# Patient Record
Sex: Male | Born: 1945
Health system: Southern US, Community
[De-identification: ages and names within clinical notes are randomized; demographics above are authoritative.]

## PROBLEM LIST (undated history)

## (undated) DIAGNOSIS — I10 Essential (primary) hypertension: Secondary | ICD-10-CM

## (undated) DIAGNOSIS — I252 Old myocardial infarction: Secondary | ICD-10-CM

## (undated) DIAGNOSIS — Z860100 Personal history of colon polyps, unspecified: Secondary | ICD-10-CM

## (undated) DIAGNOSIS — Z85828 Personal history of other malignant neoplasm of skin: Secondary | ICD-10-CM

## (undated) DIAGNOSIS — N400 Enlarged prostate without lower urinary tract symptoms: Secondary | ICD-10-CM

## (undated) DIAGNOSIS — Z8679 Personal history of other diseases of the circulatory system: Secondary | ICD-10-CM

## (undated) DIAGNOSIS — R319 Hematuria, unspecified: Secondary | ICD-10-CM

## (undated) DIAGNOSIS — E119 Type 2 diabetes mellitus without complications: Secondary | ICD-10-CM

## (undated) DIAGNOSIS — Z951 Presence of aortocoronary bypass graft: Secondary | ICD-10-CM

## (undated) DIAGNOSIS — R011 Cardiac murmur, unspecified: Secondary | ICD-10-CM

## (undated) DIAGNOSIS — N1330 Unspecified hydronephrosis: Secondary | ICD-10-CM

## (undated) DIAGNOSIS — B019 Varicella without complication: Secondary | ICD-10-CM

## (undated) DIAGNOSIS — N201 Calculus of ureter: Secondary | ICD-10-CM

## (undated) DIAGNOSIS — D4112 Neoplasm of uncertain behavior of left renal pelvis: Secondary | ICD-10-CM

## (undated) DIAGNOSIS — I2510 Atherosclerotic heart disease of native coronary artery without angina pectoris: Secondary | ICD-10-CM

## (undated) DIAGNOSIS — J302 Other seasonal allergic rhinitis: Secondary | ICD-10-CM

## (undated) DIAGNOSIS — I219 Acute myocardial infarction, unspecified: Secondary | ICD-10-CM

## (undated) DIAGNOSIS — J309 Allergic rhinitis, unspecified: Secondary | ICD-10-CM

## (undated) DIAGNOSIS — R001 Bradycardia, unspecified: Secondary | ICD-10-CM

## (undated) DIAGNOSIS — Z973 Presence of spectacles and contact lenses: Secondary | ICD-10-CM

## (undated) DIAGNOSIS — I4510 Unspecified right bundle-branch block: Secondary | ICD-10-CM

## (undated) DIAGNOSIS — E785 Hyperlipidemia, unspecified: Secondary | ICD-10-CM

## (undated) DIAGNOSIS — I509 Heart failure, unspecified: Secondary | ICD-10-CM

## (undated) DIAGNOSIS — Z9889 Other specified postprocedural states: Secondary | ICD-10-CM

## (undated) DIAGNOSIS — Z87442 Personal history of urinary calculi: Secondary | ICD-10-CM

## (undated) HISTORY — PX: WISDOM TOOTH EXTRACTION: SHX21

## (undated) HISTORY — DX: Atherosclerotic heart disease of native coronary artery without angina pectoris: I25.10

## (undated) HISTORY — PX: INGUINAL HERNIA REPAIR: SUR1180

## (undated) HISTORY — DX: Benign prostatic hyperplasia without lower urinary tract symptoms: N40.0

## (undated) HISTORY — PX: ROTATOR CUFF REPAIR: SHX139

## (undated) HISTORY — DX: Allergic rhinitis, unspecified: J30.9

## (undated) HISTORY — DX: Bradycardia, unspecified: R00.1

## (undated) HISTORY — PX: CARDIAC CATHETERIZATION: SHX172

## (undated) HISTORY — PX: CARDIAC ELECTROPHYSIOLOGY MAPPING AND ABLATION: SHX1292

## (undated) HISTORY — DX: Old myocardial infarction: I25.2

## (undated) HISTORY — PX: CORONARY ARTERY BYPASS GRAFT: SHX141

## (undated) HISTORY — DX: Essential (primary) hypertension: I10

## (undated) HISTORY — DX: Other seasonal allergic rhinitis: J30.2

## (undated) HISTORY — DX: Varicella without complication: B01.9

---

## 2001-08-04 ENCOUNTER — Encounter: Payer: Self-pay | Admitting: Internal Medicine

## 2001-08-04 DIAGNOSIS — I252 Old myocardial infarction: Secondary | ICD-10-CM | POA: Insufficient documentation

## 2001-08-04 HISTORY — DX: Old myocardial infarction: I25.2

## 2001-08-05 ENCOUNTER — Encounter: Payer: Self-pay | Admitting: Internal Medicine

## 2002-03-02 ENCOUNTER — Encounter: Payer: Self-pay | Admitting: Internal Medicine

## 2003-01-15 ENCOUNTER — Encounter: Payer: Self-pay | Admitting: Internal Medicine

## 2003-02-24 ENCOUNTER — Encounter: Payer: Self-pay | Admitting: Internal Medicine

## 2004-01-13 ENCOUNTER — Encounter: Payer: Self-pay | Admitting: Internal Medicine

## 2004-01-19 ENCOUNTER — Encounter: Payer: Self-pay | Admitting: Internal Medicine

## 2004-02-17 ENCOUNTER — Encounter: Payer: Self-pay | Admitting: Internal Medicine

## 2004-04-12 ENCOUNTER — Encounter: Payer: Self-pay | Admitting: Internal Medicine

## 2004-04-17 ENCOUNTER — Encounter: Payer: Self-pay | Admitting: Internal Medicine

## 2007-09-23 ENCOUNTER — Emergency Department (HOSPITAL_COMMUNITY): Admission: EM | Admit: 2007-09-23 | Discharge: 2007-09-23 | Payer: Self-pay | Admitting: Emergency Medicine

## 2009-02-02 ENCOUNTER — Encounter: Payer: Self-pay | Admitting: Internal Medicine

## 2009-03-05 HISTORY — PX: OTHER SURGICAL HISTORY: SHX169

## 2009-03-11 ENCOUNTER — Ambulatory Visit: Payer: Self-pay | Admitting: Internal Medicine

## 2009-03-11 DIAGNOSIS — I4892 Unspecified atrial flutter: Secondary | ICD-10-CM | POA: Insufficient documentation

## 2009-03-11 DIAGNOSIS — E785 Hyperlipidemia, unspecified: Secondary | ICD-10-CM | POA: Insufficient documentation

## 2009-03-11 DIAGNOSIS — I2510 Atherosclerotic heart disease of native coronary artery without angina pectoris: Secondary | ICD-10-CM | POA: Insufficient documentation

## 2009-03-17 ENCOUNTER — Encounter: Payer: Self-pay | Admitting: Internal Medicine

## 2009-03-23 ENCOUNTER — Encounter: Payer: Self-pay | Admitting: Internal Medicine

## 2009-03-23 ENCOUNTER — Telehealth (INDEPENDENT_AMBULATORY_CARE_PROVIDER_SITE_OTHER): Payer: Self-pay | Admitting: *Deleted

## 2009-04-08 ENCOUNTER — Encounter: Payer: Self-pay | Admitting: Internal Medicine

## 2009-04-08 ENCOUNTER — Encounter (INDEPENDENT_AMBULATORY_CARE_PROVIDER_SITE_OTHER): Payer: Self-pay | Admitting: Cardiology

## 2009-04-14 ENCOUNTER — Ambulatory Visit (HOSPITAL_COMMUNITY): Admission: RE | Admit: 2009-04-14 | Discharge: 2009-04-15 | Payer: Self-pay | Admitting: Internal Medicine

## 2009-04-14 ENCOUNTER — Ambulatory Visit: Payer: Self-pay | Admitting: Internal Medicine

## 2009-04-18 ENCOUNTER — Telehealth: Payer: Self-pay | Admitting: Internal Medicine

## 2009-05-18 ENCOUNTER — Ambulatory Visit: Payer: Self-pay | Admitting: Internal Medicine

## 2009-05-18 DIAGNOSIS — I10 Essential (primary) hypertension: Secondary | ICD-10-CM | POA: Insufficient documentation

## 2009-05-18 HISTORY — DX: Essential (primary) hypertension: I10

## 2010-04-04 NOTE — Letter (Signed)
Summary: ELectrophysiology/Ablation Procedure Instructions  [employer], Main Office  [address]   [place], [place] [address]   Phone: [phone]  Fax: [phone]     Electrophysiology/Ablation Procedure Instructions    You are scheduled for a(n) atrial flutter ablation on 04/14/09 at 7:30am with Dr. Johney Frame  1.  Please come to the Short Stay Center at [facility] at 6:00am on the day of your procedure.  2.  Come prepared to stay overnight.   Please bring your insurance cards and a list of your medications.  3.  Come to the [place] office on 04/07/09 for lab work.  The lab at [facility] is open from 8:30 AM to 1:30 PM and 2:30 PM to 5:00 PM.    You do not have to be fasting.  4.  Do not have anything to eat or drink after midnight the night before your procedure.  5.    All of your remaining medications may be taken with a small amount of water.  6.  Educational material received:  _____ EP   _____ Ablation   * Occasionally, EP studies and ablations can become lengthy.  Please make your family aware of this before your procedure starts.  Average time ranges from 2-8 hours for EP studies/ablations.  Your physician will locate your family after the procedure with the results.  * If you have any questions after you get home, please call the office at [phone]. Anselm Pancoast

## 2010-04-04 NOTE — Progress Notes (Signed)
Summary: Questions about going to the gym.  Phone Note Call from Patient Call back at Home Phone [phone]   Caller: Patient Summary of Call: Pt have questions about  going to the gym to excrise Initial call taken by: Judie Grieve,  April 18, 2009 11:23 AM  Follow-up for Phone Call        spoke with Dr Johney Frame  He wants him to wait one more week. Dennis Bast, RN, BSN  April 19, 2009 4:00 PM spoke with pt he is aware  Dennis Bast, RN, BSN  April 19, 2009 4:10 PM

## 2010-04-04 NOTE — Assessment & Plan Note (Signed)
Summary: nep/atrial flutter/afib/recurrant/ablation in 2003   Visit Type:  Initial Consult Referring Provider:  Everette Rank Primary Provider:  Carlyon Shadow, MD  CC:  nep/recurrent atrial flutter/ablation 2003.  Had symptoms a month or so ago.  Has symptoms with exercise.  History of Present Illness: Donald [name] is a pleasant 65 yo WM with CAD s/p CABG who presents today for EP consultation regarding atrial flutter.  He reports initially being diagnosed with atrial flutter in November.  He reports at that time, finding that his heart rate was "too high".  He reports symptomatic "heart racing" and fatigue.  He presenting to Dr Hoyle Barr office and was found to have atrial flutter.  He was placed on metoprolol and coumadin and is now referred for consideration of ablation.  He reports that despite medical therapy, he continues to have fatigue and palpitations.  He has found decreased exercise tolerance.  He denies CP, SOB, dizziness, presyncope, or syncope.  He is otherwise without complaint today.  Current Medications (verified): 1)  Vitamin B-12 250 Mcg Tabs (Cyanocobalamin) .... Take One Tablet Once Daily 2)  Vitamin C 500 Mg Tabs (Ascorbic Acid) .... Take One Tablet Once Daily 3)  Aspirin 81 Mg Tabs (Aspirin) .... Once Daily 4)  Multivitamins  Tabs (Multiple Vitamin) .... Once Daily 5)  Ra Fish Oil 1000 Mg Caps (Omega-3 Fatty Acids) .Marland Kitchen.. 1 Capsule Once Daily 6)  Metoprolol Tartrate 50 Mg Tabs (Metoprolol Tartrate) .... Take One Tablet Two Times A Day 7)  Simvastatin 20 Mg Tabs (Simvastatin) .... Take One Tablet Every Once A Day  Allergies (verified): No Known Drug Allergies  Past History:  Social History: Last updated: 03/11/2009 Pt lives in [place].  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  Moved to [place] to be near children/ grandchildren. Alcohol Use - no Regular Exercise - yes Married- 2 children Tobacco Use - Yes.  Smokeless Tobacco  Past Medical  History: Coronary artery disease s/p CABG following MI 07/2001 (in [place]) Per report, atrial flutter ablation 2005 HL DJD  Past Surgical History: CABG 2003 Ablation 2005 R hand ORIF 2009 for arthritis shoulder surgery 04, 07  Family History: CAD  Social History: Pt lives in [place].  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  Moved to [place] to be near children/ grandchildren. Alcohol Use - no Regular Exercise - yes Married- 2 children Tobacco Use - Yes.  Smokeless Tobacco  Review of Systems       All systems are reviewed and negative except as listed in the HPI.   Vital Signs:  Patient profile:   65 year old male Height:      64 inches Weight:      180 pounds BMI:     31.01 Pulse rate:   103 / minute BP sitting:   126 / 74  (left arm) Cuff size:   regular  Vitals Entered By: Donald [name] CMA (March 11, 2009 8:52 AM)  Physical Exam  General:  Well developed, well nourished, in no acute distress. Head:  normocephalic and atraumatic Eyes:  PERRLA/EOM intact; conjunctiva and lids normal. Nose:  no deformity, discharge, inflammation, or lesions Mouth:  Teeth, gums and palate normal. Oral mucosa normal. Neck:  Neck supple, no JVD. No masses, thyromegaly or abnormal cervical nodes. Lungs:  Clear bilaterally to auscultation and percussion. Heart:  Non-displaced PMI, chest non-tender; regular rate and rhythm, S1, S2 without murmurs, rubs or gallops. Carotid upstroke normal, no bruit. Normal abdominal aortic size, no bruits.  Femorals normal pulses, no bruits. Pedals normal pulses. No edema, no varicosities. Abdomen:  Bowel sounds positive; abdomen soft and non-tender without masses, organomegaly, or hernias noted. No hepatosplenomegaly. Msk:  Back normal, normal gait. Muscle strength and tone normal. Pulses:  pulses normal in all 4 extremities Extremities:  No clubbing or cyanosis. Neurologic:  Alert and oriented x 3. Skin:  Intact without lesions or  rashes. Cervical Nodes:  no significant adenopathy Psych:  Normal affect.   EKG  Procedure date:  03/11/2009  Findings:      atrial flutter (typical appearing), RBBB, nonspecific ST/T changes V rate 103  Impression & Recommendations:  Problem # 1:  ATRIAL FLUTTER (ICD-427.32) The patient presents today for EP consultation regarding typical appearing, symptomatic atrial flutter.  Therapeutic strategies for atrial flutter including medicine and ablation were discussed in detail with the patient today. Risk, benefits, and alternatives to EP study and radiofrequency ablation were also discussed in detail today. These risks include but are not limited to stroke, bleeding, vascular damage, tamponade, perforation, damage to normal conduction requiring a pacemaker, and death. The patient understands these risk and wishes to proceed.   The patient (per report) has had prior ablation.  We will attempt to obtain records from [facility].  I will also plan to perform 3 D mapping with carto to evaluate his atrial flutter.  We will proceed once INRs have been therapeutic for 4 wks.  Problem # 2:  CAD (ICD-414.00)  No sypmtoms of ischemia No changes today His updated medication list for this problem includes:    Aspirin 81 Mg Tabs (Aspirin) ..... Once daily    Metoprolol Tartrate 50 Mg Tabs (Metoprolol tartrate) .Marland Kitchen... Take one tablet two times a day  His updated medication list for this problem includes:    Aspirin 81 Mg Tabs (Aspirin) ..... Once daily    Metoprolol Tartrate 50 Mg Tabs (Metoprolol tartrate) .Marland Kitchen... Take one tablet two times a day  Problem # 3:  HYPERLIPIDEMIA (ICD-272.4)  stable, no changes  His updated medication list for this problem includes:    Simvastatin 20 Mg Tabs (Simvastatin) .Marland Kitchen... Take one tablet every once a day  His updated medication list for this problem includes:    Simvastatin 20 Mg Tabs (Simvastatin) .Marland Kitchen... Take one tablet every once a day  Patient  Instructions: 1)  Your physician has recommended that you have an ablation.  Catheter ablation is a medical procedure used to treat some cardiac arrhythmias (irregular heartbeats). During catheter ablation, a long, thin, flexible tube is put into a blood vessel in your groin (upper thigh), or neck. This tube is called an ablation catheter. It is then guided to your heart through the blood vessel. Radiofrequency waves destroy small areas of heart tissue where abnormal heartbeats may cause an arrhythmia to start.  Please see the instruction sheet given to you today. 2)  Your physician recommends that you CONTINUE TO HAVE INR CHECKS WEEKLY AND YOUR INR NEEDS TO REMAIN BETWEEN 2 AND 3 DURING THIS TIME. 3)  CONTACT KELLY, RN TO SCHEDULE YOUR ABLATION ONCE YOUR HAVE HAD 3 CONSECUTIVE INR'S IN THE SPECIFIED RANGE.

## 2010-04-04 NOTE — Letter (Signed)
Summary: [facility]  [facility]   Imported By: Kassie Mends 04/27/2009 07:56:52  _____________________________________________________________________  External Attachment:    Type:   Image     Comment:   External Document

## 2010-04-04 NOTE — Cardiovascular Report (Signed)
Summary: [facility]   Imported By: Marylou Mccoy 04/13/2009 14:10:43  _____________________________________________________________________  External Attachment:    Type:   Image     Comment:   External Document

## 2010-04-04 NOTE — Progress Notes (Signed)
  FAxed ROI over 1/7, also called and left Message for [facility] @ front Desk asking that Records befaxed over or Mailed out. Cala Bradford Mesiemore  March 23, 2009 2:01 PM    Appended Document:  Recieved Records today, Forwarded to [place] for [place].  Appended Document:  Recieved REcords from [facility] in [place]

## 2010-04-04 NOTE — Letter (Signed)
Summary: [facility]   Imported By: Marylou Mccoy 04/13/2009 15:19:24  _____________________________________________________________________  External Attachment:    Type:   Image     Comment:   External Document

## 2010-04-04 NOTE — Assessment & Plan Note (Signed)
Summary: Donald [name]   Visit Type:  Follow-up Referring Provider:  Everette Rank Primary Provider:  Carlyon Shadow, MD   History of Present Illness: The patient presents today for routine electrophysiology followup. He reports doing very well since his atrial flutter ablation. The patient denies symptoms of palpitations, chest pain, shortness of breath, orthopnea, PND, lower extremity edema, dizziness, presyncope, syncope, or neurologic sequela.  He has had no further symptoms of atrial flutter.  He has resumed his previous activities without difficulty. The patient is tolerating medications without difficulties and is otherwise without complaint today.   Current Medications (verified): 1)  Vitamin B-12 250 Mcg Tabs (Cyanocobalamin) .... Take One Tablet Once Daily 2)  Vitamin C 500 Mg Tabs (Ascorbic Acid) .... Take One Tablet Once Daily 3)  Aspirin 81 Mg Tabs (Aspirin) .... Once Daily 4)  Multivitamins  Tabs (Multiple Vitamin) .... Once Daily 5)  Ra Fish Oil 1000 Mg Caps (Omega-3 Fatty Acids) .Marland Kitchen.. 1 Capsule Once Daily 6)  Metoprolol Tartrate 25 Mg Tabs (Metoprolol Tartrate) .... Take One Tablet By Mouth Twice A Day 7)  Simvastatin 20 Mg Tabs (Simvastatin) .... Take One Tablet Once A Day  Allergies (verified): No Known Drug Allergies  Past History:  Past Medical History: Coronary artery disease s/p CABG following MI 07/2001 (in [place]) Isthmus dependant RA flutter s/p CTI ablation 2005, 04/14/09 HTN HL DJD  Past Surgical History: CABG 2003 Ablation 2005, 2010 R hand ORIF 2009 for arthritis shoulder surgery 04, 07  Social History: Reviewed history from 03/11/2009 and no changes required. Pt lives in [place].  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  Moved to [place] to be near children/ grandchildren. Alcohol Use - no Regular Exercise - yes Married- 2 children Tobacco Use - Yes.  Smokeless Tobacco  Review of Systems       All systems are reviewed and negative  except as listed in the HPI.   Vital Signs:  Patient profile:   65 year old male Height:      64 inches Weight:      180 pounds BMI:     31.01 Pulse rate:   48 / minute BP sitting:   140 / 66  (left arm)  Vitals Entered By: Laurance Flatten CMA (May 18, 2009 8:48 AM)  Physical Exam  General:  Well developed, well nourished, in no acute distress. Head:  normocephalic and atraumatic Eyes:  PERRLA/EOM intact; conjunctiva and lids normal. Mouth:  Teeth, gums and palate normal. Oral mucosa normal. Neck:  Neck supple, no JVD. No masses, thyromegaly or abnormal cervical nodes. Lungs:  Clear bilaterally to auscultation and percussion. Heart:  Non-displaced PMI, chest non-tender; regular rate and rhythm, S1, S2 without murmurs, rubs or gallops. Carotid upstroke normal, no bruit. Normal abdominal aortic size, no bruits. Femorals normal pulses, no bruits. Pedals normal pulses. No edema, no varicosities. Abdomen:  Bowel sounds positive; abdomen soft and non-tender without masses, organomegaly, or hernias noted. No hepatosplenomegaly. Msk:  Back normal, normal gait. Muscle strength and tone normal. Pulses:  pulses normal in all 4 extremities Extremities:  No clubbing or cyanosis. Neurologic:  Alert and oriented x 3. Skin:  Intact without lesions or rashes. Psych:  Normal affect.   EKG  Procedure date:  05/18/2009  Findings:      sinus bradycardia 48 bpm, incomplete RBBB  Impression & Recommendations:  Problem # 1:  ATRIAL FLUTTER (ICD-427.32) Doing well s/p ablation without futher atrial flutter We will stop coumadin today If he develops atrial  fibrillation or further atrial flutters, then coumadin should be restarted at that time.  His updated medication list for this problem includes:    Aspirin 81 Mg Tabs (Aspirin) ..... Once daily    Metoprolol Tartrate 25 Mg Tabs (Metoprolol tartrate) .Marland Kitchen... Take one tablet by mouth twice a day  Problem # 2:  CAD (ICD-414.00)  Doing well  without symptoms of ischemia  His updated medication list for this problem includes:    Aspirin 81 Mg Tabs (Aspirin) ..... Once daily    Metoprolol Tartrate 25 Mg Tabs (Metoprolol tartrate) .Marland Kitchen... Take one-half tablet by mouth twice a day  His updated medication list for this problem includes:    Aspirin 81 Mg Tabs (Aspirin) ..... Once daily    Metoprolol Tartrate 25 Mg Tabs (Metoprolol tartrate) .Marland Kitchen... Take one-half tablet by mouth twice a day  Problem # 3:  HYPERTENSION, MILD (ICD-401.1)  repeat today 130/78 we will decrease metoprolol to 12.5mg  two times a day today due to bradycardia Pt will follow-up with Dr Eldridge Dace  His updated medication list for this problem includes:    Aspirin 81 Mg Tabs (Aspirin) ..... Once daily    Metoprolol Tartrate 25 Mg Tabs (Metoprolol tartrate) .Marland Kitchen... Take one-half tablet by mouth twice a day  His updated medication list for this problem includes:    Aspirin 81 Mg Tabs (Aspirin) ..... Once daily    Metoprolol Tartrate 25 Mg Tabs (Metoprolol tartrate) .Marland Kitchen... Take one-half tablet by mouth twice a day  Other Orders: EKG w/ Interpretation (93000)  Patient Instructions: 1)  Your physician recommends that you schedule a follow-up appointment with Dr Eldridge Dace 2)  Your physiician has recommended you make the following change in your medication: stop Coumadin decrease Metoprolol to 12.5mg  two times a day

## 2010-04-04 NOTE — Op Note (Signed)
Summary: [facility]   Imported By: Marylou Mccoy 04/13/2009 12:22:48  _____________________________________________________________________  External Attachment:    Type:   Image     Comment:   External Document

## 2010-05-24 LAB — PROTIME-INR
INR: 2.79 — ABNORMAL HIGH (ref 0.00–1.49)
INR: 2.89 — ABNORMAL HIGH (ref 0.00–1.49)
Prothrombin Time: 29.2 seconds — ABNORMAL HIGH (ref 11.6–15.2)
Prothrombin Time: 30 seconds — ABNORMAL HIGH (ref 11.6–15.2)

## 2010-12-01 LAB — URINALYSIS, ROUTINE W REFLEX MICROSCOPIC
Bilirubin Urine: NEGATIVE
Glucose, UA: NEGATIVE
Hgb urine dipstick: NEGATIVE
Ketones, ur: NEGATIVE
Nitrite: NEGATIVE
Protein, ur: NEGATIVE
Specific Gravity, Urine: 1.016
Urobilinogen, UA: 0.2
pH: 5.5

## 2010-12-01 LAB — BASIC METABOLIC PANEL
BUN: 13
CO2: 27
Calcium: 9.3
Chloride: 105
Creatinine, Ser: 0.91
GFR calc Af Amer: >60
GFR calc non Af Amer: >60
Glucose, Bld: 107 — ABNORMAL HIGH
Potassium: 4.6
Sodium: 141

## 2010-12-01 LAB — CBC
HCT: 40.9
Hemoglobin: 13.9
MCHC: 34
MCV: 90.4
Platelets: 160
RBC: 4.53
RDW: 13.2
WBC: 5.3

## 2010-12-01 LAB — CK: Total CK: 161

## 2010-12-01 LAB — DIFFERENTIAL
Basophils Absolute: 0.1
Basophils Relative: 2 — ABNORMAL HIGH
Eosinophils Absolute: 0.2
Eosinophils Relative: 4
Lymphocytes Relative: 35
Lymphs Abs: 1.9
Monocytes Absolute: 0.5
Monocytes Relative: 10
Neutro Abs: 2.6
Neutrophils Relative %: 49

## 2010-12-01 LAB — TROPONIN I: Troponin I: <0.01

## 2013-03-26 ENCOUNTER — Encounter: Payer: Self-pay | Admitting: Family Medicine

## 2013-03-26 ENCOUNTER — Ambulatory Visit (INDEPENDENT_AMBULATORY_CARE_PROVIDER_SITE_OTHER): Payer: Medicare HMO | Admitting: Family Medicine

## 2013-03-26 VITALS — BP 120/70 | Temp 98.1°F | Ht 64.0 in | Wt 184.0 lb

## 2013-03-26 DIAGNOSIS — I10 Essential (primary) hypertension: Secondary | ICD-10-CM

## 2013-03-26 DIAGNOSIS — N400 Enlarged prostate without lower urinary tract symptoms: Secondary | ICD-10-CM

## 2013-03-26 DIAGNOSIS — M722 Plantar fascial fibromatosis: Secondary | ICD-10-CM

## 2013-03-26 DIAGNOSIS — E785 Hyperlipidemia, unspecified: Secondary | ICD-10-CM

## 2013-03-26 DIAGNOSIS — Z7189 Other specified counseling: Secondary | ICD-10-CM

## 2013-03-26 DIAGNOSIS — Z7689 Persons encountering health services in other specified circumstances: Secondary | ICD-10-CM

## 2013-03-26 DIAGNOSIS — I2510 Atherosclerotic heart disease of native coronary artery without angina pectoris: Secondary | ICD-10-CM

## 2013-03-26 NOTE — Patient Instructions (Signed)
-  PLEASE SIGN UP FOR MYCHART TODAY   We recommend the following healthy lifestyle measures: - eat a healthy diet consisting of lots of vegetables, fruits, beans, nuts, seeds, healthy meats such as white chicken and fish and whole grains.  - avoid fried foods, fast food, processed foods, sodas, red meet and other fattening foods.  - get a least 150 minutes of aerobic exercise per week.   Rickey Barbara for your plantar fasciitis  Follow up in: 6 months and as needed

## 2013-03-26 NOTE — Progress Notes (Signed)
Chief Complaint  Patient presents with  . Establish Care    HPI:  Griffen Frayne is here to establish care. Old practice quit taking his insurance. Last PCP and physical:  Has the following chronic problems and concerns today:  URI: -started 1 week ago -symptoms: drainage, cough, sore throat, nasal congestion, chills, fatigue - improving but still on 100% -denies: ear pain, sinus pain, SOB, NVD, fever -denies: flu exposure -salt water gargles, OTC cold medication  Patient Active Problem List   Diagnosis Date Noted  . BPH (benign prostatic hyperplasia) - followed by urologist, Dr. Venia Minks in [place] 03/26/2013  . HYPERTENSION, MILD 05/18/2009  . HYPERLIPIDEMIA 03/11/2009  . CAD, followed by cardiologist 03/11/2009  . ATRIAL FLUTTER 03/11/2009   BPH: -sees urologist, Dr. Venia Minks in [place] -has elevated PSA in the past, just had this done with urologist  CAD, A. Flutter, HLD, HTN: -s/p multiple bypass surgeries in the past -sees cardiologist for this - [facility] per [facility] for physical and some of his medications.  Health Maintenance: -reports had tdap 7 years ago  ROS: See pertinent positives and negatives per HPI.  Past Medical History  Diagnosis Date  . Chicken pox   . Seasonal allergies   . Heart disease   . High cholesterol   . Colon polyp   . Hypertension   . BPH (benign prostatic hyperplasia)     Family History  Problem Relation Age of Onset  . Cancer Father     throat cancer, chewing tobacco    History   Social History  . Marital Status: Married    Spouse Name: N/A    Number of Children: N/A  . Years of Education: N/A   Social History Main Topics  . Smoking status: Never Smoker   . Smokeless tobacco: None  . Alcohol Use: Yes     Comment: occ.   . Drug Use: None  . Sexual Activity: None   Other Topics Concern  . 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; Insurance account manager Situation: lives with wife      Spiritual Beliefs: Christian      Lifestyle: tries to make it to the gym 5 days per week; healthy diet             Current outpatient prescriptions:aspirin 81 MG tablet, Take 81 mg by mouth daily., Disp: , Rfl: ;  atorvastatin (LIPITOR) 80 MG tablet, Take 80 mg by mouth daily., Disp: , Rfl: ;  finasteride (PROSCAR) 5 MG tablet, Take 5 mg by mouth daily., Disp: , Rfl: ;  ibuprofen (ADVIL,MOTRIN) 600 MG tablet, Take 600 mg by mouth every 6 (six) hours as needed., Disp: , Rfl:  metoprolol tartrate (LOPRESSOR) 25 MG tablet, Take 25 mg by mouth 2 (two) times daily., Disp: , Rfl: ;  Multiple Vitamin (MULTIVITAMIN) tablet, Take 1 tablet by mouth daily., Disp: , Rfl: ;  Omega-3 Fatty Acids (FISH OIL) 1000 MG CAPS, Take by mouth., Disp: , Rfl: ;  Red Yeast Rice Extract (RED YEAST RICE PO), Take by mouth., Disp: , Rfl: ;  vitamin C (ASCORBIC ACID) 500 MG tablet, Take 500 mg by mouth daily., Disp: , Rfl:   EXAM:  Filed Vitals:   03/26/13 1116  BP: 120/70  Temp: 98.1 F (36.7 C)    Body mass index is 31.57 kg/(m^2).  GENERAL: vitals reviewed and listed above, alert,  oriented, appears well hydrated and in no acute distress  HEENT: atraumatic, conjunttiva clear, no obvious abnormalities on inspection of external nose and ears, normal appearance of ear canals and TMs, clear nasal congestion, mild post oropharyngeal erythema with PND, no tonsillar edema or exudate, no sinus TTP  NECK: no obvious masses on inspection  LUNGS: clear to auscultation bilaterally, no wheezes, rales or rhonchi, good air movement  CV: HRRR, no peripheral edema  MS: moves all extremities without noticeable abnormality  PSYCH: pleasant and cooperative, no obvious depression or anxiety  ASSESSMENT AND PLAN:  Discussed the following assessment and plan:  Encounter to establish care  BPH (benign prostatic hyperplasia) - followed by urologist, Dr. Venia Minks in  [place], MILD  CAD, followed by cardiologist  Plantar fasciitis  -We reviewed the PMH, PSH, FH, SH, Meds and Allergies. -We provided refills for any medications we will prescribe as needed. -We addressed current concerns per orders and patient instructions. -We have asked for records for pertinent exams, studies, vaccines and notes from previous providers. -We have advised patient to follow up per instructions below. -gets his labs done with the [facility] and he will forward these to me when he gets these -[place] sock for plantar fasciitis -has VURI - supportive care advised for this -follow up in 6 months and as needed   -Patient advised to return or notify a doctor immediately if symptoms worsen or persist or new concerns arise.  Patient Instructions  -PLEASE SIGN UP FOR MYCHART TODAY   We recommend the following healthy lifestyle measures: - eat a healthy diet consisting of lots of vegetables, fruits, beans, nuts, seeds, healthy meats such as white chicken and fish and whole grains.  - avoid fried foods, fast food, processed foods, sodas, red meet and other fattening foods.  - get a least 150 minutes of aerobic exercise per week.   Rickey Barbara for your plantar fasciitis  Follow up in: 6 months and as needed      Laketa Sandoz R.

## 2013-04-08 ENCOUNTER — Telehealth: Payer: Self-pay | Admitting: Family Medicine

## 2013-04-08 NOTE — Telephone Encounter (Signed)
Relevant patient education mailed to patient.  

## 2013-05-11 ENCOUNTER — Encounter: Payer: Self-pay | Admitting: Cardiology

## 2013-05-11 ENCOUNTER — Encounter: Payer: Self-pay | Admitting: Interventional Cardiology

## 2013-05-11 ENCOUNTER — Ambulatory Visit (INDEPENDENT_AMBULATORY_CARE_PROVIDER_SITE_OTHER): Payer: Medicare HMO | Admitting: Interventional Cardiology

## 2013-05-11 VITALS — BP 124/68 | HR 48 | Ht 65.0 in | Wt 182.0 lb

## 2013-05-11 DIAGNOSIS — I2510 Atherosclerotic heart disease of native coronary artery without angina pectoris: Secondary | ICD-10-CM

## 2013-05-11 DIAGNOSIS — Z79899 Other long term (current) drug therapy: Secondary | ICD-10-CM

## 2013-05-11 DIAGNOSIS — I4892 Unspecified atrial flutter: Secondary | ICD-10-CM

## 2013-05-11 DIAGNOSIS — E785 Hyperlipidemia, unspecified: Secondary | ICD-10-CM

## 2013-05-11 NOTE — Patient Instructions (Signed)
Your physician recommends that you continue on your current medications as directed. Please refer to the Current Medication list given to you today.  Your physician recommends that you return for lab work October 12, 2013.  Your physician wants you to follow-up in: 1 year with Dr. Irish Lack. You will receive a reminder letter in the mail two months in advance. If you don't receive a letter, please call our office to schedule the follow-up appointment.

## 2013-05-11 NOTE — Progress Notes (Signed)
Patient ID: Donald [name], male   DOB: 07-03-1945, 68 y.o.   MRN: [id number]    Donald [name], Donald [name], [place]  [address] Phone: [phone] Fax:  [phone]  Date:  [name]   ID:  Donald [name], DOB 04-14-1945, MRN [id number]  PCP:  Lucretia Kern., DO      History of Present Illness: Donald [name] is a 68 y.o. male who has CAD and a h/o AFlutter. He had an ablation for atrial flutter in 2011, and several years before that in [place]. He had high heart rates with minimal exercise but this has resolved. He is off of coumadin. He exercises 5-6x/week, mostly weights. He wants to increase cardio exercises. Cardio limited by right knee pain. CAD/ASCVD:  Denies : Chest pain.  Diaphoresis.  Dizziness.  Dyspnea on exertion.  Fatigue.  Leg edema.  Nitroglycerin.  Orthopnea.  Palpitations.  Syncope.     Wt Readings from Last 3 Encounters:  05/11/13 182 lb (82.555 kg)  03/26/13 184 lb (83.462 kg)  05/18/09 180 lb (81.647 kg)     Past Medical History  Diagnosis Date  . Chicken pox   . Seasonal allergies   . Heart disease   . Colon polyp   . Hypertension   . BPH (benign prostatic hyperplasia)   . Coronary artery disease   . Allergic rhinitis   . High cholesterol     goal LDL less than 70  . Obesity   . Chronic wrist pain   . Arrhythmia     hx of arrythmia resovled after ablation  . History of basal cell carcinoma     left upper back, right chest   . History of MI (myocardial infarction)     2004    Current Outpatient Prescriptions  Medication Sig Dispense Refill  . aspirin 81 MG tablet Take 81 mg by mouth daily.      Marland Kitchen atorvastatin (LIPITOR) 80 MG tablet Take 40 mg by mouth daily.       . finasteride (PROSCAR) 5 MG tablet Take 5 mg by mouth daily.      Marland Kitchen ibuprofen (ADVIL,MOTRIN) 600 MG tablet Take 600 mg by mouth every 6 (six) hours as needed.      . metoprolol tartrate (LOPRESSOR) 25 MG tablet Take 25 mg by mouth daily.       . Multiple Vitamin (MULTIVITAMIN)  tablet Take 1 tablet by mouth daily.      . Omega-3 Fatty Acids (FISH OIL) 1000 MG CAPS Take by mouth.      . Red Yeast Rice Extract (RED YEAST RICE PO) Take by mouth.      . vitamin C (ASCORBIC ACID) 500 MG tablet Take 500 mg by mouth daily.       No current facility-administered medications for this visit.    Allergies:   No Known Allergies  Social History:  The patient  reports that he has never smoked. He does not have any smokeless tobacco history on file. He reports that he drinks alcohol. He reports that he does not use illicit drugs.   Family History:  The patient's family history includes Cancer in his father.   ROS:  Please see the history of present illness.  No nausea, vomiting.  No fevers, chills.  No focal weakness.  No dysuria. Knee pain.   All other systems reviewed and negative.   PHYSICAL EXAM: VS:  BP 124/68  Pulse 48  Ht 5\' 5"  (1.651 m)  Wt 182  lb (82.555 kg)  BMI 30.29 kg/m2 Well nourished, well developed, in no acute distress HEENT: normal Neck: no JVD, no carotid bruits Cardiac:  normal S1, S2; bradycardic Lungs:  clear to auscultation bilaterally, no wheezing, rhonchi or rales Abd: soft, nontender, no hepatomegaly Ext: no edema Skin: warm and dry Neuro:   no focal abnormalities noted  EKG:  Sinus bradyardic, RBBB, unchanged     ASSESSMENT AND PLAN:  Coronary atherosclerosis of native coronary artery  Continue Aspirin Tablet Chewable, 81 MG, 1, Orally, qd      Notes: okay to use ibuprofen on a prn basis, up to 3 x /month. He does not use it everyday. Increase cardio exercise. No angina.  2. Atrial flutter  Notes: Maintaining NSR. Off of coumadin post ablation x 2, last in 2011 with Dr. Rayann Heman.  He was symptomatic at that time with tachycardia.  3. Hyperlipidemia  Notes[facility] 40 mg [facility]. Will check statin panel 2 months after the switch. TC 126, TG 51, HDL 47, LDL 69 on 04/11/12.  Labs in 2/15 reviewed showed well controled  lipids, liver, kidneys, thyroid, vit D and B12, and Hbg.  LDL 61.  Normal LFTs.  WIll check LFTs in Aug 2015 due to statin + red yeast rice. 4. Hematuria, unspecified  Notes: 11 RBC on u/a micro at [place].  repeat U/A with PMD in 2015 shows this has resolved.  5. HTN: BP controlled.  Signed, Mina Marble, MD, [facility] [name] 8:54 AM

## 2013-07-09 ENCOUNTER — Ambulatory Visit: Payer: Self-pay | Admitting: Interventional Cardiology

## 2013-09-24 ENCOUNTER — Encounter: Payer: Self-pay | Admitting: Family Medicine

## 2013-09-24 ENCOUNTER — Ambulatory Visit (INDEPENDENT_AMBULATORY_CARE_PROVIDER_SITE_OTHER): Payer: Medicare HMO | Admitting: Family Medicine

## 2013-09-24 VITALS — BP 128/80 | HR 52 | Temp 97.5°F | Ht 65.0 in | Wt 181.5 lb

## 2013-09-24 DIAGNOSIS — R7309 Other abnormal glucose: Secondary | ICD-10-CM

## 2013-09-24 DIAGNOSIS — E785 Hyperlipidemia, unspecified: Secondary | ICD-10-CM

## 2013-09-24 DIAGNOSIS — N400 Enlarged prostate without lower urinary tract symptoms: Secondary | ICD-10-CM

## 2013-09-24 DIAGNOSIS — I10 Essential (primary) hypertension: Secondary | ICD-10-CM

## 2013-09-24 DIAGNOSIS — Z23 Encounter for immunization: Secondary | ICD-10-CM

## 2013-09-24 DIAGNOSIS — R7303 Prediabetes: Secondary | ICD-10-CM

## 2013-09-24 NOTE — Addendum Note (Signed)
Addended by: Agnes Lawrence on: 09/24/2013 10:53 AM   Modules accepted: Orders

## 2013-09-24 NOTE — Patient Instructions (Signed)
We recommend the following healthy lifestyle measures: - eat a healthy diet consisting of lots of vegetables, fruits, beans, nuts, seeds, healthy meats such as white chicken and fish and whole grains.  - avoid fried foods, fast food, processed foods, sodas, red meet and other fattening foods.  - get a least 150 minutes of aerobic exercise per week.   Particularly watch the carbohydrates and sugar in your diet.  Please get your flu shot in October  Let us know if you decide you want to check your liver test  Follow up in 4- 6 months for your [place]

## 2013-09-24 NOTE — Progress Notes (Signed)
Pre visit review using our clinic review tool, if applicable. No additional management support is needed unless otherwise documented below in the visit note. 

## 2013-09-24 NOTE — Progress Notes (Signed)
No chief complaint on file.   HPI:  Sees primary care at [facility] for yearly physical and labs.  BPH:  -sees urologist, Dr. Venia Minks in [place]  -has elevated PSA in the past - sees urologist yearly  CAD, A. Flutter, HLD, HTN:  -s/p multiple bypass surgeries in the past  -s/p ablation in 2011 -sees cardiologist for this - [facility] for physical and some of his medications -denies: CP, DOE, SOB, swelling - had labs at [facility] and all were good 04/2013 - normal cholesterol, PSA, CMP, vit Db, CBC. HgbA1c 6.1 -getting regular exercise and eating healthy  Plantar fasciitis: -advised [place] sock -reports: doing much better  ROS: See pertinent positives and negatives per HPI.  Past Medical History  Diagnosis Date  . Chicken pox   . Seasonal allergies   . Heart disease   . Colon polyp   . Hypertension   . BPH (benign prostatic hyperplasia)   . Coronary artery disease   . Allergic rhinitis   . High cholesterol     goal LDL less than 70  . Obesity   . Chronic wrist pain   . Arrhythmia     hx of arrythmia resovled after ablation  . History of basal cell carcinoma     left upper back, right chest   . History of MI (myocardial infarction)     2004    Past Surgical History  Procedure Laterality Date  . Coronary artery bypass graft    . Rotator cuff repair    . Wrist surgery      Family History  Problem Relation Age of Onset  . Cancer Father     throat cancer, chewing tobacco    History   Social History  . Marital Status: Married    Spouse Name: N/A    Number of Children: N/A  . Years of Education: N/A   Social History Main Topics  . Smoking status: Never Smoker   . Smokeless tobacco: None  . Alcohol Use: Yes     Comment: occ.   . Drug Use: No  . Sexual Activity: None   Other Topics Concern  . 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 , Disp: , Rfl: ;  finasteride (PROSCAR) 5 MG tablet, Take 5 mg by mouth daily., Disp: , Rfl: ;  metoprolol tartrate (LOPRESSOR) 25 MG tablet, Take 25 mg by mouth daily. , Disp: , Rfl: ;  Multiple Vitamin (MULTIVITAMIN) tablet, Take 1 tablet by mouth daily., Disp: , Rfl:  Omega-3 Fatty Acids (FISH OIL) 1000 MG CAPS, Take by mouth., Disp: , Rfl: ;  Red Yeast Rice Extract (RED YEAST RICE PO), Take by mouth., Disp: , Rfl: ;  vitamin C (ASCORBIC ACID) 500 MG tablet, Take 500 mg by mouth daily., Disp: , Rfl:   EXAM:  Filed Vitals:   09/24/13 1022  BP: 128/80  Pulse: 52  Temp: 97.5 F (36.4 C)  Body mass index is 30.2 kg/(m^2).  GENERAL: vitals reviewed and listed above, alert, oriented, appears well hydrated and in no acute distress  HEENT: atraumatic, conjunttiva clear, no obvious abnormalities on inspection of external nose and ears  NECK: no obvious masses on inspection  LUNGS: clear to auscultation bilaterally, no wheezes, rales or rhonchi, good air movement  CV: HRRR, no peripheral edema  MS: moves all extremities without noticeable abnormality  PSYCH: pleasant and cooperative, no obvious depression or anxiety  ASSESSMENT AND PLAN:  Discussed the following assessment and plan:  HYPERLIPIDEMIA  HYPERTENSION, MILD  BPH (benign prostatic hyperplasia) - followed by urologist, Dr. Venia Minks in [place]  Prediabetes  -stable -doing well -discuss rechecking LFTs per note from cardiology - but her reports has been on same cholesterol medications for a while and for 6 months prior to LFTs in feb which were normal and he opted not to do this now -follow up 6 months -prevnar 13 today -Patient  advised to return or notify a doctor immediately if symptoms worsen or persist or new concerns arise.  Patient Instructions  We recommend the following healthy lifestyle measures: - eat a healthy diet consisting of lots of vegetables, fruits, beans, nuts, seeds, healthy meats such as white chicken and fish and whole grains.  - avoid fried foods, fast food, processed foods, sodas, red meet and other fattening foods.  - get a least 150 minutes of aerobic exercise per week.   Particularly watch the carbohydrates and sugar in your diet.  Please get your flu shot in October  Let us know if you decide you want to check your liver test  Follow up in 4- 6 months for your [place], [place]

## 2013-10-12 ENCOUNTER — Other Ambulatory Visit: Payer: Medicare HMO

## 2014-01-08 ENCOUNTER — Ambulatory Visit: Payer: Medicare HMO | Admitting: *Deleted

## 2014-01-12 ENCOUNTER — Ambulatory Visit (INDEPENDENT_AMBULATORY_CARE_PROVIDER_SITE_OTHER): Payer: Medicare HMO

## 2014-01-12 DIAGNOSIS — Z23 Encounter for immunization: Secondary | ICD-10-CM

## 2014-02-24 ENCOUNTER — Encounter: Payer: Medicare HMO | Admitting: Family Medicine

## 2014-03-02 ENCOUNTER — Encounter: Payer: Medicare HMO | Admitting: Family Medicine

## 2014-05-17 ENCOUNTER — Ambulatory Visit: Payer: Medicare HMO | Admitting: Interventional Cardiology

## 2014-05-17 NOTE — Progress Notes (Signed)
Has he had his usual labs as previously ordered?

## 2014-05-20 LAB — LIPID PANEL
HDL: 49 mg/dL (ref 35–70)
LDL Cholesterol: 59 mg/dL

## 2014-08-23 ENCOUNTER — Encounter: Payer: Self-pay | Admitting: Interventional Cardiology

## 2014-08-23 ENCOUNTER — Ambulatory Visit (INDEPENDENT_AMBULATORY_CARE_PROVIDER_SITE_OTHER): Payer: PPO | Admitting: Interventional Cardiology

## 2014-08-23 VITALS — BP 128/80 | HR 47 | Ht 65.0 in | Wt 180.0 lb

## 2014-08-23 DIAGNOSIS — E785 Hyperlipidemia, unspecified: Secondary | ICD-10-CM | POA: Diagnosis not present

## 2014-08-23 DIAGNOSIS — I4892 Unspecified atrial flutter: Secondary | ICD-10-CM

## 2014-08-23 DIAGNOSIS — I2510 Atherosclerotic heart disease of native coronary artery without angina pectoris: Secondary | ICD-10-CM | POA: Diagnosis not present

## 2014-08-23 DIAGNOSIS — I10 Essential (primary) hypertension: Secondary | ICD-10-CM

## 2014-08-23 DIAGNOSIS — R001 Bradycardia, unspecified: Secondary | ICD-10-CM

## 2014-08-23 MED ORDER — METOPROLOL TARTRATE 25 MG PO TABS: 12.5000 mg | ORAL_TABLET | Freq: Every day | ORAL | Status: DC

## 2014-08-23 NOTE — Patient Instructions (Signed)
Medication Instructions:  Decrease Metoprolol to 12.5 mg daily-all other remain the same  Labwork: None  Testing/Procedures: None  Follow-Up: Your physician wants you to follow-up in: 1 year. You will receive a reminder letter in the mail two months in advance. If you don't receive a letter, please call our office to schedule the follow-up appointment.

## 2014-08-23 NOTE — Progress Notes (Signed)
Patient ID: [name], male   DOB: [birth year], 69 y.o.   MRN: [id number]     Cardiology Office Note   Date:  08/25/2014   ID:  Donald [name], DOB Aug 04, 1945, MRN [id number]  PCP:  Lucretia Kern., DO    No chief complaint on file. f/u CAD   Wt Readings from Last 3 Encounters:  08/23/14 180 lb (81.647 kg)  09/24/13 181 lb 8 oz (82.328 kg)  05/11/13 182 lb (82.555 kg)       History of Present Illness: Donald [name] is a 69 y.o. male  who has CAD and a h/o AFlutter. He had an ablation for atrial flutter in 2011, and several years before that in [place]. He had high heart rates with minimal exercise but this has resolved. He is off of coumadin. He exercises 5-6x/week, mostly weights. He wants to increase cardio exercises. Cardio limited by right knee pain. CAD/ASCVD:  Denies : Chest pain.  Diaphoresis.  Dizziness.  Dyspnea on exertion.  Fatigue.  Leg edema.  Nitroglycerin.  Orthopnea.  Palpitations.  Syncope.   Overall, he feels well and remains active.     Past Medical History  Diagnosis Date  . Chicken pox   . Seasonal allergies   . Heart disease   . Colon polyp   . Hypertension   . BPH (benign prostatic hyperplasia)   . Coronary artery disease   . Allergic rhinitis   . High cholesterol     goal LDL less than 70  . Obesity   . Chronic wrist pain   . Arrhythmia     hx of arrythmia resovled after ablation  . History of basal cell carcinoma     left upper back, right chest   . History of MI (myocardial infarction)     2004    Past Surgical History  Procedure Laterality Date  . Coronary artery bypass graft    . Rotator cuff repair    . Wrist surgery       Current Outpatient Prescriptions  Medication Sig Dispense Refill  . aspirin 81 MG tablet Take 81 mg by mouth daily.    Marland Kitchen atorvastatin (LIPITOR) 80 MG tablet Take 40 mg by mouth daily.     . finasteride (PROSCAR) 5 MG tablet Take 5 mg by mouth daily.    . Multiple Vitamin (MULTIVITAMIN) tablet Take 1  tablet by mouth daily.    . Omega-3 Fatty Acids (FISH OIL) 1000 MG CAPS Take by mouth.    . Red Yeast Rice Extract (RED YEAST RICE PO) Take by mouth.    . metoprolol tartrate (LOPRESSOR) 25 MG tablet Take 0.5 tablets (12.5 mg total) by mouth daily. 30 tablet 11   No current facility-administered medications for this visit.    Allergies:   Review of patient's allergies indicates no known allergies.    Social History:  The patient  reports that he has never smoked. He does not have any smokeless tobacco history on file. He reports that he drinks alcohol. He reports that he does not use illicit drugs.   Family History:  The patient's family history includes Cancer in his father.    ROS:  Please see the history of present illness.   Otherwise, review of systems are positive for knee pain.   All other systems are reviewed and negative.    PHYSICAL EXAM: VS:  BP 128/80 mmHg  Pulse 47  Ht 5\' 5"  (1.651 m)  Wt 180 lb (81.647 kg)  BMI 29.95 kg/m2 , BMI Body mass index is 29.95 kg/(m^2). GEN: Well nourished, well developed, in no acute distress HEENT: normal Neck: no JVD, carotid bruits, or masses Cardiac: RRR; no murmurs, rubs, or gallops,no edema  Respiratory:  clear to auscultation bilaterally, normal work of breathing GI: soft, nontender, nondistended, + BS MS: no deformity or atrophy Skin: warm and dry, no rash Neuro:  Strength and sensation are intact Psych: euthymic mood, full affect   EKG:   The ekg ordered today demonstrates Sinus bradycardia, RBBB   Recent Labs: No results found for requested labs within last 365 days.   Lipid Panel No results found for: CHOL, TRIG, HDL, CHOLHDL, VLDL, LDLCALC, LDLDIRECT   Other studies Reviewed: Additional studies/ records that were reviewed today with results demonstrating: Cr 0.9 in 3/16   ASSESSMENT AND PLAN:  1. Bradycardia: Decrease metoprolol.  Lipids and liver tests from 3/16 reviewed and well controlled.  DOne at the [place].   LDL 59.  Continue atorva and red yeast rice combo. 2. Hyperlipidemia: As noted above.  On atorvastatin from the [place]. TOlerating this well.  3. CAD: No angina.  4. Hematuria: Intermittent.  Resolved at this time.  Negative prior w/u. 5. HTN: BP controlled.  6. Atral flutter: No sx of recurrence. Off of coumadin post ablation x 2, last in 2011 with Dr. Rayann Heman. He was symptomatic at that time with tachycardia.    Current medicines are reviewed at length with the patient today.  The patient concerns regarding his medicines were addressed.  The following changes have been made:  As above  Labs/ tests ordered today include:   Orders Placed This Encounter  Procedures  . EKG 12-Lead    Recommend 150 minutes/week of aerobic exercise Low fat, low carb, high fiber diet recommended  Disposition:   FU in 1 year   Teresita Madura., MD  08/25/2014 2:05 [facility] [place], [place], [place]  [address] Phone: [phone]; Fax: [phone]

## 2014-09-09 ENCOUNTER — Encounter: Payer: Self-pay | Admitting: Family Medicine

## 2014-09-09 ENCOUNTER — Ambulatory Visit (INDEPENDENT_AMBULATORY_CARE_PROVIDER_SITE_OTHER): Payer: PPO | Admitting: Family Medicine

## 2014-09-09 VITALS — BP 100/68 | HR 57 | Temp 97.7°F | Ht 65.0 in | Wt 180.7 lb

## 2014-09-09 DIAGNOSIS — Z0000 Encounter for general adult medical examination without abnormal findings: Secondary | ICD-10-CM

## 2014-09-09 DIAGNOSIS — I10 Essential (primary) hypertension: Secondary | ICD-10-CM

## 2014-09-09 DIAGNOSIS — E785 Hyperlipidemia, unspecified: Secondary | ICD-10-CM | POA: Diagnosis not present

## 2014-09-09 DIAGNOSIS — I4892 Unspecified atrial flutter: Secondary | ICD-10-CM

## 2014-09-09 DIAGNOSIS — N400 Enlarged prostate without lower urinary tract symptoms: Secondary | ICD-10-CM | POA: Diagnosis not present

## 2014-09-09 DIAGNOSIS — I2510 Atherosclerotic heart disease of native coronary artery without angina pectoris: Secondary | ICD-10-CM

## 2014-09-09 NOTE — Progress Notes (Signed)
Medicare Annual Preventive Care Visit  (initial annual wellness or annual wellness exam)  Concerns and/or follow up today:  Sees primary care at [facility] for yearly physical and labs.  BPH:  -sees urologist, Dr. Venia Minks in [place]  -has elevated PSA in the past - sees urologist yearly  CAD, A. Flutter, HLD, HTN:  -s/p multiple bypass surgeries in the past  -s/p ablation in 2011 -sees cardiologist for this - Dr. Irish Lack; most recent notes reviewed, labs done 05/2014 [facility] doctor once per year for physical, labs and some of his medications -denies: CP, DOE, SOB, swelling - had labs at [facility] 3/16 -getting regular exercise and eating healthy without symptoms  Plantar fasciitis: -advised [place] sock -reports: doing much better  See care teams for other providers - updated with patient  ROS: negative for report of fevers, unintentional weight loss, vision changes, vision loss, hearing loss or change, chest pain, sob, hemoptysis, melena, hematochezia, hematuria, genital discharge or lesions, falls, bleeding or bruising, loc, thoughts of suicide or self harm, memory loss  1.) Patient-completed health risk assessment  - completed and reviewed, see scanned documentation  2.) Review of Medical History: -PMH, PSH, Family History and current specialty and care providers reviewed and updated and listed below  - see scanned in document in chart and below  Past Medical History  Diagnosis Date  . Chicken pox   . Seasonal allergies   . Heart disease   . Colon polyp   . Hypertension   . BPH (benign prostatic hyperplasia)   . Coronary artery disease   . Allergic rhinitis   . High cholesterol     goal LDL less than 70  . Obesity   . Chronic wrist pain   . Arrhythmia     hx of arrythmia resovled after ablation  . History of basal cell carcinoma     left upper back, right chest   . History of MI (myocardial infarction)     2004    Past Surgical History  Procedure Laterality Date   . Coronary artery bypass graft    . Rotator cuff repair    . Wrist surgery      History   Social History  . Marital Status: Married    Spouse Name: N/A  . Number of Children: N/A  . Years of Education: N/A   Occupational History  . Not on file.   Social History Main Topics  . Smoking status: Never Smoker   . Smokeless tobacco: Not on file  . Alcohol Use: Yes     Comment: occ.   . Drug Use: No  . Sexual Activity: Not on file   Other Topics Concern  . 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  NO  History of falling?  NO  Any trouble with IADLs - using a phone, using transportation, grocery shopping, preparing meals, doing housework, doing laundry, taking medications and managing money?  NO  Advance Directives? YES  See summary of recommendations in Patient Instructions below.  4.) Physical Exam Filed Vitals:   09/09/14 0819  BP: 100/68  Pulse: 57  Temp: 97.7 F (36.5 C)   Estimated body mass index is 30.07 kg/(m^2) as calculated from the following:   Height as of this encounter: 5\' 5"  (1.651 m).   Weight as of this encounter: 180 lb 11.2 oz (81.965 kg).  EKG (optional): deferred  General: alert, appear well hydrated and in no acute distress  HEENT: visual acuity grossly intact  CV: HRRR  Lungs: CTA bilaterally  Psych: pleasant and cooperative, no obvious depression or anxiety  Cognitive function grossly intact  See patient instructions for recommendations.  Education and counseling regarding the above review of health provided with a plan for the following: -see scanned patient completed form for  further details -fall prevention strategies discussed  -healthy lifestyle discussed -importance and resources for completing advanced directives discussed -see patient instructions below for any other recommendations provided  4)The following written screening schedule of preventive measures were reviewed with assessment and plan made per below, orders and patient instructions:      AAA screening: N/A     Alcohol screening: N/a     Obesity Screening and counseling: done     STI screening (Hep C if born 1947-65): declined     Tobacco Screening: done, no smoking       Pneumococcal (PPSV23 -one dose after 64, one before if risk factors), influenza yearly and hepatitis B vaccines (if high risk - end stage renal disease, IV drugs, homosexual men, live in home for mentally retarded, hemophilia receiving factors) ASSESSMENT/PLAN: done      Prostate cancer screening ASSESSMENT/PLAN: does this with his urologist      Colorectal cancer screening (FOBT yearly or flex sig q4y or colonoscopy q10y or barium enema q4y) ASSESSMENT/PLAN: per pt done in 2012 andthinks UTD; advised my assistant to contact [facility] to see when you are due for your next colonosocpy      Diabetes outpatient self-management training services ASSESSMENT/PLAN:done 05/2014      Bone mass measurements(covered q2y if indicated - estrogen def, osteoporosis, hyperparathyroid, vertebral abnormalities, osteoporosis or steroids) ASSESSMENT/PLAN: n/a      Screening for glaucoma(q1y if high risk - diabetes, FH, AA and > 50 or hispanic and > 65) ASSESSMENT/PLAN: sees optho      Medical nutritional therapy for individuals with diabetes or renal disease ASSESSMENT/PLAN: n/a      Cardiovascular screening blood tests (lipids q5y) ASSESSMENT/PLAN: done, see scanned labs       Diabetes screening tests ASSESSMENT/PLAN: done, see scanned labs   7.) Summary: -risk factors and conditions per above assessment were discussed and  treatment, recommendations and referrals were offered per documentation above and orders and patient instructions.  Medicare annual wellness visit, subsequent  HYPERTENSION, MILD  Hyperlipidemia  BPH (benign prostatic hyperplasia) - followed by urologist, Dr. Venia Minks in [place]  Atherosclerosis of native coronary artery of native heart without angina pectoris  Atrial flutter, unspecified  Patient Instructions  BEFORE YOU LEAVE: -Wendie Simmer, please obtain last colonoscopy report and recommendations from [facility]  We recommend the following healthy lifestyle measures: - eat a healthy diet consisting of lots of vegetables, fruits, beans, nuts, seeds, healthy meats such as white chicken and fish and whole grains.  - avoid fried foods, fast food, processed foods, sodas, red meet and other fattening foods.  - get a least 150 minutes of aerobic exercise per week.   The following written screening schedule of preventive measures were  reviewed with assessment and plan made per below, orders and patient instructions:      AAA screening: N/A     Alcohol screening: N/a     Obesity Screening and counseling: done     STI screening (Hep C if born [birth year]-65): declined     Tobacco Screening: done, no smoking       Pneumococcal (PPSV23 -one dose after 64, one before if risk factors), influenza yearly and hepatitis B vaccines (if high risk - end stage renal disease, IV drugs, homosexual men, live in home for mentally retarded, hemophilia receiving factors) ASSESSMENT/PLAN: done      Prostate cancer screening ASSESSMENT/PLAN: does this with his urologist      Colorectal cancer screening (FOBT yearly or flex sig q4y or colonoscopy q10y or barium enema q4y) ASSESSMENT/PLAN: per pt done in 2012 andthinks UTD; advised my assistant to contact [facility] to see when you are due for your next colonosocpy      Diabetes outpatient self-management training services ASSESSMENT/PLAN:done 05/2014      Bone mass  measurements(covered q2y if indicated - estrogen def, osteoporosis, hyperparathyroid, vertebral abnormalities, osteoporosis or steroids) ASSESSMENT/PLAN: n/a      Screening for glaucoma(q1y if high risk - diabetes, FH, AA and > 50 or hispanic and > 65) ASSESSMENT/PLAN: sees optho      Medical nutritional therapy for individuals with diabetes or renal disease ASSESSMENT/PLAN: n/a      Cardiovascular screening blood tests (lipids q5y) ASSESSMENT/PLAN: done, see scanned labs       Diabetes screening tests ASSESSMENT/PLAN: done, see scanned labs

## 2014-09-09 NOTE — Patient Instructions (Signed)
BEFORE YOU LEAVE: -Wendie Simmer, please obtain last colonoscopy report and recommendations from [facility]  We recommend the following healthy lifestyle measures: - eat a healthy diet consisting of lots of vegetables, fruits, beans, nuts, seeds, healthy meats such as white chicken and fish and whole grains.  - avoid fried foods, fast food, processed foods, sodas, red meet and other fattening foods.  - get a least 150 minutes of aerobic exercise per week.   The following written screening schedule of preventive measures were reviewed with assessment and plan made per below, orders and patient instructions:      AAA screening: N/A     Alcohol screening: N/a     Obesity Screening and counseling: done     STI screening (Hep C if born [birth year]-65): declined     Tobacco Screening: done, no smoking       Pneumococcal (PPSV23 -one dose after 64, one before if risk factors), influenza yearly and hepatitis B vaccines (if high risk - end stage renal disease, IV drugs, homosexual men, live in home for mentally retarded, hemophilia receiving factors) ASSESSMENT/PLAN: done      Prostate cancer screening ASSESSMENT/PLAN: does this with his urologist      Colorectal cancer screening (FOBT yearly or flex sig q4y or colonoscopy q10y or barium enema q4y) ASSESSMENT/PLAN: per pt done in 2012 andthinks UTD; advised my assistant to contact [facility] to see when you are due for your next colonosocpy      Diabetes outpatient self-management training services ASSESSMENT/PLAN:done 05/2014      Bone mass measurements(covered q2y if indicated - estrogen def, osteoporosis, hyperparathyroid, vertebral abnormalities, osteoporosis or steroids) ASSESSMENT/PLAN: n/a      Screening for glaucoma(q1y if high risk - diabetes, FH, AA and > 50 or hispanic and > 65) ASSESSMENT/PLAN: sees optho      Medical nutritional therapy for individuals with diabetes or renal disease ASSESSMENT/PLAN: n/a      Cardiovascular screening blood  tests (lipids q5y) ASSESSMENT/PLAN: done, see scanned labs       Diabetes screening tests ASSESSMENT/PLAN: done, see scanned labs

## 2014-09-09 NOTE — Progress Notes (Signed)
Pt in process of setting this up now

## 2014-09-09 NOTE — Progress Notes (Signed)
Pre visit review using our clinic review tool, if applicable. No additional management support is needed unless otherwise documented below in the visit note. 

## 2015-07-07 DIAGNOSIS — N400 Enlarged prostate without lower urinary tract symptoms: Secondary | ICD-10-CM | POA: Diagnosis not present

## 2015-07-14 DIAGNOSIS — N138 Other obstructive and reflux uropathy: Secondary | ICD-10-CM | POA: Diagnosis not present

## 2015-07-14 DIAGNOSIS — K4090 Unilateral inguinal hernia, without obstruction or gangrene, not specified as recurrent: Secondary | ICD-10-CM | POA: Diagnosis not present

## 2015-07-14 DIAGNOSIS — N401 Enlarged prostate with lower urinary tract symptoms: Secondary | ICD-10-CM | POA: Diagnosis not present

## 2015-08-08 DIAGNOSIS — K4090 Unilateral inguinal hernia, without obstruction or gangrene, not specified as recurrent: Secondary | ICD-10-CM | POA: Diagnosis not present

## 2015-08-11 ENCOUNTER — Encounter (HOSPITAL_COMMUNITY): Payer: Self-pay

## 2015-08-11 ENCOUNTER — Emergency Department (HOSPITAL_COMMUNITY)
Admission: EM | Admit: 2015-08-11 | Discharge: 2015-08-12 | Disposition: A | Discharge status: Home / Self Care | Payer: PPO | Attending: Emergency Medicine | Admitting: Emergency Medicine

## 2015-08-11 DIAGNOSIS — Z79899 Other long term (current) drug therapy: Secondary | ICD-10-CM | POA: Diagnosis not present

## 2015-08-11 DIAGNOSIS — G8918 Other acute postprocedural pain: Secondary | ICD-10-CM

## 2015-08-11 DIAGNOSIS — E7800 Pure hypercholesterolemia, unspecified: Secondary | ICD-10-CM | POA: Insufficient documentation

## 2015-08-11 DIAGNOSIS — Z85828 Personal history of other malignant neoplasm of skin: Secondary | ICD-10-CM | POA: Diagnosis not present

## 2015-08-11 DIAGNOSIS — E669 Obesity, unspecified: Secondary | ICD-10-CM | POA: Insufficient documentation

## 2015-08-11 DIAGNOSIS — Z86010 Personal history of colonic polyps: Secondary | ICD-10-CM | POA: Diagnosis not present

## 2015-08-11 DIAGNOSIS — Z87438 Personal history of other diseases of male genital organs: Secondary | ICD-10-CM | POA: Insufficient documentation

## 2015-08-11 DIAGNOSIS — I252 Old myocardial infarction: Secondary | ICD-10-CM | POA: Diagnosis not present

## 2015-08-11 DIAGNOSIS — M79651 Pain in right thigh: Secondary | ICD-10-CM

## 2015-08-11 DIAGNOSIS — Z8619 Personal history of other infectious and parasitic diseases: Secondary | ICD-10-CM | POA: Diagnosis not present

## 2015-08-11 DIAGNOSIS — Z951 Presence of aortocoronary bypass graft: Secondary | ICD-10-CM | POA: Insufficient documentation

## 2015-08-11 DIAGNOSIS — I2510 Atherosclerotic heart disease of native coronary artery without angina pectoris: Secondary | ICD-10-CM | POA: Diagnosis not present

## 2015-08-11 DIAGNOSIS — Z7982 Long term (current) use of aspirin: Secondary | ICD-10-CM | POA: Insufficient documentation

## 2015-08-11 DIAGNOSIS — I10 Essential (primary) hypertension: Secondary | ICD-10-CM | POA: Diagnosis not present

## 2015-08-11 DIAGNOSIS — K4090 Unilateral inguinal hernia, without obstruction or gangrene, not specified as recurrent: Secondary | ICD-10-CM | POA: Diagnosis not present

## 2015-08-11 LAB — COMPREHENSIVE METABOLIC PANEL
ALT: 15 U/L — ABNORMAL LOW (ref 17–63)
AST: 21 U/L (ref 15–41)
Albumin: 3.5 g/dL (ref 3.5–5.0)
Alkaline Phosphatase: 71 U/L (ref 38–126)
Anion gap: 7 (ref 5–15)
BUN: 13 mg/dL (ref 6–20)
CO2: 28 mmol/L (ref 22–32)
Calcium: 9 mg/dL (ref 8.9–10.3)
Chloride: 103 mmol/L (ref 101–111)
Creatinine, Ser: 0.98 mg/dL (ref 0.61–1.24)
GFR calc Af Amer: >60 mL/min (ref >60)
GFR calc non Af Amer: >60 mL/min (ref >60)
Glucose, Bld: 148 mg/dL — ABNORMAL HIGH (ref 65–99)
Potassium: 3.8 mmol/L (ref 3.5–5.1)
Sodium: 138 mmol/L (ref 135–145)
Total Bilirubin: 0.7 mg/dL (ref 0.3–1.2)
Total Protein: 6.8 g/dL (ref 6.5–8.1)

## 2015-08-11 LAB — CBC WITH DIFFERENTIAL/PLATELET
Basophils Absolute: 0 10*3/uL (ref 0.0–0.1)
Basophils Relative: 0 %
Eosinophils Absolute: 0.1 10*3/uL (ref 0.0–0.7)
Eosinophils Relative: 2 %
HCT: 37.7 % — ABNORMAL LOW (ref 39.0–52.0)
Hemoglobin: 12.4 g/dL — ABNORMAL LOW (ref 13.0–17.0)
Lymphocytes Relative: 17 %
Lymphs Abs: 1.3 10*3/uL (ref 0.7–4.0)
MCH: 28.9 pg (ref 26.0–34.0)
MCHC: 32.9 g/dL (ref 30.0–36.0)
MCV: 87.9 fL (ref 78.0–100.0)
Monocytes Absolute: 0.7 10*3/uL (ref 0.1–1.0)
Monocytes Relative: 9 %
Neutro Abs: 5.5 10*3/uL (ref 1.7–7.7)
Neutrophils Relative %: 72 %
Platelets: 197 10*3/uL (ref 150–400)
RBC: 4.29 MIL/uL (ref 4.22–5.81)
RDW: 13.4 % (ref 11.5–15.5)
WBC: 7.6 10*3/uL (ref 4.0–10.5)

## 2015-08-11 MED ORDER — MORPHINE SULFATE (PF) 4 MG/ML IV SOLN
4.0000 mg | Freq: Once | INTRAVENOUS | Status: AC
  Administered 2015-08-12: 4 mg via INTRAVENOUS
  Filled 2015-08-11: qty 1

## 2015-08-11 NOTE — ED Provider Notes (Signed)
Patient is status post elective right-sided inguinal herniorrhaphy 3 days ago. Complains of pain at right proximal thigh and right groin. Pain radiates to right buttock since yesterday pain worse with movement. Denies fever. Pain is worse with walking or moving his right thigh. On exam patient is in no distress abdomen normal active bowel sounds nontender. Right inguinal area with stapled surgical wound which appears clean with corresponding tenderness and ecchymosis. Femoral pulses 2+. Proximal anterior thigh minimally tender. Not red warm or swollen. EP and femoral pulses 2+ laterally  Orlie Dakin, MD 08/11/15 2352

## 2015-08-11 NOTE — ED Provider Notes (Signed)
CSN: [id number]     Arrival date & time 08/11/15  1711 History   First MD Initiated Contact with Patient 08/11/15 2212     Chief Complaint  Patient presents with  . Post-op Problem     (Consider location/radiation/quality/duration/timing/severity/associated sxs/prior Treatment) HPI Donald [name] is a 70 y.o. male here for evaluation of a postop problem. Patient reports he had right inguinal hernia surgery on Monday completed at [facility]. He reports his wound has been healing well. He reports going to the mailbox today and experiencing worsening right thigh and buttock pain. Denies any redness or swelling, cough, chest pain or shortness of breath. No cool extremities. He does take a 81 mg aspirin daily.  Past Medical History  Diagnosis Date  . Chicken pox   . Seasonal allergies   . Heart disease   . Colon polyp   . Hypertension   . BPH (benign prostatic hyperplasia)   . Coronary artery disease   . Allergic rhinitis   . High cholesterol     goal LDL less than 70  . Obesity   . Chronic wrist pain   . Arrhythmia     hx of arrythmia resovled after ablation  . History of basal cell carcinoma     left upper back, right chest   . History of MI (myocardial infarction)     2004   Past Surgical History  Procedure Laterality Date  . Coronary artery bypass graft    . Rotator cuff repair    . Wrist surgery     Family History  Problem Relation Age of Onset  . Cancer Father     throat cancer, chewing tobacco   Social History  Substance Use Topics  . Smoking status: Never Smoker   . Smokeless tobacco: None  . Alcohol Use: Yes     Comment: occ.     Review of Systems A 10 point review of systems was completed and was negative except for pertinent positives and negatives as mentioned in the history of present illness     Allergies  Review of patient's allergies indicates no known allergies.  Home Medications   Prior to Admission medications   Medication Sig Start Date  End Date Taking? Authorizing Provider  aspirin 81 MG tablet Take 81 mg by mouth daily.    Historical Provider, MD  atorvastatin (LIPITOR) 80 MG tablet Take 40 mg by mouth daily.     Historical Provider, MD  finasteride (PROSCAR) 5 MG tablet Take 5 mg by mouth daily.    Historical Provider, MD  metoprolol tartrate (LOPRESSOR) 25 MG tablet Take 0.5 tablets (12.5 mg total) by mouth daily. 08/23/14   Jettie Booze, MD  Multiple Vitamin (MULTIVITAMIN) tablet Take 1 tablet by mouth daily.    Historical Provider, MD  Omega-3 Fatty Acids (FISH OIL) 1000 MG CAPS Take by mouth.    Historical Provider, MD  Red Yeast Rice Extract (RED YEAST RICE PO) Take by mouth.    Historical Provider, MD   BP 137/65 mmHg  Pulse 65  Temp(Src) 99.3 F (37.4 C) (Oral)  Resp 16  SpO2 98% Physical Exam  Constitutional: He is oriented to person, place, and time. He appears well-developed and well-nourished.  HENT:  Head: Normocephalic and atraumatic.  Mouth/Throat: Oropharynx is clear and moist.  Eyes: Conjunctivae are normal. Pupils are equal, round, and reactive to light. Right eye exhibits no discharge. Left eye exhibits no discharge. No scleral icterus.  Neck: Neck supple.  Cardiovascular: Normal rate, regular rhythm and normal heart sounds.   Pulmonary/Chest: Effort normal and breath sounds normal. No respiratory distress. He has no wheezes. He has no rales.  Abdominal: Soft. He exhibits no distension and no mass. There is no tenderness. There is no rebound and no guarding.  Musculoskeletal: He exhibits no tenderness.  Surgical site of right inguinal area appears to be healing well, no surrounding cellulitis, drainage or other abnormal findings. Appropriate amount of postsurgical tenderness. No focal tenderness on the proximal right thigh or buttock. No unilateral leg swelling appreciated. No erythema or edema. Distal pulses intact with brisk cap Refill. Maintains baseline range of motion in right lower  extremity  Neurological: He is alert and oriented to person, place, and time.  Cranial Nerves II-XII grossly intact  Skin: Skin is warm and dry. No rash noted.  Psychiatric: He has a normal mood and affect.  Nursing note and vitals reviewed.   ED Course  Procedures (including critical care time) Labs Review Labs Reviewed  CBC WITH DIFFERENTIAL/PLATELET - Abnormal; Notable for the following:    Hemoglobin 12.4 (*)    HCT 37.7 (*)    All other components within normal limits  COMPREHENSIVE METABOLIC PANEL - Abnormal; Notable for the following:    Glucose, Bld 148 (*)    ALT 15 (*)    All other components within normal limits    Imaging Review No results found. I have personally reviewed and evaluated these images and lab results as part of my medical decision-making.   EKG Interpretation None      MDM  Patient presents to ED for right thigh and buttock pain after having an elective right inguinal hernia repair completed on Monday at [facility]. He is having increased pain with ambulation. On arrival, he is hemodynamically stable, afebrile. Denies any cardiopulmonary complaints, fevers or chills. Physical exam shows no evidence of infection. Low suspicion for DVT, septic joint. We'll obtain CT pelvis to rule out postsurgical abscess. Pain medicine given in emergency department.  Patient is able to ambulate in emergency department. Patient care signed out to oncoming provider, Dr. Maryan Rued, for follow-up on CT scan.  If there are no new objective findings, patient may be discharged home to follow-up with his surgeon for further evaluation and management of symptoms. I discussed and reviewed this case with my attending, Dr. Winfred Leeds. Final diagnoses:  Acute thigh pain, right        Comer Locket, PA-C 08/12/15 [id number]  Orlie Dakin, MD 08/12/15 [id number]

## 2015-08-11 NOTE — ED Notes (Addendum)
Pt states he had an inguinal hernia repair at [facility] on Monday and since then he has had gradual pain to right groin that radiates to his right hip and down his right leg. He states he cant walk due to the pain.

## 2015-08-12 ENCOUNTER — Emergency Department (HOSPITAL_COMMUNITY): Payer: PPO

## 2015-08-12 DIAGNOSIS — K4090 Unilateral inguinal hernia, without obstruction or gangrene, not specified as recurrent: Secondary | ICD-10-CM | POA: Diagnosis not present

## 2015-08-12 MED ORDER — IOPAMIDOL (ISOVUE-300) INJECTION 61%
INTRAVENOUS | Status: AC
  Administered 2015-08-12: 100 mL
  Filled 2015-08-12: qty 100

## 2015-08-12 MED ORDER — MORPHINE SULFATE (PF) 4 MG/ML IV SOLN
4.0000 mg | Freq: Once | INTRAVENOUS | Status: AC
  Administered 2015-08-12: 4 mg via INTRAVENOUS
  Filled 2015-08-12: qty 1

## 2015-08-12 NOTE — ED Provider Notes (Signed)
Final result by Rad Results In Interface (08/12/15 01:57:39)   Narrative:   CLINICAL DATA: 70 year old male status post right inguinal hernia repair 3 days ago. Patient presenting pain in the right proximal thigh and right groin radiating to the buttock.  EXAM: CT PELVIS WITH AND WITHOUT CONTRAST  TECHNIQUE: Multidetector CT imaging of the pelvis was performed following the standard protocol before and following the bolus administration of intravenous contrast.  CONTRAST: 14mL ISOVUE-300 IOPAMIDOL (ISOVUE-300) INJECTION 61%  COMPARISON: None  FINDINGS: There is postsurgical changes of right inguinal hernia repair. A small rounded density in the right anterior pelvic floor superior to the inguinal ligament likely represent a hernia repair plug. There are multiple small of air in the right inguinal region extending from the level of the inguinal ligament into the right inguinal canal likely postsurgical. Small pockets of subcutaneous air also noted in the right groin. There is stranding of the fat in the right inguinal canal which is also likely postsurgical. There is no drainable fluid collection. No hematoma. No evidence of vascular injury or pseudo aneurysm. Skin surgical staples noted in the right inguinal region.  There is a partially visualized 5 cm right renal cystic lesion. The visualized ureters and urinary bladder appear unremarkable. The prostate and seminal vesicles are grossly unremarkable.  There is sigmoid diverticulosis without active inflammatory changes. No dilated bowel loops identified. Normal appendix.  There is mild aortoiliac atherosclerotic disease. No adenopathy identified within the pelvis.  There bilateral L5 pars defects with grade 1 L5-S1 anterolisthesis. No acute fracture.  IMPRESSION: Postsurgical changes of right inguinal hernia repair with stranding and small pockets of gas, likely postoperative. No fluid collection or  hematoma.   Electronically Signed By: Anner Crete M.D.   CT is negative for acute complications from recent surgery. Patient is well-appearing and feel that it's reasonable for discharge. He has pain medication at home and will follow-up with his surgeon.  Blanchie Dessert, MD 08/12/15 [phone]

## 2015-08-12 NOTE — Discharge Instructions (Signed)
You were evaluated in the ED today for your right sided fine pain. There does not appear to be an emergent cause for your symptoms at this time. However, it is important for you to follow-up with your surgeon. Please call him tomorrow for reevaluation. Return to ED for any new or worsening symptoms.

## 2015-08-12 NOTE — ED Notes (Signed)
Patient transported to CT 

## 2015-08-16 DIAGNOSIS — K4030 Unilateral inguinal hernia, with obstruction, without gangrene, not specified as recurrent: Secondary | ICD-10-CM | POA: Diagnosis not present

## 2015-08-17 DIAGNOSIS — M5145 Schmorl's nodes, thoracolumbar region: Secondary | ICD-10-CM | POA: Diagnosis not present

## 2015-08-17 DIAGNOSIS — M5125 Other intervertebral disc displacement, thoracolumbar region: Secondary | ICD-10-CM | POA: Diagnosis not present

## 2015-08-17 DIAGNOSIS — M5136 Other intervertebral disc degeneration, lumbar region: Secondary | ICD-10-CM | POA: Diagnosis not present

## 2015-08-17 DIAGNOSIS — M47817 Spondylosis without myelopathy or radiculopathy, lumbosacral region: Secondary | ICD-10-CM | POA: Diagnosis not present

## 2015-08-17 DIAGNOSIS — N281 Cyst of kidney, acquired: Secondary | ICD-10-CM | POA: Diagnosis not present

## 2015-08-17 DIAGNOSIS — M5126 Other intervertebral disc displacement, lumbar region: Secondary | ICD-10-CM | POA: Diagnosis not present

## 2015-08-17 DIAGNOSIS — M545 Low back pain: Secondary | ICD-10-CM | POA: Diagnosis not present

## 2015-08-19 DIAGNOSIS — N138 Other obstructive and reflux uropathy: Secondary | ICD-10-CM | POA: Diagnosis not present

## 2015-08-19 DIAGNOSIS — N401 Enlarged prostate with lower urinary tract symptoms: Secondary | ICD-10-CM | POA: Diagnosis not present

## 2015-08-30 ENCOUNTER — Telehealth: Payer: Self-pay | Admitting: Interventional Cardiology

## 2015-08-30 NOTE — Telephone Encounter (Signed)
Patient had CMET and CBC a couple weeks ago. Instructed patient to come to OV 6/29 fasting in case Dr. Irish Lack would like repeat lab work (has not been drawn in over a year). Patient was grateful for call.

## 2015-08-30 NOTE — Telephone Encounter (Signed)
New Message  Pt stated he used to have blood work done !@ [facility] and bring print out to Dr Clayton Bibles- did not [facility]- pt wanted to know if he should have them done day of Appt- 6/29- no order in syst. Please call back and discuss.

## 2015-08-31 NOTE — Progress Notes (Signed)
Patient ID: NESSIAH PHA, male   DOB: July 03, 1945, 70 y.o.   MRN: [id number]     Cardiology Office Note   Date:  09/01/2015   ID:  Donald [name], DOB 02-22-1946, MRN [id number]  PCP:  Lucretia Kern., DO    No chief complaint on file. f/u CAD   Wt Readings from Last 3 Encounters:  09/01/15 174 lb (78.926 kg)  09/09/14 180 lb 11.2 oz (81.965 kg)  08/23/14 180 lb (81.647 kg)       History of Present Illness: Donald [name] is a 70 y.o. male  who has CAD, CABG in 2001and a h/o AFlutter. He had an ablation for atrial flutter in 2011, and several years before that in [place]. He had high heart rates with minimal exercise but this has resolved. He is off of coumadin. He exercises 5-6x/week, mostly weights. He wants to increase cardio exercises. Cardio limited by right knee pain- no surgery planned. CAD/ASCVD:  Denies : Chest pain.  Diaphoresis.  Dizziness.  Dyspnea on exertion.  Fatigue.  Leg edema.  Nitroglycerin.  Orthopnea.  Palpitations.  Syncope.   Overall, he feels well and remains active.   In the past month, he had a problem with a hernia and back trouble.  Hernia surgery was done at [facility].  He reports a suboptimal experience there.   THis is getting better.  Walking is his only exercise that is allowed at this time.  He is hoping to increase.    He is looking to see a urologist in [place] for elevated PSA.    Past Medical History  Diagnosis Date  . Chicken pox   . Seasonal allergies   . Heart disease   . Colon polyp   . Hypertension   . BPH (benign prostatic hyperplasia)   . Coronary artery disease   . Allergic rhinitis   . High cholesterol     goal LDL less than 70  . Obesity   . Chronic wrist pain   . Arrhythmia     hx of arrythmia resovled after ablation  . History of basal cell carcinoma     left upper back, right chest   . History of MI (myocardial infarction)     2004    Past Surgical History  Procedure Laterality Date  . Coronary artery bypass graft     . Rotator cuff repair    . Wrist surgery       Current Outpatient Prescriptions  Medication Sig Dispense Refill  . aspirin 81 MG tablet Take 81 mg by mouth daily.    Marland Kitchen atorvastatin (LIPITOR) 80 MG tablet Take 40 mg by mouth daily.     . finasteride (PROSCAR) 5 MG tablet Take 5 mg by mouth daily.    . metoprolol tartrate (LOPRESSOR) 25 MG tablet Take 0.5 tablets (12.5 mg total) by mouth daily. 30 tablet 11  . Multiple Vitamin (MULTIVITAMIN) tablet Take 1 tablet by mouth daily.    . Omega-3 Fatty Acids (FISH OIL) 1000 MG CAPS Take 1 capsule by mouth daily.     . Red Yeast Rice Extract (RED YEAST RICE PO) Take 1 capsule by mouth daily.      No current facility-administered medications for this visit.    Allergies:   Review of patient's allergies indicates no known allergies.    Social History:  The patient  reports that he has never smoked. He does not have any smokeless tobacco history on file. He reports that he  drinks alcohol. He reports that he does not use illicit drugs.   Family History:  The patient's family history includes Cancer in his father; Heart attack in his mother. There is no history of Hypertension.    ROS:  Please see the history of present illness.   Otherwise, review of systems are positive for knee pain.   All other systems are reviewed and negative.    PHYSICAL EXAM: VS:  BP 120/70 mmHg  Pulse 49  Ht 5\' 5"  (1.651 m)  Wt 174 lb (78.926 kg)  BMI 28.96 kg/m2 , BMI Body mass index is 28.96 kg/(m^2). GEN: Well nourished, well developed, in no acute distress HEENT: normal Neck: no JVD, carotid bruits, or masses Cardiac: RRR; no murmurs, rubs, or gallops,no edema  Respiratory:  clear to auscultation bilaterally, normal work of breathing GI: soft, nontender, nondistended, + BS MS: no deformity or atrophy Skin: warm and dry, no rash Neuro:  Strength and sensation are intact Psych: euthymic mood, full affect   EKG:   The ekg ordered today demonstrates  Sinus bradycardia, RBBB   Recent Labs: 08/11/2015: ALT 15*; BUN 13; Creatinine, Ser 0.98; Hemoglobin 12.4*; Platelets 197; Potassium 3.8; Sodium 138   Lipid Panel    Component Value Date/Time   HDL 49 05/20/2014   LDLCALC 59 05/20/2014     Other studies Reviewed: Additional studies/ records that were reviewed today with results demonstrating: Cr 0.9 in 3/16   ASSESSMENT AND PLAN:  1. Bradycardia: Decreased metoprolol.  COntinue current low dose.  No sx of bradycardia at this time.  He will let us know if he has lightheadedness.  If he develops symptoms, would stop metoprolol altogether. Lipids and liver tests from 3/16 reviewed and well controlled.  Done at the [place].  LDL 59.  Continue atorva and red yeast rice combo.  2. Hyperlipidemia: As noted above.  On atorvastatin from the [place]. Tolerating this well. He will have labs checked soon.  3. CAD: No angina. No NTG use. 4. Hematuria: Intermittent.  Resolved at this time.  Negative prior w/u.  Refer to urologist in [place] per his request for elevated PSA.  Was seen in [place] previously but wants to change. 5. HTN: BP controlled.  6. Atral flutter: No sx of recurrence. Off of coumadin post ablation x 2, last in 2011 with Dr. Rayann Heman. He was symptomatic at that time with tachycardia.    Current medicines are reviewed at length with the patient today.  The patient concerns regarding his medicines were addressed.  The following changes have been made:  As above  Labs/ tests ordered today include:   Orders Placed This Encounter  Procedures  . EKG 12-Lead    Recommend 150 minutes/week of aerobic exercise Low fat, low carb, high fiber diet recommended  Disposition:   FU in 1 year   Signed, Larae Grooms, MD  09/01/2015 10:02 AM    [facility] [place], [place], [place]  [address] Phone: [phone]; Fax: [phone]

## 2015-09-01 ENCOUNTER — Ambulatory Visit (INDEPENDENT_AMBULATORY_CARE_PROVIDER_SITE_OTHER): Payer: PPO | Admitting: Interventional Cardiology

## 2015-09-01 ENCOUNTER — Encounter: Payer: Self-pay | Admitting: Interventional Cardiology

## 2015-09-01 VITALS — BP 120/70 | HR 49 | Ht 65.0 in | Wt 174.0 lb

## 2015-09-01 DIAGNOSIS — I4892 Unspecified atrial flutter: Secondary | ICD-10-CM | POA: Diagnosis not present

## 2015-09-01 DIAGNOSIS — R972 Elevated prostate specific antigen [PSA]: Secondary | ICD-10-CM

## 2015-09-01 DIAGNOSIS — E785 Hyperlipidemia, unspecified: Secondary | ICD-10-CM

## 2015-09-01 DIAGNOSIS — I2510 Atherosclerotic heart disease of native coronary artery without angina pectoris: Secondary | ICD-10-CM | POA: Diagnosis not present

## 2015-09-01 DIAGNOSIS — I10 Essential (primary) hypertension: Secondary | ICD-10-CM | POA: Diagnosis not present

## 2015-09-01 MED ORDER — METOPROLOL TARTRATE 25 MG PO TABS: 12.5000 mg | ORAL_TABLET | Freq: Every day | ORAL | Status: DC

## 2015-09-01 NOTE — Patient Instructions (Signed)
Medication Instructions:  Same-no changes  Labwork: None  Testing/Procedures: None  Follow-Up: Your physician wants you to follow-up in: 1 year. You will receive a reminder letter in the mail two months in advance. If you don't receive a letter, please call our office to schedule the follow-up appointment.   Any Other Special Instructions Will Be Listed Below (If Applicable). Dr Irish Lack is referring you to [facility] for elevated PSA.     If you need a refill on your cardiac medications before your next appointment, please call your pharmacy.

## 2015-12-26 DIAGNOSIS — H2513 Age-related nuclear cataract, bilateral: Secondary | ICD-10-CM | POA: Diagnosis not present

## 2015-12-26 DIAGNOSIS — H524 Presbyopia: Secondary | ICD-10-CM | POA: Diagnosis not present

## 2015-12-26 DIAGNOSIS — H5213 Myopia, bilateral: Secondary | ICD-10-CM | POA: Diagnosis not present

## 2015-12-26 DIAGNOSIS — H52223 Regular astigmatism, bilateral: Secondary | ICD-10-CM | POA: Diagnosis not present

## 2016-02-12 DIAGNOSIS — K4090 Unilateral inguinal hernia, without obstruction or gangrene, not specified as recurrent: Secondary | ICD-10-CM | POA: Diagnosis not present

## 2016-02-12 DIAGNOSIS — R3911 Hesitancy of micturition: Secondary | ICD-10-CM | POA: Diagnosis not present

## 2016-05-22 DIAGNOSIS — M1712 Unilateral primary osteoarthritis, left knee: Secondary | ICD-10-CM | POA: Diagnosis not present

## 2016-05-22 DIAGNOSIS — M25562 Pain in left knee: Secondary | ICD-10-CM | POA: Diagnosis not present

## 2016-05-22 DIAGNOSIS — G8929 Other chronic pain: Secondary | ICD-10-CM | POA: Diagnosis not present

## 2016-06-26 ENCOUNTER — Ambulatory Visit (INDEPENDENT_AMBULATORY_CARE_PROVIDER_SITE_OTHER): Payer: PPO | Admitting: Family Medicine

## 2016-06-26 ENCOUNTER — Telehealth: Payer: Self-pay | Admitting: Family Medicine

## 2016-06-26 ENCOUNTER — Encounter: Payer: Self-pay | Admitting: Family Medicine

## 2016-06-26 VITALS — BP 122/60 | HR 56 | Temp 97.4°F | Ht 65.0 in | Wt 174.0 lb

## 2016-06-26 DIAGNOSIS — R300 Dysuria: Secondary | ICD-10-CM | POA: Diagnosis not present

## 2016-06-26 DIAGNOSIS — R109 Unspecified abdominal pain: Secondary | ICD-10-CM | POA: Diagnosis not present

## 2016-06-26 DIAGNOSIS — R319 Hematuria, unspecified: Secondary | ICD-10-CM

## 2016-06-26 DIAGNOSIS — R110 Nausea: Secondary | ICD-10-CM

## 2016-06-26 LAB — BASIC METABOLIC PANEL
BUN: 20 mg/dL (ref 6–23)
CO2: 27 mEq/L (ref 19–32)
Calcium: 9.4 mg/dL (ref 8.4–10.5)
Chloride: 104 mEq/L (ref 96–112)
Creatinine, Ser: 1.08 mg/dL (ref 0.40–1.50)
GFR: 71.24 mL/min (ref >60.00)
Glucose, Bld: 130 mg/dL — ABNORMAL HIGH (ref 70–99)
Potassium: 3.9 mEq/L (ref 3.5–5.1)
Sodium: 139 mEq/L (ref 135–145)

## 2016-06-26 LAB — POCT URINALYSIS DIPSTICK
Bilirubin, UA: NEGATIVE
Glucose, UA: NEGATIVE
Nitrite, UA: NEGATIVE
Protein, UA: 30
Spec Grav, UA: >=1.03 — AB (ref 1.010–1.025)
Urobilinogen, UA: 0.2 E.U./dL
pH, UA: 5 (ref 5.0–8.0)

## 2016-06-26 LAB — CBC WITH DIFFERENTIAL/PLATELET
Basophils Absolute: 0 10*3/uL (ref 0.0–0.1)
Basophils Relative: 0.4 % (ref 0.0–3.0)
Eosinophils Absolute: 0 10*3/uL (ref 0.0–0.7)
Eosinophils Relative: 0.1 % (ref 0.0–5.0)
HCT: 39.2 % (ref 39.0–52.0)
Hemoglobin: 12.9 g/dL — ABNORMAL LOW (ref 13.0–17.0)
Lymphocytes Relative: 6.4 % — ABNORMAL LOW (ref 12.0–46.0)
Lymphs Abs: 0.8 10*3/uL (ref 0.7–4.0)
MCHC: 32.8 g/dL (ref 30.0–36.0)
MCV: 89.3 fl (ref 78.0–100.0)
Monocytes Absolute: 0.7 10*3/uL (ref 0.1–1.0)
Monocytes Relative: 6 % (ref 3.0–12.0)
Neutro Abs: 10.7 10*3/uL — ABNORMAL HIGH (ref 1.4–7.7)
Neutrophils Relative %: 87.1 % — ABNORMAL HIGH (ref 43.0–77.0)
Platelets: 185 10*3/uL (ref 150.0–400.0)
RBC: 4.4 Mil/uL (ref 4.22–5.81)
RDW: 13.8 % (ref 11.5–15.5)
WBC: 12.2 10*3/uL — ABNORMAL HIGH (ref 4.0–10.5)

## 2016-06-26 MED ORDER — CEFTRIAXONE SODIUM 1 G IJ SOLR
1.0000 g | Freq: Once | INTRAMUSCULAR | Status: AC
  Administered 2016-06-26: 1 g via INTRAMUSCULAR

## 2016-06-26 MED ORDER — CIPROFLOXACIN HCL 500 MG PO TABS: 500.0000 mg | ORAL_TABLET | Freq: Two times a day (BID) | ORAL | 0 refills | Status: DC

## 2016-06-26 MED ORDER — CIPROFLOXACIN HCL 250 MG PO TABS: 250.0000 mg | ORAL_TABLET | Freq: Two times a day (BID) | ORAL | 0 refills | Status: DC

## 2016-06-26 NOTE — Telephone Encounter (Signed)
Could try over the counter tylenol. If severe or worsening pain should go to [facility] or ER as would need CT today.

## 2016-06-26 NOTE — Addendum Note (Signed)
Addended by: Tomi Likens on: 06/26/2016 01:44 PM   Modules accepted: Orders

## 2016-06-26 NOTE — Telephone Encounter (Signed)
Patient came in to the office and was given a Rocephin injection per Dr Maudie Mercury.

## 2016-06-26 NOTE — Telephone Encounter (Signed)
I left a detailed message with the information below at the pts home number. 

## 2016-06-26 NOTE — Addendum Note (Signed)
Addended by: Lucretia Kern on: 06/26/2016 04:22 PM   Modules accepted: Orders

## 2016-06-26 NOTE — Telephone Encounter (Signed)
Pt would like to know if there is something that he can take for the pain that he is having in his back due to what he thinks is kidney stones. (something OTC)

## 2016-06-26 NOTE — Telephone Encounter (Signed)
Donald [name] pt returned your call °

## 2016-06-26 NOTE — Progress Notes (Addendum)
HPI:  Acute visit for dysuria: -x5 days -symptoms: burning with urination, frequencyhematuria for 2 days 2 days ago, nausea today, some R flank and R low back pain intermittently, did start after yard work a few days ago -denies: hesitancy, abd pain, fevers, malaise, vomiting, diarrhea -sees urologist (Dr. Tresa Moore) for BPH, hx obstruction -denies hx kidney stones  ROS: See pertinent positives and negatives per HPI.  Past Medical History:  Diagnosis Date  . Allergic rhinitis   . Arrhythmia    hx of arrythmia resovled after ablation  . BPH (benign prostatic hyperplasia)   . Chicken pox   . Chronic wrist pain   . Colon polyp   . Coronary artery disease   . Heart disease   . High cholesterol    goal LDL less than 70  . History of basal cell carcinoma    left upper back, right chest   . History of MI (myocardial infarction)    2004  . Hypertension   . Obesity   . Seasonal allergies     Past Surgical History:  Procedure Laterality Date  . CORONARY ARTERY BYPASS GRAFT    . ROTATOR CUFF REPAIR    . WRIST SURGERY      Family History  Problem Relation Age of Onset  . Cancer Father     throat cancer, chewing tobacco  . Heart attack Mother   . Hypertension Neg Hx     Social History   Social History  . Marital status: Married    Spouse name: N/A  . Number of children: N/A  . Years of education: N/A   Social History Main Topics  . Smoking status: Never Smoker  . Smokeless tobacco: Current User  . Alcohol use Yes     Comment: occ.   . Drug use: No  . Sexual activity: Not Asked   Other Topics Concern  . 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  aspirin 81 MG tablet, Take 81 mg by mouth daily., Disp: , Rfl:   .  atorvastatin (LIPITOR) 80 MG tablet, Take 40 mg by mouth daily. , Disp: , Rfl:  .  metoprolol tartrate (LOPRESSOR) 25 MG tablet, Take 0.5 tablets (12.5 mg total) by mouth daily., Disp: 90 tablet, Rfl: 3 .  Multiple Vitamin (MULTIVITAMIN) tablet, Take 1 tablet by mouth daily., Disp: , Rfl:  .  Omega-3 Fatty Acids (FISH OIL) 1000 MG CAPS, Take 1 capsule by mouth daily. , Disp: , Rfl:  .  Red Yeast Rice Extract (RED YEAST RICE PO), Take 1 capsule by mouth daily. , Disp: , Rfl:  .  ciprofloxacin (CIPRO) 250 MG tablet, Take 1 tablet (250 mg total) by mouth 2 (two) times daily., Disp: 10 tablet, Rfl: 0  EXAM:  Vitals:   06/26/16 1106  BP: 122/60  Pulse: (!) 56  Temp: 97.4 F (36.3 C)    Body mass index is 28.96 kg/m.  GENERAL: vitals reviewed and listed above, alert, oriented, appears well hydrated and in no acute distress  HEENT: atraumatic, conjunttiva clear, no obvious abnormalities on inspection of external nose and ears  NECK:  no obvious masses on inspection  LUNGS: clear to auscultation bilaterally, no wheezes, rales or rhonchi, good air movement  CV: HRRR, no peripheral edema  ABD: BS+, soft, NTTP, no rebound or guarding  MS: moves all extremities without noticeable abnormality, mild TTP in the R mid lower back muscles  PSYCH: pleasant and cooperative, no obvious depression or anxiety  ASSESSMENT AND PLAN:  Discussed the following assessment and plan:  Dysuria - Plan: Basic metabolic panel, CBC with Differential/Platelet, Urine culture, POC Urinalysis Dipstick  Hematuria, unspecified type - Plan: CT RENAL STONE STUDY  Nausea - Plan: Basic metabolic panel, CBC with Differential/Platelet  Flank pain  -we discussed possible serious and likely etiologies, workup and treatment, treatment risks and return precautions; udip with blood and tr leuks -after this discussion, Donald [name] opted for starting abx and opted for cipro 500bid, Rocephin IM today, CT scan (ordered STAT),  water, labs and prompt follow up or is to contact his urologist if worsening or not improving, difficulty voiding or new concerns   Patient Instructions  BEFORE YOU LEAVE: Information regarding CT   Get the CT scan  Take the antibiotic as instructed  Seek care or contact your urologist immediately if worsening, new concerns, or not improving with treatment.  We have ordered labs and a CT scan at this visit. It can take up to 1-2 weeks for results and processing. IF results require follow up or explanation, we will call you with instructions. Clinically stable results will be released to your [facility]. If you have not heard from [place] or cannot find your results in [facility] in 2 weeks please contact our office at [phone].  If you are not yet signed up for [facility], please consider signing up.           Colin Benton R., DO

## 2016-06-26 NOTE — Patient Instructions (Signed)
BEFORE YOU LEAVE: Information regarding CT   Get the CT scan  Take the antibiotic as instructed  Seek care or contact your urologist immediately if worsening, new concerns, or not improving with treatment.  We have ordered labs and a CT scan at this visit. It can take up to 1-2 weeks for results and processing. IF results require follow up or explanation, we will call you with instructions. Clinically stable results will be released to your [facility]. If you have not heard from [place] or cannot find your results in [facility] in 2 weeks please contact our office at [phone].  If you are not yet signed up for [facility], please consider signing up.

## 2016-06-26 NOTE — Addendum Note (Signed)
Addended by: Agnes Lawrence on: 06/26/2016 01:34 PM   Modules accepted: Orders

## 2016-06-26 NOTE — Addendum Note (Signed)
Addended by: Agnes Lawrence on: 06/26/2016 04:50 PM   Modules accepted: Orders

## 2016-06-26 NOTE — Progress Notes (Signed)
Pre visit review using our clinic review tool, if applicable. No additional management support is needed unless otherwise documented below in the visit note. 

## 2016-06-27 ENCOUNTER — Ambulatory Visit (INDEPENDENT_AMBULATORY_CARE_PROVIDER_SITE_OTHER)
Admission: RE | Admit: 2016-06-27 | Discharge: 2016-06-27 | Disposition: A | Discharge status: Home / Self Care | Payer: PPO | Source: Ambulatory Visit | Attending: Family Medicine | Admitting: Family Medicine

## 2016-06-27 DIAGNOSIS — R319 Hematuria, unspecified: Secondary | ICD-10-CM | POA: Diagnosis not present

## 2016-06-27 DIAGNOSIS — R109 Unspecified abdominal pain: Secondary | ICD-10-CM | POA: Diagnosis not present

## 2016-06-27 LAB — URINE CULTURE: Organism ID, Bacteria: NO GROWTH

## 2016-06-28 DIAGNOSIS — R310 Gross hematuria: Secondary | ICD-10-CM | POA: Diagnosis not present

## 2016-06-28 DIAGNOSIS — D49512 Neoplasm of unspecified behavior of left kidney: Secondary | ICD-10-CM | POA: Diagnosis not present

## 2016-06-28 DIAGNOSIS — N201 Calculus of ureter: Secondary | ICD-10-CM | POA: Diagnosis not present

## 2016-06-28 DIAGNOSIS — N132 Hydronephrosis with renal and ureteral calculous obstruction: Secondary | ICD-10-CM | POA: Diagnosis not present

## 2016-07-03 ENCOUNTER — Encounter: Payer: Self-pay | Admitting: Family Medicine

## 2016-07-17 ENCOUNTER — Ambulatory Visit (INDEPENDENT_AMBULATORY_CARE_PROVIDER_SITE_OTHER): Payer: PPO | Admitting: Interventional Cardiology

## 2016-07-17 ENCOUNTER — Encounter: Payer: Self-pay | Admitting: Interventional Cardiology

## 2016-07-17 VITALS — BP 120/72 | HR 46 | Ht 65.0 in | Wt 174.2 lb

## 2016-07-17 DIAGNOSIS — E782 Mixed hyperlipidemia: Secondary | ICD-10-CM

## 2016-07-17 DIAGNOSIS — I25119 Atherosclerotic heart disease of native coronary artery with unspecified angina pectoris: Secondary | ICD-10-CM | POA: Diagnosis not present

## 2016-07-17 DIAGNOSIS — I10 Essential (primary) hypertension: Secondary | ICD-10-CM

## 2016-07-17 DIAGNOSIS — R42 Dizziness and giddiness: Secondary | ICD-10-CM | POA: Diagnosis not present

## 2016-07-17 DIAGNOSIS — I4892 Unspecified atrial flutter: Secondary | ICD-10-CM | POA: Diagnosis not present

## 2016-07-17 MED ORDER — METOPROLOL TARTRATE 25 MG PO TABS: 12.5000 mg | ORAL_TABLET | Freq: Every day | ORAL | 3 refills | Status: DC

## 2016-07-17 NOTE — Progress Notes (Signed)
Patient ID: Donald [name], male   DOB: 02-29-1946, 71 y.o.   MRN: [id number]     Cardiology Office Note   Date:  07/17/2016   ID:  JEFFRY VOGELSANG, DOB 05/08/1945, MRN [id number]  PCP:  Lucretia Kern, DO    No chief complaint on file. f/u CAD   Wt Readings from Last 3 Encounters:  07/17/16 174 lb 3.2 oz (79 kg)  06/26/16 174 lb (78.9 kg)  09/01/15 174 lb (78.9 kg)       History of Present Illness: Donald [name] is a 71 y.o. male  who has CAD, CABG in 2001and a h/o AFlutter. He had an ablation for atrial flutter in 2011, and several years before that in [place].  He is off of coumadin.   He exercises 4x/week, mostly weights. He wants to increase cardio exercises- starting the treadmill. Cardio limited by right knee pain- no surgery planned. He recently had a shot in his left knee.  He does some resistance training, increasing cardio.  Denies : Chest pain. Diaphoresis. Leg edema. Nitroglycerin. Orthopnea. Palpitations. Syncope.   He has some dizziness when going from sitting to standing.  He has not passed out.  In the past month, he had a problem with a kidney stone.  He has been more dizzy since starting Flomax a few weeks ago.      Past Medical History:  Diagnosis Date  . Allergic rhinitis   . Arrhythmia    hx of arrythmia resovled after ablation  . BPH (benign prostatic hyperplasia)   . Chicken pox   . Chronic wrist pain   . Colon polyp   . Coronary artery disease   . Heart disease   . High cholesterol    goal LDL less than 70  . History of basal cell carcinoma    left upper back, right chest   . History of MI (myocardial infarction)    2004  . Hypertension   . Obesity   . Seasonal allergies     Past Surgical History:  Procedure Laterality Date  . CORONARY ARTERY BYPASS GRAFT    . ROTATOR CUFF REPAIR    . WRIST SURGERY       Current Outpatient Prescriptions  Medication Sig Dispense Refill  . aspirin 81 MG tablet Take 81 mg by mouth daily.    Marland Kitchen atorvastatin  (LIPITOR) 80 MG tablet Take 40 mg by mouth daily.     Marland Kitchen HYDROcodone-acetaminophen (NORCO/VICODIN) 5-325 MG tablet Take 1-2 tabs orally every 6 hours as needed for pain.    . metoprolol tartrate (LOPRESSOR) 25 MG tablet Take 0.5 tablets (12.5 mg total) by mouth daily. 90 tablet 3  . Multiple Vitamin (MULTIVITAMIN) tablet Take 1 tablet by mouth daily.    . Omega-3 Fatty Acids (FISH OIL) 1000 MG CAPS Take 1 capsule by mouth daily.     . Red Yeast Rice Extract (RED YEAST RICE PO) Take 1 capsule by mouth daily.     . tamsulosin (FLOMAX) 0.4 MG CAPS capsule Take 0.4 mg by mouth daily.     No current facility-administered medications for this visit.     Allergies:   Patient has no known allergies.    Social History:  The patient  reports that he has never smoked. He uses smokeless tobacco. He reports that he drinks alcohol. He reports that he does not use drugs.   Family History:  The patient's family history includes Cancer in his father; Heart attack in his  mother.    ROS:  Please see the history of present illness.   Otherwise, review of systems are positive for knee pain.   All other systems are reviewed and negative.    PHYSICAL EXAM: VS:  BP 120/72   Pulse (!) 46   Ht 5\' 5"  (1.651 m)   Wt 174 lb 3.2 oz (79 kg)   BMI 28.99 kg/m  , BMI Body mass index is 28.99 kg/m. GEN: Well nourished, well developed, in no acute distress  HEENT: normal  Neck: no JVD, carotid bruits, or masses Cardiac: bradycardic; no murmurs, rubs, or gallops,no edema  Respiratory:  clear to auscultation bilaterally, normal work of breathing GI: soft, nontender, nondistended, + BS MS: no deformity or atrophy  Skin: warm and dry, no rash Neuro:  Strength and sensation are intact Psych: euthymic mood, full affect   EKG:   The ekg ordered today demonstrates Sinus bradycardia, RBBB- no change from prior   Recent Labs: 08/11/2015: ALT 15 06/26/2016: BUN 20; Creatinine, Ser 1.08; Hemoglobin 12.9; Platelets  185.0; Potassium 3.9; Sodium 139   Lipid Panel    Component Value Date/Time   HDL 49 05/20/2014   LDLCALC 59 05/20/2014     Other studies Reviewed: Additional studies/ records that were reviewed today with results demonstrating: Cr 1.08 in 4/18   ASSESSMENT AND PLAN:  1. Bradycardia: Decreased metoprolol in the past.  Continue current low dose.  After starting Flomax, he has lightheadedness.  If symptoms persist off Flomax which will presumably be stopped after the kidney stone passes, would stop metoprolol altogether. Lipids and liver tests well controlled.  Done at the [place] - in 4/18 , LDL 57, HDL 57, TG 59, TC 126.  Continue atorva and red yeast rice combo.  2. Hyperlipidemia: As noted above.  On atorvastatin from the [place]. Tolerating this well.  3. CAD: No angina on medical therapy. No NTG use. A1C 6.0 in 4/18. 4. Hematuria: Intermittent.  Related to kidney stone, now on FLomax to help pass the stone. 5. HTN: BP controlled.  6. Atral flutter: No sx of recurrence. He has been asymptomatic in the past with his Atrial flutter.  Off of coumadin post ablation x 2, last in 2011 with Dr. Rayann Heman. He was symptomatic at that time with tachycardia. ECG shows sinus brady   Current medicines are reviewed at length with the patient today.  The patient concerns regarding his medicines were addressed.  The following changes have been made:  As above  Labs/ tests ordered today include:   No orders of the defined types were placed in this encounter.   Recommend 150 minutes/week of aerobic exercise Low fat, low carb, high fiber diet recommended  Disposition:   FU in 6 months   Signed, Larae Grooms, MD  07/17/2016 9:40 AM    [facility] [address], [place], [place]  [address] Phone: [phone]; Fax: [phone]

## 2016-07-17 NOTE — Patient Instructions (Signed)

## 2016-07-19 DIAGNOSIS — D49512 Neoplasm of unspecified behavior of left kidney: Secondary | ICD-10-CM | POA: Diagnosis not present

## 2016-07-19 DIAGNOSIS — N281 Cyst of kidney, acquired: Secondary | ICD-10-CM | POA: Diagnosis not present

## 2016-07-19 DIAGNOSIS — N201 Calculus of ureter: Secondary | ICD-10-CM | POA: Diagnosis not present

## 2016-07-19 DIAGNOSIS — R3911 Hesitancy of micturition: Secondary | ICD-10-CM | POA: Diagnosis not present

## 2016-07-20 ENCOUNTER — Other Ambulatory Visit: Payer: Self-pay | Admitting: Urology

## 2016-07-31 ENCOUNTER — Ambulatory Visit: Payer: PPO | Admitting: Interventional Cardiology

## 2016-08-02 ENCOUNTER — Encounter (HOSPITAL_BASED_OUTPATIENT_CLINIC_OR_DEPARTMENT_OTHER): Payer: Self-pay | Admitting: *Deleted

## 2016-08-02 NOTE — Progress Notes (Signed)
NPO AFTER MN.  PT VERBALIZED UNDERSTANDING INCLUDES NO DIP TOBACCO.  ARRIVE AT 1015.  NEEDS ISTAT 8.  CURRENT EKG IN CHART AND EPIC.  WILL TAKE FLOMAX AND METOPROLOL AM DOS W/ SIPS OF WATER.

## 2016-08-10 ENCOUNTER — Ambulatory Visit (HOSPITAL_BASED_OUTPATIENT_CLINIC_OR_DEPARTMENT_OTHER): Payer: PPO | Admitting: Anesthesiology

## 2016-08-10 ENCOUNTER — Ambulatory Visit (HOSPITAL_BASED_OUTPATIENT_CLINIC_OR_DEPARTMENT_OTHER)
Admission: RE | Admit: 2016-08-10 | Discharge: 2016-08-10 | Disposition: A | Discharge status: Home / Self Care | Payer: PPO | Source: Ambulatory Visit | Attending: Urology | Admitting: Urology

## 2016-08-10 ENCOUNTER — Encounter (HOSPITAL_BASED_OUTPATIENT_CLINIC_OR_DEPARTMENT_OTHER): Payer: Self-pay | Admitting: Anesthesiology

## 2016-08-10 ENCOUNTER — Encounter (HOSPITAL_BASED_OUTPATIENT_CLINIC_OR_DEPARTMENT_OTHER)
Admission: RE | Disposition: A | Discharge status: Home / Self Care | Payer: Self-pay | Source: Ambulatory Visit | Attending: Urology

## 2016-08-10 DIAGNOSIS — I10 Essential (primary) hypertension: Secondary | ICD-10-CM | POA: Diagnosis not present

## 2016-08-10 DIAGNOSIS — Z711 Person with feared health complaint in whom no diagnosis is made: Secondary | ICD-10-CM | POA: Diagnosis not present

## 2016-08-10 DIAGNOSIS — N201 Calculus of ureter: Secondary | ICD-10-CM | POA: Diagnosis not present

## 2016-08-10 DIAGNOSIS — Z79899 Other long term (current) drug therapy: Secondary | ICD-10-CM | POA: Diagnosis not present

## 2016-08-10 DIAGNOSIS — E785 Hyperlipidemia, unspecified: Secondary | ICD-10-CM | POA: Diagnosis not present

## 2016-08-10 DIAGNOSIS — Z7982 Long term (current) use of aspirin: Secondary | ICD-10-CM | POA: Diagnosis not present

## 2016-08-10 DIAGNOSIS — N400 Enlarged prostate without lower urinary tract symptoms: Secondary | ICD-10-CM | POA: Diagnosis not present

## 2016-08-10 DIAGNOSIS — Z466 Encounter for fitting and adjustment of urinary device: Secondary | ICD-10-CM | POA: Diagnosis not present

## 2016-08-10 HISTORY — DX: Other specified postprocedural states: Z98.890

## 2016-08-10 HISTORY — DX: Personal history of other malignant neoplasm of skin: Z85.828

## 2016-08-10 HISTORY — DX: Presence of spectacles and contact lenses: Z97.3

## 2016-08-10 HISTORY — DX: Calculus of ureter: N20.1

## 2016-08-10 HISTORY — DX: Personal history of colonic polyps: Z86.010

## 2016-08-10 HISTORY — PX: CYSTOSCOPY WITH RETROGRADE PYELOGRAM, URETEROSCOPY AND STENT PLACEMENT: SHX5789

## 2016-08-10 HISTORY — DX: Hyperlipidemia, unspecified: E78.5

## 2016-08-10 HISTORY — DX: Presence of aortocoronary bypass graft: Z95.1

## 2016-08-10 HISTORY — DX: Neoplasm of uncertain behavior of left renal pelvis: D41.12

## 2016-08-10 HISTORY — DX: Personal history of colon polyps, unspecified: Z86.0100

## 2016-08-10 HISTORY — DX: Unspecified right bundle-branch block: I45.10

## 2016-08-10 HISTORY — DX: Unspecified hydronephrosis: N13.30

## 2016-08-10 HISTORY — DX: Bradycardia, unspecified: R00.1

## 2016-08-10 HISTORY — DX: Hematuria, unspecified: R31.9

## 2016-08-10 HISTORY — DX: Personal history of other diseases of the circulatory system: Z86.79

## 2016-08-10 LAB — POCT I-STAT, CHEM 8
BUN: 20 mg/dL (ref 6–20)
Calcium, Ion: 1.23 mmol/L (ref 1.15–1.40)
Chloride: 105 mmol/L (ref 101–111)
Creatinine, Ser: 0.8 mg/dL (ref 0.61–1.24)
Glucose, Bld: 104 mg/dL — ABNORMAL HIGH (ref 65–99)
HCT: 39 % (ref 39.0–52.0)
Hemoglobin: 13.3 g/dL (ref 13.0–17.0)
Potassium: 4.3 mmol/L (ref 3.5–5.1)
Sodium: 140 mmol/L (ref 135–145)
TCO2: 27 mmol/L (ref 0–100)

## 2016-08-10 SURGERY — CYSTOURETEROSCOPY, WITH RETROGRADE PYELOGRAM AND STENT INSERTION
Anesthesia: General | Site: Ureter | Laterality: Right

## 2016-08-10 MED ORDER — PROPOFOL 10 MG/ML IV BOLUS
INTRAVENOUS | Status: AC
  Filled 2016-08-10: qty 20

## 2016-08-10 MED ORDER — KETOROLAC TROMETHAMINE 10 MG PO TABS: 10.0000 mg | ORAL_TABLET | Freq: Four times a day (QID) | ORAL | 1 refills | Status: DC | PRN

## 2016-08-10 MED ORDER — FENTANYL CITRATE (PF) 100 MCG/2ML IJ SOLN
25.0000 ug | INTRAMUSCULAR | Status: DC | PRN
Start: 2016-08-10 — End: 2016-08-10
  Filled 2016-08-10: qty 1

## 2016-08-10 MED ORDER — EPHEDRINE SULFATE-NACL 50-0.9 MG/10ML-% IV SOSY
PREFILLED_SYRINGE | INTRAVENOUS | Status: DC | PRN
  Administered 2016-08-10 (×2): 10 mg via INTRAVENOUS

## 2016-08-10 MED ORDER — SENNOSIDES-DOCUSATE SODIUM 8.6-50 MG PO TABS: 1.0000 | ORAL_TABLET | Freq: Two times a day (BID) | ORAL | 0 refills | Status: DC

## 2016-08-10 MED ORDER — OXYCODONE HCL 5 MG PO TABS
5.0000 mg | ORAL_TABLET | Freq: Once | ORAL | Status: DC | PRN
  Filled 2016-08-10: qty 1

## 2016-08-10 MED ORDER — LIDOCAINE 2% (20 MG/ML) 5 ML SYRINGE
INTRAMUSCULAR | Status: AC
  Filled 2016-08-10: qty 5

## 2016-08-10 MED ORDER — SODIUM CHLORIDE 0.9 % IR SOLN
Status: DC | PRN
  Administered 2016-08-10 (×2): 3000 mL

## 2016-08-10 MED ORDER — FENTANYL CITRATE (PF) 100 MCG/2ML IJ SOLN
INTRAMUSCULAR | Status: AC
  Filled 2016-08-10: qty 2

## 2016-08-10 MED ORDER — DEXAMETHASONE SODIUM PHOSPHATE 10 MG/ML IJ SOLN
INTRAMUSCULAR | Status: AC
  Filled 2016-08-10: qty 1

## 2016-08-10 MED ORDER — GENTAMICIN IN SALINE 1.6-0.9 MG/ML-% IV SOLN
80.0000 mg | INTRAVENOUS | Status: DC
  Filled 2016-08-10: qty 50

## 2016-08-10 MED ORDER — OXYCODONE HCL 5 MG/5ML PO SOLN
5.0000 mg | Freq: Once | ORAL | Status: DC | PRN
  Filled 2016-08-10: qty 5

## 2016-08-10 MED ORDER — PROPOFOL 10 MG/ML IV BOLUS
INTRAVENOUS | Status: DC | PRN
  Administered 2016-08-10: 150 mg via INTRAVENOUS
  Administered 2016-08-10: 20 mg via INTRAVENOUS

## 2016-08-10 MED ORDER — FENTANYL CITRATE (PF) 100 MCG/2ML IJ SOLN
INTRAMUSCULAR | Status: DC | PRN
  Administered 2016-08-10: 50 ug via INTRAVENOUS

## 2016-08-10 MED ORDER — HYDROCODONE-ACETAMINOPHEN 5-325 MG PO TABS: 1.0000 | ORAL_TABLET | Freq: Four times a day (QID) | ORAL | 0 refills | Status: DC | PRN

## 2016-08-10 MED ORDER — KETOROLAC TROMETHAMINE 30 MG/ML IJ SOLN
INTRAMUSCULAR | Status: AC
  Filled 2016-08-10: qty 1

## 2016-08-10 MED ORDER — LIDOCAINE 2% (20 MG/ML) 5 ML SYRINGE
INTRAMUSCULAR | Status: DC | PRN
  Administered 2016-08-10: 60 mg via INTRAVENOUS

## 2016-08-10 MED ORDER — ONDANSETRON HCL 4 MG/2ML IJ SOLN
INTRAMUSCULAR | Status: AC
  Filled 2016-08-10: qty 2

## 2016-08-10 MED ORDER — ONDANSETRON HCL 4 MG/2ML IJ SOLN
INTRAMUSCULAR | Status: DC | PRN
  Administered 2016-08-10: 4 mg via INTRAVENOUS

## 2016-08-10 MED ORDER — LACTATED RINGERS IV SOLN
INTRAVENOUS | Status: DC
  Administered 2016-08-10: 11:00:00 via INTRAVENOUS
  Filled 2016-08-10: qty 1000

## 2016-08-10 MED ORDER — ONDANSETRON HCL 4 MG/2ML IJ SOLN
4.0000 mg | Freq: Once | INTRAMUSCULAR | Status: DC | PRN
Start: 2016-08-10 — End: 2016-08-10
  Filled 2016-08-10: qty 2

## 2016-08-10 MED ORDER — IOHEXOL 300 MG/ML  SOLN
INTRAMUSCULAR | Status: DC | PRN
  Administered 2016-08-10: 10 mL

## 2016-08-10 MED ORDER — DEXAMETHASONE SODIUM PHOSPHATE 10 MG/ML IJ SOLN
INTRAMUSCULAR | Status: DC | PRN
  Administered 2016-08-10: 10 mg via INTRAVENOUS

## 2016-08-10 MED ORDER — GENTAMICIN SULFATE 40 MG/ML IJ SOLN
5.0000 mg/kg | INTRAMUSCULAR | Status: AC
  Administered 2016-08-10: 340 mg via INTRAVENOUS
  Filled 2016-08-10: qty 8.5

## 2016-08-10 MED ORDER — CEPHALEXIN 500 MG PO CAPS
500.0000 mg | ORAL_CAPSULE | Freq: Two times a day (BID) | ORAL | 0 refills | Status: DC
Start: 2016-08-10 — End: 2017-10-24

## 2016-08-10 MED ORDER — EPHEDRINE 5 MG/ML INJ
INTRAVENOUS | Status: AC
Start: 2016-08-10 — End: 2016-08-10
  Filled 2016-08-10: qty 10

## 2016-08-10 SURGICAL SUPPLY — 26 items
BAG DRAIN URO-CYSTO SKYTR STRL (DRAIN) ×4 IMPLANT
BASKET LASER NITINOL 1.9FR (BASKET) IMPLANT
BASKET ZERO TIP NITINOL 2.4FR (BASKET) IMPLANT
CATH INTERMIT  6FR 70CM (CATHETERS) ×4 IMPLANT
CLOTH BEACON ORANGE TIMEOUT ST (SAFETY) ×4 IMPLANT
GLOVE BIO SURGEON STRL SZ7.5 (GLOVE) ×4 IMPLANT
GOWN STRL REUS W/ TWL LRG LVL3 (GOWN DISPOSABLE) ×4 IMPLANT
GOWN STRL REUS W/ TWL XL LVL3 (GOWN DISPOSABLE) ×2 IMPLANT
GOWN STRL REUS W/TWL LRG LVL3 (GOWN DISPOSABLE) ×4
GOWN STRL REUS W/TWL XL LVL3 (GOWN DISPOSABLE) ×2
GUIDEWIRE ANG ZIPWIRE 038X150 (WIRE) ×4 IMPLANT
GUIDEWIRE STR DUAL SENSOR (WIRE) ×4 IMPLANT
IV NS 1000ML (IV SOLUTION)
IV NS 1000ML BAXH (IV SOLUTION) IMPLANT
IV NS IRRIG 3000ML ARTHROMATIC (IV SOLUTION) ×8 IMPLANT
KIT RM TURNOVER CYSTO AR (KITS) ×4 IMPLANT
MANIFOLD NEPTUNE II (INSTRUMENTS) ×4 IMPLANT
NS IRRIG 500ML POUR BTL (IV SOLUTION) IMPLANT
PACK CYSTO (CUSTOM PROCEDURE TRAY) ×4 IMPLANT
SHEATH ACCESS URETERAL 38CM (SHEATH) ×4 IMPLANT
STENT POLARIS LOOP 6FR X 26 CM (STENTS) ×4 IMPLANT
SYRINGE 10CC LL (SYRINGE) ×4 IMPLANT
TUBE CONNECTING 12'X1/4 (SUCTIONS) ×1
TUBE CONNECTING 12X1/4 (SUCTIONS) ×3 IMPLANT
TUBE FEEDING 8FR 16IN STR KANG (MISCELLANEOUS) ×4 IMPLANT
WATER STERILE IRR 500ML POUR (IV SOLUTION) ×4 IMPLANT

## 2016-08-10 NOTE — Op Note (Signed)
NAME:  Donald, [name].:  MEDICAL RECORD NO.:  [id number]  LOCATION:                                 FACILITY:  PHYSICIAN:  Alexis Frock, MD          DATE OF BIRTH:  DATE OF PROCEDURE: 08/10/2016                              OPERATIVE REPORT   PREOPERATIVE DIAGNOSIS:  Right distal ureteral stone, favor medical therapy.  POSTOPERATIVE DIAGNOSIS:  Right ureteral stone, status post passage.  PROCEDURES: 1. Cystoscopy with right retrograde pyelogram interpretation. 2. Right diagnostic ureteroscopy. 3. Insertion of right ureteral stent, 6 x 26 Polaris with tether.  ESTIMATED BLOOD LOSS:  Nil.  COMPLICATION:  None.  SPECIMEN:  None.  FINDINGS: 1. Moderate trilobar prostatic hypertrophy. 2. No evidence of right intraluminal stone with inspection of the     right ureter and kidney x3. 3. Successful placement of right ureteral stent, proximal in renal     pelvis and distal in the urinary bladder.  INDICATION:  Donald [name] is a very pleasant 71 year old gentleman, recent history of right distal ureteral stone and colic that has been intermittent and he has been on medical therapy and failed to pass this stone definitively.  He has been surveilled with a KUB x-ray and has had a persistent calcification corresponding to location of his prior right distal ureteral stone.  Options were discussed for further management including continued medical therapy versus shockwave lithotripsy versus ureteroscopy and he adamantly wished to proceed with the latter. Informed consent was obtained and placed in the medical record.  PROCEDURE IN DETAIL:  The patient being, Donald [name], was verified. Procedure being, right ureteroscopic stone manipulation, was confirmed. Procedure was carried out.  Time-out was performed.  Intravenous antibiotics were administered.  General anesthesia was introduced.  The patient was placed into a low lithotomy position.  Sterile field  was created by prepping and draping the patient's penis, perineum, proximal thighs using iodine.  Next, cystourethroscopy was performed using a rigid cystoscope with offset lens.  Inspection of anterior and posterior urethra revealed moderate trilobar prostatic hypertrophy, very mild bladder trabeculation.  Ureteral orifices were singleton bilaterally. The right ureteral orifice was cannulated with a 6-French end-hole catheter and right retrograde pyelogram was obtained.  Right retrograde pyelogram demonstrated a single right ureter with single-system right kidney.  There was a questionable filling defect at the area of the ureterovesical junction, but no hydronephrosis noted.  A 0.038 ZIPwire was advanced to the level of the upper pole, set aside as a safety wire.  An 8-French feeding tube placed in urinary bladder for pressure release.  Next, semi-rigid ureteroscopy from the distal right ureter alongside a separate Sensor working wire.  There were no mucosal abnormalities seen whatsoever.  There was no obvious distal ureteral stone.  This was felt possibly represent retrograde positioning of prior stone as the goal today was to verify a stone free status.  The semi- rigid scope was exchanged for a 12/14, 38-cm ureteral access sheath at the level of proximal ureter using continuous fluoroscopic guidance and flexible digital ureteroscopy performed at the proximal ureter. Systematic inspection of the right kidney  including all calices x3. There was no evidence of intraluminal urolithiasis on the right side whatsoever.  It is also most likely corresponded to interval passage of right ureteral stone.  Spot fluoroscopic images then did reveal a persistent phlebolith that did appear to be just lateral to the right distal ureter by about 2 cm or so and likely corresponded to the calcification previously seen on KUB x-ray.  As we achieved the goals of procedure today verifying stone free, the  access sheath was removed under continuous vision.  No significant mucosal abnormalities were found and finally, a new 6 x 26 primary Polaris type stent was placed in a safety wire using fluoroscopic guidance.  Good proximal and distal deployment were noted.  The tether was left in place, fashioned to the dorsum of the penis.  Procedure was terminated.  The patient tolerated the procedure well.  There were no immediate periprocedural complications.  The patient was taken to the postanesthesia care unit in stable condition.          ______________________________ Alexis Frock, MD     TM/MEDQ  D:  08/10/2016  T:  08/10/2016  Job:  [id number]

## 2016-08-10 NOTE — Transfer of Care (Signed)
Immediate Anesthesia Transfer of Care Note  Patient: Donald [name]  Procedure(s) Performed: Procedure(s): CYSTOSCOPY WITH RETROGRADE PYELOGRAM, URETEROSCOPY AND STENT PLACEMENT (Right)  Patient Location: PACU  Anesthesia Type:General  Level of Consciousness: awake, alert  and oriented  Airway & Oxygen Therapy: Patient Spontanous Breathing and Patient connected to nasal cannula oxygen  Post-op Assessment: Report given to RN  Post vital signs: Reviewed and stable  Last Vitals: 123/79, 54, 10, 100%, 97.7 Vitals:   08/10/16 1027  BP: 123/66  Pulse: (!) 55  Resp: 17  Temp: 36.4 C    Last Pain:  Vitals:   08/10/16 1027  TempSrc: Oral      Patients Stated Pain Goal: 7 (83/41/96 2229)  Complications: No apparent anesthesia complications

## 2016-08-10 NOTE — Discharge Instructions (Signed)
[facility] °[phone] °Post Ureteroscopy With or Without Stent Instructions ° °Definitions: ° °Ureter: The duct that transports urine from the kidney to the bladder. °Stent:   A plastic hollow tube that is placed into the ureter, from the kidney to the                 bladder to prevent the ureter from swelling shut. ° °GENERAL INSTRUCTIONS: ° °Despite the fact that no skin incisions were used, the area around the ureter and bladder is raw and irritated. The stent is a foreign body which will further irritate the bladder wall. This irritation is manifested by increased frequency of urination, both day and night, and by an increase in the urge to urinate. In some, the urge to urinate is present almost always. Sometimes the urge is strong enough that you may not be able to stop yourself from urinating. The only real cure is to remove the stent and then give time for the bladder wall to heal which can't be done until the danger of the ureter swelling shut has passed, which varies. ° °You may see some blood in your urine while the stent is in place and a few days afterwards. Do not be alarmed, even if the urine was clear for a while. Get off your feet and drink lots of fluids until clearing occurs. If you start to pass clots or don't improve, call us. ° °DIET: °You may return to your normal diet immediately. Because of the raw surface of your bladder, alcohol, spicy foods, acid type foods and drinks with caffeine may cause irritation or frequency and should be used in moderation. To keep your urine flowing freely and to avoid constipation, drink plenty of fluids during the day ( 8-10 glasses ). °Tip: Avoid cranberry juice because it is very acidic. ° °ACTIVITY: °Your physical activity doesn't need to be restricted. However, if you are very active, you may see some blood in your urine. We suggest that you reduce your activity under these circumstances until the bleeding has stopped. ° °BOWELS: °It is  important to keep your bowels regular during the postoperative period. Straining with bowel movements can cause bleeding. A bowel movement every other day is reasonable. Use a mild laxative if needed, such as Milk of Magnesia 2-3 tablespoons, or 2 Dulcolax tablets. Call if you continue to have problems. If you have been taking narcotics for pain, before, during or after your surgery, you may be constipated. Take a laxative if necessary. ° ° °MEDICATION: °You should resume your pre-surgery medications unless told not to. In addition you will often be given an antibiotic to prevent infection. These should be taken as prescribed until the bottles are finished unless you are having an unusual reaction to one of the drugs. ° °PROBLEMS YOU SHOULD REPORT TO US: °· Fevers over 100.5 Fahrenheit. °· Heavy bleeding, or clots ( See above notes about blood in urine ). °· Inability to urinate. °· Drug reactions ( hives, rash, nausea, vomiting, diarrhea ). °· Severe burning or pain with urination that is not improving. ° °FOLLOW-UP: °You will need a follow-up appointment to monitor your progress. Call for this appointment at the number listed above. Usually the first appointment will be about three to fourteen days after your surgery. ° ° ° ° ° °Post Anesthesia Home Care Instructions ° °Activity: °Get plenty of rest for the remainder of the day. A responsible individual must stay with you for 24 hours following the procedure.  °  For the next 24 hours, DO NOT: -Drive a car -Paediatric nurse -Drink alcoholic beverages -Take any medication unless instructed by your physician -Make any legal decisions or sign important papers.  Meals: Start with liquid foods such as gelatin or soup. Progress to regular foods as tolerated. Avoid greasy, spicy, heavy foods. If nausea and/or vomiting occur, drink only clear liquids until the nausea and/or vomiting subsides. Call your physician if vomiting continues.  Special  Instructions/Symptoms: Your throat may feel dry or sore from the anesthesia or the breathing tube placed in your throat during surgery. If this causes discomfort, gargle with warm salt water. The discomfort should disappear within 24 hours.  If you had a scopolamine patch placed behind your ear for the management of post- operative nausea and/or vomiting:  1. The medication in the patch is effective for 72 hours, after which it should be removed.  Wrap patch in a tissue and discard in the trash. Wash hands thoroughly with soap and water. 2. You may remove the patch earlier than 72 hours if you experience unpleasant side effects which may include dry mouth, dizziness or visual disturbances. 3. Avoid touching the patch. Wash your hands with soap and water after contact with the patch.   1 - You may have urinary urgency (bladder spasms) and bloody urine on / off with stent in place. This is normal.  2 - Remove tethered stent on Tuesday morning at home by pulling on string, then blue-white plastic tubing, and discarding. Office is open Tuesday if any issues arise.  3 - Call MD or go to ER for fever >102, severe pain / nausea / vomiting not relieved by medications, or acute change in medical status

## 2016-08-10 NOTE — Brief Op Note (Signed)
08/10/2016  1:19 PM  PATIENT:  Donald [name]  71 y.o. male  PRE-OPERATIVE DIAGNOSIS:  RIGHT URETERAL STONE  POST-OPERATIVE DIAGNOSIS:  RIGHT URETERAL STONE  PROCEDURE:  Procedure(s): CYSTOSCOPY WITH RETROGRADE PYELOGRAM, URETEROSCOPY AND STENT PLACEMENT (Right)  SURGEON:  Surgeon(s) and Role:    * Alexis Frock, MD - Primary  PHYSICIAN ASSISTANT:   ASSISTANTS: none   ANESTHESIA:   general  EBL:  Total I/O In: 200 [I.V.:200] Out: 0   BLOOD ADMINISTERED:none  DRAINS: none   LOCAL MEDICATIONS USED:  NONE  SPECIMEN:  No Specimen  DISPOSITION OF SPECIMEN:  N/A  COUNTS:  YES  TOURNIQUET:  * No tourniquets in log *  DICTATION: .Other Dictation: Dictation Number [id number]  PLAN OF CARE: Discharge to home after PACU  PATIENT DISPOSITION:  PACU - hemodynamically stable.   Delay start of Pharmacological VTE agent (>24hrs) due to surgical blood loss or risk of bleeding: yes

## 2016-08-10 NOTE — Anesthesia Procedure Notes (Signed)
Procedure Name: LMA Insertion Date/Time: 08/10/2016 12:50 PM Performed by: Bethena Roys T Pre-anesthesia Checklist: Patient identified, Emergency Drugs available, Suction available and Patient being monitored Patient Re-evaluated:Patient Re-evaluated prior to inductionOxygen Delivery Method: Circle system utilized Preoxygenation: Pre-oxygenation with 100% oxygen Intubation Type: IV induction Ventilation: Mask ventilation without difficulty LMA: LMA inserted LMA Size: 5.0 Number of attempts: 1 Airway Equipment and Method: Bite block Placement Confirmation: positive ETCO2 Tube secured with: Tape Dental Injury: Teeth and Oropharynx as per pre-operative assessment

## 2016-08-10 NOTE — Anesthesia Postprocedure Evaluation (Signed)
Anesthesia Post Note  Patient: Donald [name]  Procedure(s) Performed: Procedure(s) (LRB): CYSTOSCOPY WITH RETROGRADE PYELOGRAM, URETEROSCOPY AND STENT PLACEMENT (Right)     Patient location during evaluation: PACU Anesthesia Type: General Level of consciousness: awake, awake and alert and oriented Pain management: pain level controlled Vital Signs Assessment: post-procedure vital signs reviewed and stable Respiratory status: spontaneous breathing, nonlabored ventilation and respiratory function stable Cardiovascular status: blood pressure returned to baseline Anesthetic complications: no    Last Vitals:  Vitals:   08/10/16 1331 08/10/16 1345  BP: 123/79 129/64  Pulse: (!) 55 (!) 50  Resp: 10 10  Temp: 36.5 C     Last Pain:  Vitals:   08/10/16 1027  TempSrc: Oral                 Jarrette Dehner,Khyan COKER

## 2016-08-10 NOTE — H&P (Signed)
Donald [name] is an 71 y.o. male.    Chief Complaint: Pre-op RIGHT Ureteroscopic Stone Manipulation  HPI:   1 - Small Right Ureteral Stone - 63mm Rt UVJ stone that has failed medical therapy x several weeks. UA withtou infectious parameters.   Today "Donald [name]" is seen to proceed with RIGHT ureteroscopy for small distal stone that has failed medical therapy. No interval fevers.   Past Medical History:  Diagnosis Date  . Allergic rhinitis   . BPH (benign prostatic hyperplasia)   . Bradycardia   . Coronary artery disease    CARDIOLOGIST-  DR Irish Lack--  HX MI IN 2003 S/P  CABG X5 AND HX ATRIAL FLUTTER W/ ALBATION 2006 AND 2011  . Hematuria   . History of basal cell carcinoma (BCC) excision    left upper back, right chest area  . History of colon polyps   . History of MI (myocardial infarction) 08/04/2001   s/p  cabg  . Hydronephrosis, right   . Hyperlipidemia   . Hypertension   . Incomplete right bundle branch block (RBBB)   . 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 Right ureteral stone   . S/P ablation of atrial flutter    followed by dr Rayann Heman (EP cardiolgoist)  ablation 04-17-2004  and by dr allred 02-10- 2011 was successful  . S/P CABG x 5 08-05-2001   in [place] , [place]   LIMA to LAD,  seqVG to Diagonal,  SeqVG to OM,  seqVG to PDA and PLA  . Seasonal allergies   . Wears glasses     Past Surgical History:  Procedure Laterality Date  . CARDIAC CATHETERIZATION  08-04-2001  in [place], [place]   severe 3 vessel cad  . CARDIAC ELECTROPHYSIOLOGY MAPPING AND ABLATION  x2  04-17-2004 [place], [place]);  04-14-2009 by dr allred   per dr allred report-- successful ablation clockwise isthmus-dependent right atrial flutter along the usual cavotricupid isthmus;  complete bidirectionl isthmus block achieved  . CORONARY ARTERY BYPASS GRAFT  08-06-2003  in [place], [place]   x5-- LIMA - LAD,  seqVG to Diagonal, OM, PDA, and PLA  . FIXATION RIGHT WRIST Right 2011   stability for arthritis  . INGUINAL  HERNIA REPAIR Right 08-08-2015   [facility]- health care at [place]  . ROTATOR CUFF REPAIR Right 2004;  2007    Family History  Problem Relation Age of Onset  . Heart attack Mother   . Cancer Father        throat cancer, chewing tobacco  . Hypertension Neg Hx    Social History:  reports that he has never smoked. His smokeless tobacco use includes Snuff. He reports that he drinks alcohol. He reports that he does not use drugs.  Allergies: No Known Allergies  No prescriptions prior to admission.    No results found for this or any previous visit (from the past 48 hour(s)). No results found.  Review of Systems  Constitutional: Negative for chills and fever.  HENT: Negative.   Eyes: Negative.   Respiratory: Negative.   Cardiovascular: Negative.   Gastrointestinal: Negative.   Genitourinary: Positive for flank pain.  Skin: Negative.   Neurological: Negative.   Endo/Heme/Allergies: Negative.   Psychiatric/Behavioral: Negative.     Height 5\' 5"  (1.651 m), weight 78 kg (172 lb). Physical Exam  Constitutional: He appears well-developed.  HENT:  Head: Normocephalic.  Eyes: Pupils are equal, round, and reactive to light.  Neck: Normal range of motion.  Cardiovascular: Normal rate.   Respiratory: Effort normal.  GI: Soft.  Genitourinary:  Genitourinary Comments: Minimal Rt CVAT at present  Musculoskeletal: Normal range of motion.  Neurological: He is alert.  Skin: Skin is warm.  Psychiatric: He has a normal mood and affect.     Assessment/Plan  Proceed as planned with RIGHT ureteroscopic stone manipulation. Risks, benefits, alternatives, expected peri-op course discussed previously and reiterated today.   Alexis Frock, MD 08/10/2016, 6:33 AM

## 2016-08-10 NOTE — Interval H&P Note (Signed)
History and Physical Interval Note:  08/10/2016 11:24 AM  Consuello Bossier  has presented today for surgery, with the diagnosis of RIGHT URETERAL STONE  The various methods of treatment have been discussed with the patient and family. After consideration of risks, benefits and other options for treatment, the patient has consented to  Procedure(s): CYSTOSCOPY WITH RETROGRADE PYELOGRAM, URETEROSCOPY AND STENT PLACEMENT (Right) HOLMIUM LASER APPLICATION (Right) as a surgical intervention .  The patient's history has been reviewed, patient examined, no change in status, stable for surgery.  I have reviewed the patient's chart and labs.  Questions were answered to the patient's satisfaction.     Donald [name]

## 2016-08-10 NOTE — Anesthesia Preprocedure Evaluation (Addendum)
Anesthesia Evaluation  Patient identified by MRN, date of birth, ID band Patient awake    Reviewed: Allergy & Precautions, NPO status , Unable to perform ROS - Chart review only  Airway Mallampati: II  TM Distance: >3 FB Neck ROM: Full    Dental  (+) Teeth Intact, Dental Advisory Given   Pulmonary    breath sounds clear to auscultation       Cardiovascular hypertension,  Rhythm:Regular Rate:Normal     Neuro/Psych    GI/Hepatic   Endo/Other    Renal/GU      Musculoskeletal   Abdominal   Peds  Hematology   Anesthesia Other Findings   Reproductive/Obstetrics                            Anesthesia Physical Anesthesia Plan  ASA: III  Anesthesia Plan: General   Post-op Pain Management:    Induction: Intravenous  PONV Risk Score and Plan: Ondansetron  Airway Management Planned: LMA  Additional Equipment:   Intra-op Plan:   Post-operative Plan:   Informed Consent: I have reviewed the patients History and Physical, chart, labs and discussed the procedure including the risks, benefits and alternatives for the proposed anesthesia with the patient or authorized representative who has indicated his/her understanding and acceptance.   Dental advisory given  Plan Discussed with: CRNA and Anesthesiologist  Anesthesia Plan Comments:         Anesthesia Quick Evaluation

## 2016-08-13 ENCOUNTER — Encounter (HOSPITAL_BASED_OUTPATIENT_CLINIC_OR_DEPARTMENT_OTHER): Payer: Self-pay | Admitting: Urology

## 2016-08-28 ENCOUNTER — Other Ambulatory Visit: Payer: Self-pay | Admitting: Urology

## 2016-08-28 DIAGNOSIS — D49512 Neoplasm of unspecified behavior of left kidney: Secondary | ICD-10-CM | POA: Diagnosis not present

## 2016-08-28 DIAGNOSIS — N281 Cyst of kidney, acquired: Secondary | ICD-10-CM | POA: Diagnosis not present

## 2016-08-28 DIAGNOSIS — N201 Calculus of ureter: Secondary | ICD-10-CM | POA: Diagnosis not present

## 2016-08-28 DIAGNOSIS — K4090 Unilateral inguinal hernia, without obstruction or gangrene, not specified as recurrent: Secondary | ICD-10-CM | POA: Diagnosis not present

## 2016-10-01 ENCOUNTER — Ambulatory Visit (HOSPITAL_COMMUNITY)
Admission: RE | Admit: 2016-10-01 | Discharge: 2016-10-01 | Disposition: A | Discharge status: Home / Self Care | Payer: PPO | Source: Ambulatory Visit | Attending: Urology | Admitting: Urology

## 2016-10-01 DIAGNOSIS — N281 Cyst of kidney, acquired: Secondary | ICD-10-CM | POA: Diagnosis not present

## 2016-10-01 DIAGNOSIS — D49512 Neoplasm of unspecified behavior of left kidney: Secondary | ICD-10-CM | POA: Insufficient documentation

## 2016-10-01 DIAGNOSIS — K8020 Calculus of gallbladder without cholecystitis without obstruction: Secondary | ICD-10-CM | POA: Diagnosis not present

## 2016-10-01 DIAGNOSIS — N2889 Other specified disorders of kidney and ureter: Secondary | ICD-10-CM | POA: Insufficient documentation

## 2016-10-01 LAB — POCT I-STAT CREATININE: Creatinine, Ser: 0.9 mg/dL (ref 0.61–1.24)

## 2016-10-01 MED ORDER — GADOBENATE DIMEGLUMINE 529 MG/ML IV SOLN
15.0000 mL | Freq: Once | INTRAVENOUS | Status: AC | PRN
  Administered 2016-10-01: 16 mL via INTRAVENOUS

## 2016-10-15 DIAGNOSIS — R3911 Hesitancy of micturition: Secondary | ICD-10-CM | POA: Diagnosis not present

## 2016-10-15 DIAGNOSIS — C642 Malignant neoplasm of left kidney, except renal pelvis: Secondary | ICD-10-CM | POA: Diagnosis not present

## 2016-10-15 DIAGNOSIS — N201 Calculus of ureter: Secondary | ICD-10-CM | POA: Diagnosis not present

## 2016-10-15 DIAGNOSIS — N281 Cyst of kidney, acquired: Secondary | ICD-10-CM | POA: Diagnosis not present

## 2016-10-30 DIAGNOSIS — C44612 Basal cell carcinoma of skin of right upper limb, including shoulder: Secondary | ICD-10-CM | POA: Diagnosis not present

## 2016-10-30 DIAGNOSIS — D485 Neoplasm of uncertain behavior of skin: Secondary | ICD-10-CM | POA: Diagnosis not present

## 2016-10-30 DIAGNOSIS — L570 Actinic keratosis: Secondary | ICD-10-CM | POA: Diagnosis not present

## 2016-10-30 DIAGNOSIS — L812 Freckles: Secondary | ICD-10-CM | POA: Diagnosis not present

## 2016-10-30 DIAGNOSIS — C44619 Basal cell carcinoma of skin of left upper limb, including shoulder: Secondary | ICD-10-CM | POA: Diagnosis not present

## 2016-10-30 DIAGNOSIS — L821 Other seborrheic keratosis: Secondary | ICD-10-CM | POA: Diagnosis not present

## 2016-10-30 DIAGNOSIS — L309 Dermatitis, unspecified: Secondary | ICD-10-CM | POA: Diagnosis not present

## 2016-10-30 DIAGNOSIS — Z85828 Personal history of other malignant neoplasm of skin: Secondary | ICD-10-CM | POA: Diagnosis not present

## 2016-10-30 DIAGNOSIS — C44519 Basal cell carcinoma of skin of other part of trunk: Secondary | ICD-10-CM | POA: Diagnosis not present

## 2016-12-06 DIAGNOSIS — H524 Presbyopia: Secondary | ICD-10-CM | POA: Diagnosis not present

## 2016-12-06 DIAGNOSIS — H2513 Age-related nuclear cataract, bilateral: Secondary | ICD-10-CM | POA: Diagnosis not present

## 2016-12-27 ENCOUNTER — Ambulatory Visit (INDEPENDENT_AMBULATORY_CARE_PROVIDER_SITE_OTHER): Payer: PPO | Admitting: *Deleted

## 2016-12-27 DIAGNOSIS — Z23 Encounter for immunization: Secondary | ICD-10-CM

## 2017-01-04 ENCOUNTER — Encounter: Payer: Self-pay | Admitting: Interventional Cardiology

## 2017-01-17 ENCOUNTER — Ambulatory Visit: Payer: PPO | Admitting: Interventional Cardiology

## 2017-05-13 DIAGNOSIS — M179 Osteoarthritis of knee, unspecified: Secondary | ICD-10-CM

## 2017-05-13 DIAGNOSIS — M1710 Unilateral primary osteoarthritis, unspecified knee: Secondary | ICD-10-CM

## 2017-05-13 DIAGNOSIS — M25562 Pain in left knee: Secondary | ICD-10-CM | POA: Diagnosis not present

## 2017-05-13 DIAGNOSIS — M1712 Unilateral primary osteoarthritis, left knee: Secondary | ICD-10-CM | POA: Diagnosis not present

## 2017-05-13 HISTORY — DX: Osteoarthritis of knee, unspecified: M17.9

## 2017-05-13 HISTORY — DX: Unilateral primary osteoarthritis, unspecified knee: M17.10

## 2017-05-16 DIAGNOSIS — L570 Actinic keratosis: Secondary | ICD-10-CM | POA: Diagnosis not present

## 2017-05-16 DIAGNOSIS — Z85828 Personal history of other malignant neoplasm of skin: Secondary | ICD-10-CM | POA: Diagnosis not present

## 2017-05-16 DIAGNOSIS — D1801 Hemangioma of skin and subcutaneous tissue: Secondary | ICD-10-CM | POA: Diagnosis not present

## 2017-05-16 DIAGNOSIS — L821 Other seborrheic keratosis: Secondary | ICD-10-CM | POA: Diagnosis not present

## 2017-05-25 DIAGNOSIS — J019 Acute sinusitis, unspecified: Secondary | ICD-10-CM | POA: Diagnosis not present

## 2017-05-25 DIAGNOSIS — J22 Unspecified acute lower respiratory infection: Secondary | ICD-10-CM | POA: Diagnosis not present

## 2017-05-25 DIAGNOSIS — J3089 Other allergic rhinitis: Secondary | ICD-10-CM | POA: Diagnosis not present

## 2017-05-25 DIAGNOSIS — B9689 Other specified bacterial agents as the cause of diseases classified elsewhere: Secondary | ICD-10-CM | POA: Diagnosis not present

## 2017-07-22 ENCOUNTER — Other Ambulatory Visit: Payer: Self-pay | Admitting: Interventional Cardiology

## 2017-09-18 DIAGNOSIS — M1711 Unilateral primary osteoarthritis, right knee: Secondary | ICD-10-CM | POA: Diagnosis not present

## 2017-09-18 DIAGNOSIS — M1712 Unilateral primary osteoarthritis, left knee: Secondary | ICD-10-CM | POA: Diagnosis not present

## 2017-09-18 DIAGNOSIS — M25562 Pain in left knee: Secondary | ICD-10-CM | POA: Diagnosis not present

## 2017-10-14 DIAGNOSIS — C642 Malignant neoplasm of left kidney, except renal pelvis: Secondary | ICD-10-CM | POA: Diagnosis not present

## 2017-10-14 DIAGNOSIS — N281 Cyst of kidney, acquired: Secondary | ICD-10-CM | POA: Diagnosis not present

## 2017-10-14 DIAGNOSIS — R3911 Hesitancy of micturition: Secondary | ICD-10-CM | POA: Diagnosis not present

## 2017-10-14 DIAGNOSIS — N201 Calculus of ureter: Secondary | ICD-10-CM | POA: Diagnosis not present

## 2017-10-23 NOTE — Progress Notes (Signed)
Cardiology Office Note   Date:  10/24/2017   ID:  Donald [name], DOB 11/17/1945, MRN [id number]  PCP:  Lucretia Kern, DO    No chief complaint on file.  CAD  Wt Readings from Last 3 Encounters:  10/24/17 170 lb (77.1 kg)  08/10/16 169 lb (76.7 kg)  07/17/16 174 lb 3.2 oz (79 kg)       History of Present Illness: Donald [name] is a 72 y.o. male  who has CAD, CABG in 2001and a h/o AFlutter. He had an ablation for atrial flutter in 2011, and several years before that in [place].  He is off of coumadin.   He exercises 3x/week, mostly weights.  Knee pain has limited his exercise recently.  He has not been doing much cardio.    Denies : Chest pain. Dizziness. Leg edema. Nitroglycerin use. Orthopnea. Palpitations. Paroxysmal nocturnal dyspnea. Shortness of breath. Syncope.   He feels that he is more fatigued over the last few months.  He has not been doing much walking.  He had a problem with a kidney stone in 2018.  He has a f/u CT scan in the next few days, with Dr. Tresa Moore.  Past Medical History:  Diagnosis Date  . Allergic rhinitis   . BPH (benign prostatic hyperplasia)   . Bradycardia   . Coronary artery disease    CARDIOLOGIST-  DR Irish Lack--  HX MI IN 2003 S/P  CABG X5 AND HX ATRIAL FLUTTER W/ ALBATION 2006 AND 2011  . Hematuria   . History of basal cell carcinoma (BCC) excision    left upper back, right chest area  . History of colon polyps   . History of MI (myocardial infarction) 08/04/2001   s/p  cabg  . Hydronephrosis, right   . Hyperlipidemia   . Hypertension   . Incomplete right bundle branch block (RBBB)   . 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 Right ureteral stone   . S/P ablation of atrial flutter    followed by dr Rayann Heman (EP cardiolgoist)  ablation 04-17-2004  and by dr allred 02-10- 2011 was successful  . S/P CABG x 5 08-05-2001   in [place] , [place]   LIMA to LAD,  seqVG to Diagonal,  SeqVG to OM,  seqVG to PDA and PLA  . Seasonal allergies    . Wears glasses     Past Surgical History:  Procedure Laterality Date  . CARDIAC CATHETERIZATION  08-04-2001  in [place], [place]   severe 3 vessel cad  . CARDIAC ELECTROPHYSIOLOGY MAPPING AND ABLATION  x2  04-17-2004 [place], [place]);  04-14-2009 by dr allred   per dr allred report-- successful ablation clockwise isthmus-dependent right atrial flutter along the usual cavotricupid isthmus;  complete bidirectionl isthmus block achieved  . CORONARY ARTERY BYPASS GRAFT  08-06-2003  in [place], [place]   x5-- LIMA - LAD,  seqVG to Diagonal, OM, PDA, and PLA  . CYSTOSCOPY WITH RETROGRADE PYELOGRAM, URETEROSCOPY AND STENT PLACEMENT Right 08/10/2016   Procedure: CYSTOSCOPY WITH RETROGRADE PYELOGRAM, URETEROSCOPY AND STENT PLACEMENT;  Surgeon: Alexis Frock, MD;  Location: [facility];  Service: Urology;  Laterality: Right;  . FIXATION RIGHT WRIST Right 2011   stability for arthritis  . INGUINAL HERNIA REPAIR Right 08-08-2015   [facility] health care at [place]  . ROTATOR CUFF REPAIR Right 2004;  2007     Current Outpatient Medications  Medication Sig Dispense Refill  . aspirin 81  MG tablet Take 81 mg by mouth daily.    Marland Kitchen atorvastatin (LIPITOR) 80 MG tablet Take 40 mg by mouth every evening.     . metoprolol tartrate (LOPRESSOR) 25 MG tablet Take 0.5 tablets (12.5 mg total) by mouth daily. 45 tablet 3  . Multiple Vitamin (MULTIVITAMIN) tablet Take 1 tablet by mouth every morning.     . Omega-3 Fatty Acids (FISH OIL) 1000 MG CAPS Take 1 capsule by mouth every morning.     . Red Yeast Rice Extract (RED YEAST RICE PO) Take 1 capsule by mouth every morning.      No current facility-administered medications for this visit.     Allergies:   Patient has no known allergies.    Social History:  The patient  reports that he has never smoked. His smokeless tobacco use includes snuff. He reports that he drinks alcohol. He reports that he does not use drugs.   Family History:  The patient's  family history includes Cancer in his father; Heart attack in his mother.    ROS:  Please see the history of present illness.   Otherwise, review of systems are positive for fatigue.   All other systems are reviewed and negative.    PHYSICAL EXAM: VS:  BP 138/72   Pulse (!) 50   Ht 5\' 5"  (1.651 m)   Wt 170 lb (77.1 kg)   SpO2 98%   BMI 28.29 kg/m  , BMI Body mass index is 28.29 kg/m. GEN: Well nourished, well developed, in no acute distress  HEENT: normal  Neck: no JVD, carotid bruits, or masses Cardiac: bradycardia; 2/6 early systolic murmur, no rubs, or gallops,no edema  Respiratory:  clear to auscultation bilaterally, normal work of breathing GI: soft, nontender, nondistended, + BS MS: no deformity or atrophy  Skin: warm and dry, no rash Neuro:  Strength and sensation are intact Psych: euthymic mood, full affect   EKG:   The ekg ordered today demonstrates sinus bradycardia, RBBB   Recent Labs: No results found for requested labs within last 8760 hours.   Lipid Panel    Component Value Date/Time   HDL 49 05/20/2014   LDLCALC 59 05/20/2014     Other studies Reviewed: Additional studies/ records that were reviewed today with results demonstrating: labs reviewed , lipids in 4/19 well controlled.   ASSESSMENT AND PLAN:  1. CAD: No angina on medical therapy.  Continue aggressive secondary prevention.  Continue healthy diet and regular exercise. 2. Atrial flutter: s/p ablation. 3. DOE/fatigued: He has been out of his normal exercise routine.  He may be a little bit deconditioned.  I encouraged him to get back into a regular cardio routine.  We discussed checking an echocardiogram.  We will hold off at this point.  If his symptoms do not improve, would have a low threshold to order an echocardiogram.  His bradycardia is stable.  I reviewed his ECGs from the last 5 years and he has had heart rates in the high 40s consistently.  We have cut back on his metoprolol.  He has  not had any syncope.  We went over the symptoms of symptomatic bradycardia. 4. Hyperlipidemia: LDL 62 in April 2019.   Current medicines are reviewed at length with the patient today.  The patient concerns regarding his medicines were addressed.  The following changes have been made:  No change  Labs/ tests ordered today include:   Orders Placed This Encounter  Procedures  . EKG 12-Lead  Recommend 150 minutes/week of aerobic exercise Low fat, low carb, high fiber diet recommended  Disposition:   FU in 1 year   Signed, Larae Grooms, MD  10/24/2017 10:27 AM

## 2017-10-24 ENCOUNTER — Encounter (INDEPENDENT_AMBULATORY_CARE_PROVIDER_SITE_OTHER): Payer: Self-pay

## 2017-10-24 ENCOUNTER — Encounter: Payer: Self-pay | Admitting: Interventional Cardiology

## 2017-10-24 ENCOUNTER — Ambulatory Visit: Payer: PPO | Admitting: Interventional Cardiology

## 2017-10-24 VITALS — BP 138/72 | HR 50 | Ht 65.0 in | Wt 170.0 lb

## 2017-10-24 DIAGNOSIS — E782 Mixed hyperlipidemia: Secondary | ICD-10-CM

## 2017-10-24 DIAGNOSIS — I10 Essential (primary) hypertension: Secondary | ICD-10-CM

## 2017-10-24 DIAGNOSIS — I25119 Atherosclerotic heart disease of native coronary artery with unspecified angina pectoris: Secondary | ICD-10-CM

## 2017-10-24 DIAGNOSIS — I4892 Unspecified atrial flutter: Secondary | ICD-10-CM | POA: Diagnosis not present

## 2017-10-24 DIAGNOSIS — R0609 Other forms of dyspnea: Secondary | ICD-10-CM

## 2017-10-24 DIAGNOSIS — R0600 Dyspnea, unspecified: Secondary | ICD-10-CM

## 2017-10-24 MED ORDER — METOPROLOL TARTRATE 25 MG PO TABS: 12.5000 mg | ORAL_TABLET | Freq: Every day | ORAL | 3 refills | Status: DC

## 2017-10-24 NOTE — Patient Instructions (Addendum)

## 2017-10-28 DIAGNOSIS — C649 Malignant neoplasm of unspecified kidney, except renal pelvis: Secondary | ICD-10-CM | POA: Diagnosis not present

## 2017-10-28 DIAGNOSIS — C642 Malignant neoplasm of left kidney, except renal pelvis: Secondary | ICD-10-CM | POA: Diagnosis not present

## 2017-11-14 DIAGNOSIS — R3911 Hesitancy of micturition: Secondary | ICD-10-CM | POA: Diagnosis not present

## 2017-11-14 DIAGNOSIS — N201 Calculus of ureter: Secondary | ICD-10-CM | POA: Diagnosis not present

## 2017-11-14 DIAGNOSIS — N281 Cyst of kidney, acquired: Secondary | ICD-10-CM | POA: Diagnosis not present

## 2017-11-14 DIAGNOSIS — C642 Malignant neoplasm of left kidney, except renal pelvis: Secondary | ICD-10-CM | POA: Diagnosis not present

## 2017-12-02 DIAGNOSIS — L812 Freckles: Secondary | ICD-10-CM | POA: Diagnosis not present

## 2017-12-02 DIAGNOSIS — L821 Other seborrheic keratosis: Secondary | ICD-10-CM | POA: Diagnosis not present

## 2017-12-02 DIAGNOSIS — D485 Neoplasm of uncertain behavior of skin: Secondary | ICD-10-CM | POA: Diagnosis not present

## 2017-12-02 DIAGNOSIS — Z85828 Personal history of other malignant neoplasm of skin: Secondary | ICD-10-CM | POA: Diagnosis not present

## 2017-12-02 DIAGNOSIS — C44319 Basal cell carcinoma of skin of other parts of face: Secondary | ICD-10-CM | POA: Diagnosis not present

## 2017-12-02 DIAGNOSIS — D0439 Carcinoma in situ of skin of other parts of face: Secondary | ICD-10-CM | POA: Diagnosis not present

## 2017-12-02 DIAGNOSIS — D044 Carcinoma in situ of skin of scalp and neck: Secondary | ICD-10-CM | POA: Diagnosis not present

## 2018-01-16 DIAGNOSIS — C44319 Basal cell carcinoma of skin of other parts of face: Secondary | ICD-10-CM | POA: Diagnosis not present

## 2018-01-16 DIAGNOSIS — C4442 Squamous cell carcinoma of skin of scalp and neck: Secondary | ICD-10-CM | POA: Diagnosis not present

## 2018-01-16 DIAGNOSIS — Z85828 Personal history of other malignant neoplasm of skin: Secondary | ICD-10-CM | POA: Diagnosis not present

## 2018-01-23 DIAGNOSIS — Z4802 Encounter for removal of sutures: Secondary | ICD-10-CM | POA: Diagnosis not present

## 2018-05-05 ENCOUNTER — Telehealth: Payer: Self-pay | Admitting: Family Medicine

## 2018-05-05 NOTE — Telephone Encounter (Signed)
Copied from [facility] [id number]. Topic: Quick Communication - See Telephone Encounter >> May 05, 2018 10:56 AM [name], NT wrote: CRM for notification. See Telephone encounter for: 05/05/18. Pt requesting an order to have a cologuard test done. Please advise.

## 2018-05-05 NOTE — Telephone Encounter (Signed)
If due ok to order.

## 2018-05-07 NOTE — Telephone Encounter (Signed)
Order completed and faxed to [employer] at [phone].  I left a detailed message with this information at the pts home number and a reminder that he needs an appt as the last visit was in 2018.

## 2018-05-14 DIAGNOSIS — Z1212 Encounter for screening for malignant neoplasm of rectum: Secondary | ICD-10-CM | POA: Diagnosis not present

## 2018-05-14 DIAGNOSIS — Z1211 Encounter for screening for malignant neoplasm of colon: Secondary | ICD-10-CM | POA: Diagnosis not present

## 2018-05-18 LAB — COLOGUARD: Cologuard: NEGATIVE

## 2018-05-20 ENCOUNTER — Telehealth: Payer: Self-pay | Admitting: *Deleted

## 2018-05-20 ENCOUNTER — Encounter: Payer: Self-pay | Admitting: Family Medicine

## 2018-05-20 NOTE — Telephone Encounter (Signed)
[employer] [facility] faxed a report stating the Cologuard test was negative.  I called the pt and informed him of the results.

## 2018-05-22 DIAGNOSIS — Z85828 Personal history of other malignant neoplasm of skin: Secondary | ICD-10-CM | POA: Diagnosis not present

## 2018-05-22 DIAGNOSIS — C44619 Basal cell carcinoma of skin of left upper limb, including shoulder: Secondary | ICD-10-CM | POA: Diagnosis not present

## 2018-05-22 DIAGNOSIS — C44219 Basal cell carcinoma of skin of left ear and external auricular canal: Secondary | ICD-10-CM | POA: Diagnosis not present

## 2018-05-22 DIAGNOSIS — C44519 Basal cell carcinoma of skin of other part of trunk: Secondary | ICD-10-CM | POA: Diagnosis not present

## 2018-05-22 DIAGNOSIS — L812 Freckles: Secondary | ICD-10-CM | POA: Diagnosis not present

## 2018-05-22 DIAGNOSIS — L821 Other seborrheic keratosis: Secondary | ICD-10-CM | POA: Diagnosis not present

## 2018-05-22 DIAGNOSIS — C4441 Basal cell carcinoma of skin of scalp and neck: Secondary | ICD-10-CM | POA: Diagnosis not present

## 2018-05-22 DIAGNOSIS — L570 Actinic keratosis: Secondary | ICD-10-CM | POA: Diagnosis not present

## 2018-05-22 DIAGNOSIS — D485 Neoplasm of uncertain behavior of skin: Secondary | ICD-10-CM | POA: Diagnosis not present

## 2018-08-07 ENCOUNTER — Encounter: Payer: Self-pay | Admitting: Family Medicine

## 2018-08-07 ENCOUNTER — Other Ambulatory Visit: Payer: Self-pay

## 2018-08-07 ENCOUNTER — Ambulatory Visit (INDEPENDENT_AMBULATORY_CARE_PROVIDER_SITE_OTHER): Payer: PPO | Admitting: Family Medicine

## 2018-08-07 DIAGNOSIS — E782 Mixed hyperlipidemia: Secondary | ICD-10-CM | POA: Diagnosis not present

## 2018-08-07 DIAGNOSIS — F17220 Nicotine dependence, chewing tobacco, uncomplicated: Secondary | ICD-10-CM | POA: Diagnosis not present

## 2018-08-07 DIAGNOSIS — I2510 Atherosclerotic heart disease of native coronary artery without angina pectoris: Secondary | ICD-10-CM

## 2018-08-07 NOTE — Progress Notes (Signed)
Virtual Visit via Video Note  I connected with Donald [name] on 08/07/18 at 11:00 AM EDT by a video enabled telemedicine application and verified that I am speaking with the correct person using two identifiers.  Location [name]: home Location provider:work or home office Persons participating in the virtual visit: [name], provider  I discussed the limitations of evaluation and management by telemedicine and the availability of in person appointments. The [name] expressed understanding and agreed to proceed.   HPI: Pt is a 73 yo male with pmh sig for CAD, s/p CABG x5, h/o A flutter s/p ablation x 2, HTN, H/o basal cell carcinoma s/p excision.  Pt is following up on chronic conditions and TOC, previously seen by Dr. Maudie Mercury.  Pt states he is doing well overall.    CAD: h/o CABG x 5.  Also with h/o aflutter s/p ablation.  Pt exercising, less since the gym has been closed 2/2 COVID-19.  Still working part time.  Taking lipitor 40 mg and ASA 81 mg.  No longer taking lopressor 12.5 mg hypotension, was feeling dizzy and tired. Off med x 3-4 months. Was checking bp at home, was 122/70s.  Followed by Cards, Dr. Irish Lack  Social hx:  Pt is married to his wife, Donald [name] (also seen by this provider).  They have 2 children.  Pt served in the [employer] was stationed at [employer]  Pt is partially retired, states working at a part time job.  Chews tobacco x his whole life.  Pt has cut down, but is afraid to cut.  Goes through a couple of pouches per day.  States had a friend quit smoking then die a few yrs later.  Drinks an occasional beer.  Denies drug use.  ROS: See pertinent positives and negatives per HPI.  Past Medical History:  Diagnosis Date  . Allergic rhinitis   . BPH (benign prostatic hyperplasia)   . Bradycardia   . Coronary artery disease    CARDIOLOGIST-  DR Irish Lack--  HX MI IN 2003 S/P  CABG X5 AND HX ATRIAL FLUTTER W/ ALBATION 2006 AND 2011  . Hematuria   . History of basal cell  carcinoma (BCC) excision    left upper back, right chest area  . History of colon polyps   . History of MI (myocardial infarction) 08/04/2001   s/p  cabg  . Hydronephrosis, right   . Hyperlipidemia   . Hypertension   . Incomplete right bundle branch block (RBBB)   . 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 Right ureteral stone   . S/P ablation of atrial flutter    followed by dr Rayann Heman (EP cardiolgoist)  ablation 04-17-2004  and by dr allred 02-10- 2011 was successful  . S/P CABG x 5 08-05-2001   in [place] , [place]   LIMA to LAD,  seqVG to Diagonal,  SeqVG to OM,  seqVG to PDA and PLA  . Seasonal allergies   . Wears glasses     Past Surgical History:  Procedure Laterality Date  . CARDIAC CATHETERIZATION  08-04-2001  in [place], [place]   severe 3 vessel cad  . CARDIAC ELECTROPHYSIOLOGY MAPPING AND ABLATION  x2  04-17-2004 [place], [place]);  04-14-2009 by dr allred   per dr allred report-- successful ablation clockwise isthmus-dependent right atrial flutter along the usual cavotricupid isthmus;  complete bidirectionl isthmus block achieved  . CORONARY ARTERY BYPASS GRAFT  08-06-2003  in [place], [place]   x5-- LIMA - LAD,  seqVG to Diagonal, OM, PDA, and PLA  . CYSTOSCOPY WITH RETROGRADE PYELOGRAM, URETEROSCOPY AND STENT PLACEMENT Right 08/10/2016   Procedure: CYSTOSCOPY WITH RETROGRADE PYELOGRAM, URETEROSCOPY AND STENT PLACEMENT;  Surgeon: Alexis Frock, MD;  Location: [facility];  Service: Urology;  Laterality: Right;  . FIXATION RIGHT WRIST Right 2011   stability for arthritis  . INGUINAL HERNIA REPAIR Right 08-08-2015   [facility]- health care at [place]  . ROTATOR CUFF REPAIR Right 2004;  2007    Family History  Problem Relation Age of Onset  . Heart attack Mother   . Cancer Father        throat cancer, chewing tobacco  . Hypertension Neg Hx      Current Outpatient Medications:  .  aspirin 81 MG tablet, Take 81 mg by mouth daily., Disp: , Rfl:  .   atorvastatin (LIPITOR) 80 MG tablet, Take 40 mg by mouth every evening. , Disp: , Rfl:  .  metoprolol tartrate (LOPRESSOR) 25 MG tablet, Take 0.5 tablets (12.5 mg total) by mouth daily., Disp: 45 tablet, Rfl: 3 .  Multiple Vitamin (MULTIVITAMIN) tablet, Take 1 tablet by mouth every morning. , Disp: , Rfl:  .  Omega-3 Fatty Acids (FISH OIL) 1000 MG CAPS, Take 1 capsule by mouth every morning. , Disp: , Rfl:  .  Red Yeast Rice Extract (RED YEAST RICE PO), Take 1 capsule by mouth every morning. , Disp: , Rfl:   EXAM:  VITALS per [name] if applicable:  RR between 12-20 bpm  GENERAL: alert, oriented, appears well and in no acute distress  HEENT: atraumatic, conjunctiva clear, no obvious abnormalities on inspection of external nose and ears  NECK: normal movements of the head and neck  LUNGS: on inspection no signs of respiratory distress, breathing rate appears normal, no obvious gross SOB, gasping or wheezing  CV: no obvious cyanosis  MS: moves all visible extremities without noticeable abnormality  PSYCH/NEURO: pleasant and cooperative, no obvious depression or anxiety, speech and thought processing grossly intact  ASSESSMENT AND PLAN:  Discussed the following assessment and plan:  Coronary artery disease involving native coronary artery of native heart without angina pectoris -stable -continue current meds:  ASA 81 mg, Lipitor 40 mg -continue f/u with Dr. Beau Fanny  Mixed hyperlipidemia -continue Lipitor 40 mg  -continue lifestyle modifications  Chewing tobacco nicotine dependence without complication -cessation encouraged. -pt to consider cutting down. -will readdress at each visit.  F/u prn   I discussed the assessment and treatment plan with the [name]. The [name] was provided an opportunity to ask questions and all were answered. The [name] agreed with the plan and demonstrated an understanding of the instructions.   The [name] was advised to call back or seek an  in-person evaluation if the symptoms worsen or if the condition fails to improve as anticipated.   Billie Ruddy, MD

## 2018-08-14 DIAGNOSIS — Z85828 Personal history of other malignant neoplasm of skin: Secondary | ICD-10-CM | POA: Diagnosis not present

## 2018-08-14 DIAGNOSIS — C44219 Basal cell carcinoma of skin of left ear and external auricular canal: Secondary | ICD-10-CM | POA: Diagnosis not present

## 2018-09-02 ENCOUNTER — Other Ambulatory Visit: Payer: Self-pay

## 2018-09-02 ENCOUNTER — Ambulatory Visit (INDEPENDENT_AMBULATORY_CARE_PROVIDER_SITE_OTHER): Payer: PPO | Admitting: Family Medicine

## 2018-09-02 ENCOUNTER — Encounter: Payer: Self-pay | Admitting: Family Medicine

## 2018-09-02 DIAGNOSIS — L0100 Impetigo, unspecified: Secondary | ICD-10-CM

## 2018-09-02 MED ORDER — DOXYCYCLINE HYCLATE 100 MG PO TABS: 100.0000 mg | ORAL_TABLET | Freq: Two times a day (BID) | ORAL | 0 refills | Status: AC

## 2018-09-02 NOTE — Progress Notes (Signed)
Virtual Visit via Video Note  I connected with Donald [name] on 09/02/18 at  2:30 PM EDT by a video enabled telemedicine application and verified that I am speaking with the correct person using two identifiers.  Location patient: home Location provider:work or home office Persons participating in the virtual visit: patient, provider  I discussed the limitations of evaluation and management by telemedicine and the availability of in person appointments. The patient expressed understanding and agreed to proceed.   HPI: Pt states he was bit 3 days ago by a spider? on R medial calf.  Pt did not see or feel anything bite him.  Area was erythematous, then became swollen and slightly warm to the touch.  Pt tried hydrogen peroxide and neosporin.  Area is oozing a clear fluid and has a crust on it.  Pt's wife may have a smaller lesion that is similar in appearance.  Pt states he was around his grandkids last wk/wknd as his daughter got married.  Pt is unsure if the kids have similar lesions.  Pt denies fever, chills, n/v, HA.  Asked about medication allergies.  Pt states he was given a med for 5 days by his dermatologist that gave him diarrhea.  Unsure of med.  Thinks it was a 500 mg pill.    ROS: See pertinent positives and negatives per HPI.  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    Right ureteral stone    S/P ablation of  atrial flutter    followed by dr Rayann Heman (EP cardiolgoist)  ablation 04-17-2004  and by dr allred 02-10- 2011 was successful   S/P CABG x 5 08-05-2001   in [place] , [place]   LIMA to LAD,  seqVG to Diagonal,  SeqVG to OM,  seqVG to PDA and PLA   Seasonal allergies    Wears glasses     Past Surgical History:  Procedure Laterality Date   CARDIAC CATHETERIZATION  08-04-2001  in [place], [place]   severe 3 vessel cad   CARDIAC ELECTROPHYSIOLOGY [place]  x2  04-17-2004 [place], [place]);  04-14-2009 by dr Rayann Heman   per dr allred report-- successful ablation clockwise isthmus-dependent right atrial flutter along the usual cavotricupid isthmus;  complete bidirectionl isthmus block achieved   CORONARY ARTERY BYPASS GRAFT  08-06-2003  in [place], [place]   x5-- LIMA - LAD,  seqVG to Diagonal, OM, PDA, and PLA   CYSTOSCOPY WITH RETROGRADE PYELOGRAM, URETEROSCOPY AND STENT PLACEMENT Right 08/10/2016   Procedure: CYSTOSCOPY WITH RETROGRADE PYELOGRAM, URETEROSCOPY AND STENT PLACEMENT;  Surgeon: [facility]is Frock, MD;  Location: [facility];  Service: Urology;  Laterality: Right;   FIXATION RIGHT WRIST Right 2011   stability for arthritis   INGUINAL HERNIA REPAIR Right 08-08-2015   [facility]- health care at [place]  ROTATOR CUFF REPAIR Right 2004;  2007    Family History  Problem Relation Age of Onset   Heart attack Mother    Cancer Father        throat cancer, chewing tobacco   Hypertension Neg Hx       Current Outpatient Medications:    aspirin 81 MG tablet, Take 81 mg by mouth daily., Disp: , Rfl:    atorvastatin (LIPITOR) 80 MG tablet, Take 40 mg by mouth every evening. , Disp: , Rfl:    metoprolol tartrate (LOPRESSOR) 25 MG tablet, Take 0.5 tablets (12.5 mg total) by mouth daily., Disp: 45 tablet, Rfl: 3   Multiple Vitamin (MULTIVITAMIN) tablet, Take 1 tablet by mouth every morning. , Disp: , Rfl:    Omega-3 Fatty Acids (FISH OIL) 1000 MG CAPS, Take 1 capsule by  mouth every morning. , Disp: , Rfl:    Red Yeast Rice Extract (RED YEAST RICE PO), Take 1 capsule by mouth every morning. , Disp: , Rfl:   EXAM:   VITALS per patient if applicable:  RR between 12-20 bpm  GENERAL: alert, oriented, appears well and in no acute distress  HEENT: atraumatic, conjunctiva clear, no obvious abnormalities on inspection of external nose and ears  NECK: normal movements of the head and neck  LUNGS: on inspection no signs of respiratory distress, breathing rate appears normal, no obvious gross SOB, gasping or wheezing  CV: no obvious cyanosis  SKIN: R medial calf with honey crusted lesions and mild erythema. No edema or tracking.    MS: moves all visible extremities without noticeable abnormality  PSYCH/NEURO: pleasant and cooperative, no obvious depression or anxiety, speech and thought processing grossly intact  ASSESSMENT AND PLAN:  Discussed the following assessment and plan:  Impetigo  -discussed area less likely a spider bite, though could have been a bite that developed a secondary infection with staph or strep and spread to pt's wife. -educated about impetigo.   -as unclear if pt was bit by an insect, tick, or other pest, will start doxycycline to cover tick borne illness as well as impetigo or cellulitis -given precautions -advised to ask pharmacy or Derm office what medication gave him diarrhea so it can be added to allergy list. - Plan: doxycycline (VIBRA-TABS) 100 MG tablet  F/u prn   I discussed the assessment and treatment plan with the patient. The patient was provided an opportunity to ask questions and all were answered. The patient agreed with the plan and demonstrated an understanding of the instructions.   The patient was advised to call back or seek an in-person evaluation if the symptoms worsen or if the condition fails to improve as anticipated.   Billie Ruddy, MD

## 2018-10-02 ENCOUNTER — Other Ambulatory Visit: Payer: Self-pay

## 2018-10-24 LAB — HM COLONOSCOPY

## 2018-11-11 ENCOUNTER — Encounter: Payer: Self-pay | Admitting: Interventional Cardiology

## 2018-11-20 DIAGNOSIS — C642 Malignant neoplasm of left kidney, except renal pelvis: Secondary | ICD-10-CM | POA: Diagnosis not present

## 2018-11-21 ENCOUNTER — Telehealth: Payer: Self-pay | Admitting: Family Medicine

## 2018-11-21 NOTE — Telephone Encounter (Signed)
Dr. Volanda Napoleon - Please advise on pt's request to transfer care. Than you!

## 2018-11-21 NOTE — Telephone Encounter (Signed)
Pt is requesting to transfer FROM: Dr. Volanda Napoleon Pt is requesting to transfer TO: Dr Adelina Mings  Reason for requested transfer: due to location  Best number to contact pt [phone]

## 2018-11-24 NOTE — Telephone Encounter (Signed)
Ok by me. If approval received from Dr. Volanda Napoleon, then please schedule in new pt slot.

## 2018-11-25 ENCOUNTER — Encounter: Payer: Self-pay | Admitting: Physician Assistant

## 2018-11-25 NOTE — Progress Notes (Signed)
Cardiology Office Note    Date:  11/28/2018   ID:  Donald [name], DOB 08-12-1945, MRN [id number]  PCP:  Billie Ruddy, MD  Cardiologist:  Larae Grooms, MD  Electrophysiologist:  Thompson Grayer, MD   Chief Complaint: 1 year f/u CAD, atrial flutter, bradycardia  History of Present Illness:   Donald [name] is a 73 y.o. male originally from [place] with history of CAD s/p CABG in 2001, atrial flutter (ablation 2006, 2011), BPH, HTN, HLD, IRBBB who presents for annual follow-up. He has been off Coumadin due to lack of recurrence. His HR has been chronically in the high 40s, managed with watchful waiting. Last echo 2005 showed EF 50% with RWMA (diagram difficult to interpert), mild LAE. He brings in a copy of his labs from the [place] which we will scan, but briefly, LDL 64, A1C 6.1, Hgb 13.0, K 4.1,  Cr 0.875, LFTs wnl.  He returns for routine f/u overall feeling well. He has remained very active exercising 4 days a week without angina or dyspnea. In Feb/March of this year he had some wooziness so self-discontinued his metoprolol with resolution of symptoms. He's felt well since that time without complaint. No further dizziness. No pre-syncope or syncope. No palpitations. He still uses snuff so we discussed d/c of tobacco.  Past Medical History:  Diagnosis Date  . Allergic rhinitis   . BPH (benign prostatic hyperplasia)   . Coronary artery disease    a. MI 2003 with CABG.  . Hematuria   . History of basal cell carcinoma (BCC) excision    left upper back, right chest area  . History of colon polyps   . History of MI (myocardial infarction) 08/04/2001   s/p  cabg  . Hydronephrosis, right   . Hyperlipidemia   . Hypertension   . Incomplete right bundle branch block (RBBB)   . 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 Right ureteral stone   . S/P ablation of atrial flutter    followed by dr Rayann Heman (EP cardiolgoist)  ablation 04-17-2004  and by dr allred 02-10- 2011 was  successful  . S/P CABG x 5 08-05-2001   in [place] , [place]   LIMA to LAD,  seqVG to Diagonal,  SeqVG to OM,  seqVG to PDA and PLA  . Seasonal allergies   . Sinus bradycardia   . Wears glasses     Past Surgical History:  Procedure Laterality Date  . CARDIAC CATHETERIZATION  08-04-2001  in [place], [place]   severe 3 vessel cad  . CARDIAC ELECTROPHYSIOLOGY [place]BL[place]  x2  04-17-2004 [place], [place]);  04-14-2009 by dr allred   per dr allred report-- successful ablation clockwise isthmus-dependent right atrial flutter along the usual cavotricupid isthmus;  complete bidirectionl isthmus block achieved  . CORONARY ARTERY BYPASS GRAFT  08-06-2003  in [place], [place]   x5-- LIMA - LAD,  seqVG to Diagonal, OM, PDA, and PLA  . CYSTOSCOPY WITH RETROGRADE PYELOGRAM, URETEROSCOPY AND STENT PLACEMENT Right 08/10/2016   Procedure: CYSTOSCOPY WITH RETROGRADE PYELOGRAM, URETEROSCOPY AND STENT PLACEMENT;  Surgeon: [facility] Frock, MD;  Location: [facility];  Service: Urology;  Laterality: Right;  . FIXATION RIGHT WRIST Right 2011   stability for arthritis  . INGUINAL HERNIA REPAIR Right 08-08-2015   [facility]- health care at [place]  . ROTATOR CUFF REPAIR Right 2004;  2007    Current Medications: Current Meds  Medication Sig  . aspirin 81  MG tablet Take 81 mg by mouth daily.  Marland Kitchen atorvastatin (LIPITOR) 80 MG tablet Take 40 mg by mouth every evening.   . Multiple Vitamin (MULTIVITAMIN) tablet Take 1 tablet by mouth every morning.   . Omega-3 Fatty Acids (FISH OIL) 1000 MG CAPS Take 1 capsule by mouth every morning.   . Red Yeast Rice Extract (RED YEAST RICE PO) Take 1 capsule by mouth every morning.      Allergies:   Patient has no known allergies.   Social History   Socioeconomic History  . Marital status: Married    Spouse name: Not on file  . Number of children: Not on file  . Years of education: Not on file  . Highest education level: Not on file  Occupational History  . Not on  file  Social Needs  . Financial resource strain: Not on file  . Food insecurity    Worry: Not on file    Inability: Not on file  . Transportation needs    Medical: Not on file    Non-medical: Not on file  Tobacco Use  . Smoking status: Never Smoker  . Smokeless tobacco: Current User    Types: Snuff  . Tobacco comment: 08-02-2016 ~ dip tobacco for 30 yrs  Substance and Sexual Activity  . Alcohol use: Yes    Comment: occ.   . Drug use: No  . Sexual activity: Not on file  Lifestyle  . Physical activity    Days per week: Not on file    Minutes per session: Not on file  . Stress: Not on file  Relationships  . Social Herbalist on phone: Not on file    Gets together: Not on file    Attends religious service: Not on file    Active member of club or organization: Not on file    Attends meetings of clubs or organizations: Not on file    Relationship status: Not on file  Other Topics Concern  . 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 There is no history of Hypertension.  ROS:   Please see the history of present illness. All other systems are reviewed and otherwise negative.    EKGs/Labs/Other Studies Reviewed:    Studies reviewed were summarized above.   EKG:  EKG is ordered today, personally reviewed, demonstrating NSR 60bpm, iRBBB, no acute changes from prior  Recent Labs: No results found for requested labs within last 8760 hours.  Recent Lipid Panel    Component Value Date/Time   HDL 49 05/20/2014   LDLCALC 59 05/20/2014    PHYSICAL EXAM:    VS:  BP 118/70   Pulse 60   Ht 5\' 5"  (1.651 m)   Wt 164 lb 12.8 oz (74.8 kg)    SpO2 97%   BMI 27.42 kg/m   BMI: Body mass index is 27.42 kg/m.  GEN: Well nourished, well developed WM, in no acute distress HEENT: normocephalic, atraumatic Neck: no JVD, carotid bruits, or masses Cardiac: RRR; no murmurs, rubs,  or gallops, no edema  Respiratory:  clear to auscultation bilaterally, normal work of breathing GI: soft, nontender, nondistended, + BS MS: no deformity or atrophy Skin: warm and dry, no rash Neuro:  Alert and Oriented x 3, Strength and sensation are intact, follows commands Psych: euthymic mood, full affect  Wt Readings from Last 3 Encounters:  11/28/18 164 lb 12.8 oz (74.8 kg)  10/24/17 170 lb (77.1 kg)  08/10/16 169 lb (76.7 kg)     ASSESSMENT & PLAN:   1. CAD s/p CABG - clinically doing well without recurrent angina. Continue ASA, statin. He is not on BB for reasons above. 2. Atrial flutter - quiescent. No longer on anticoagulation given lack of clinical recurrence. Continue yearly surveillance. 3. Sinus bradycardia - metoprolol stopped in the spring due to wooziness. This has not recurred off BB. We reviewed symptoms of symptomatic bradycardia and he will notify of any recurrence. EKG shows iRBBB, similar to prior. HR is presently 60bpm. We discussed potential need for PPM in the future but he is not having any evidence of recurrent bradycardia off metoprolol.  4. Hyperlipidemia - well controlled by [facility] labs. Will scan. Continue present regimen.  Disposition: F/u with Dr. [facility] Lack in 1 year.  Medication Adjustments/Labs and Tests Ordered: Current medicines are reviewed at length with the patient today.  Concerns regarding medicines are outlined above. Medication changes, Labs and Tests ordered today are summarized above and listed in the Patient Instructions accessible in Encounters.   Signed, Charlie Pitter, PA-C  11/28/2018 10:23 AM    [facility] [address], [place], [place]  [address] Phone: [phone]; Fax: [phone]

## 2018-11-26 NOTE — Telephone Encounter (Signed)
ok 

## 2018-11-26 NOTE — Telephone Encounter (Signed)
Routed to [place] to schedule

## 2018-11-27 DIAGNOSIS — N2889 Other specified disorders of kidney and ureter: Secondary | ICD-10-CM | POA: Diagnosis not present

## 2018-11-27 DIAGNOSIS — N200 Calculus of kidney: Secondary | ICD-10-CM | POA: Diagnosis not present

## 2018-11-27 DIAGNOSIS — D3502 Benign neoplasm of left adrenal gland: Secondary | ICD-10-CM | POA: Diagnosis not present

## 2018-11-27 DIAGNOSIS — N281 Cyst of kidney, acquired: Secondary | ICD-10-CM | POA: Diagnosis not present

## 2018-11-27 DIAGNOSIS — C642 Malignant neoplasm of left kidney, except renal pelvis: Secondary | ICD-10-CM | POA: Diagnosis not present

## 2018-11-28 ENCOUNTER — Other Ambulatory Visit: Payer: Self-pay

## 2018-11-28 ENCOUNTER — Encounter (INDEPENDENT_AMBULATORY_CARE_PROVIDER_SITE_OTHER): Payer: Self-pay

## 2018-11-28 ENCOUNTER — Ambulatory Visit: Payer: PPO | Admitting: Physician Assistant

## 2018-11-28 ENCOUNTER — Encounter: Payer: Self-pay | Admitting: Physician Assistant

## 2018-11-28 VITALS — BP 118/70 | HR 60 | Ht 65.0 in | Wt 164.8 lb

## 2018-11-28 DIAGNOSIS — I2510 Atherosclerotic heart disease of native coronary artery without angina pectoris: Secondary | ICD-10-CM | POA: Diagnosis not present

## 2018-11-28 DIAGNOSIS — E785 Hyperlipidemia, unspecified: Secondary | ICD-10-CM

## 2018-11-28 DIAGNOSIS — Z951 Presence of aortocoronary bypass graft: Secondary | ICD-10-CM

## 2018-11-28 DIAGNOSIS — I4892 Unspecified atrial flutter: Secondary | ICD-10-CM | POA: Diagnosis not present

## 2018-11-28 DIAGNOSIS — R001 Bradycardia, unspecified: Secondary | ICD-10-CM

## 2018-11-28 LAB — T4, FREE: Free T4: 1.02 ng/dL (ref 0.82–1.77)

## 2018-11-28 LAB — TSH: TSH: 1.33 u[IU]/mL (ref 0.450–4.500)

## 2018-11-28 NOTE — Patient Instructions (Signed)
Medication Instructions:  Your physician recommends that you continue on your current medications as directed. Please refer to the Current Medication list given to you today.  If you need a refill on your cardiac medications before your next appointment, please call your pharmacy.   Lab work: TODAY:  TSH & FREE T4  If you have labs (blood work) drawn today and your tests are completely normal, you will receive your results only by: Marland Kitchen MyChart Message (if you have MyChart) OR . A paper copy in the mail If you have any lab test that is abnormal or we need to change your treatment, we will call you to review the results.  Testing/Procedures: None ordered  Follow-Up: At [facility], you and your health needs are our priority.  As part of our continuing mission to provide you with exceptional heart care, we have created designated Provider Care Teams.  These Care Teams include your primary Cardiologist (physician) and Advanced Practice Providers (APPs -  Physician Assistants and Nurse Practitioners) who all work together to provide you with the care you need, when you need it. You will need a follow up appointment in 12 months.  Please call our office 2 months in advance to schedule this appointment.  You may see Larae Grooms, MD or one of the following Advanced Practice Providers on your designated Care Team:   [place], PA-C Melina Copa, PA-C . Ermalinda Barrios, PA-C  Any Other Special Instructions Will Be Listed Below (If Applicable).

## 2018-12-04 DIAGNOSIS — N281 Cyst of kidney, acquired: Secondary | ICD-10-CM | POA: Diagnosis not present

## 2018-12-04 DIAGNOSIS — C642 Malignant neoplasm of left kidney, except renal pelvis: Secondary | ICD-10-CM | POA: Diagnosis not present

## 2018-12-04 DIAGNOSIS — N201 Calculus of ureter: Secondary | ICD-10-CM | POA: Diagnosis not present

## 2018-12-09 NOTE — Telephone Encounter (Signed)
Scheduled TOC appt with Dr. Raoul Pitch on 10/23

## 2018-12-10 ENCOUNTER — Other Ambulatory Visit: Payer: Self-pay | Admitting: Urology

## 2018-12-11 DIAGNOSIS — M1712 Unilateral primary osteoarthritis, left knee: Secondary | ICD-10-CM | POA: Diagnosis not present

## 2018-12-11 DIAGNOSIS — M25562 Pain in left knee: Secondary | ICD-10-CM | POA: Diagnosis not present

## 2018-12-19 ENCOUNTER — Other Ambulatory Visit: Payer: Self-pay

## 2018-12-19 ENCOUNTER — Ambulatory Visit (INDEPENDENT_AMBULATORY_CARE_PROVIDER_SITE_OTHER): Payer: PPO

## 2018-12-19 DIAGNOSIS — Z23 Encounter for immunization: Secondary | ICD-10-CM

## 2018-12-26 ENCOUNTER — Encounter: Payer: PPO | Admitting: Family Medicine

## 2018-12-30 ENCOUNTER — Encounter: Payer: PPO | Admitting: Family Medicine

## 2019-01-12 ENCOUNTER — Encounter: Payer: Self-pay | Admitting: Family Medicine

## 2019-01-12 ENCOUNTER — Ambulatory Visit (INDEPENDENT_AMBULATORY_CARE_PROVIDER_SITE_OTHER): Payer: PPO | Admitting: Family Medicine

## 2019-01-12 ENCOUNTER — Other Ambulatory Visit: Payer: Self-pay

## 2019-01-12 VITALS — BP 107/68 | HR 69 | Temp 97.3°F | Resp 16 | Ht 64.75 in | Wt 164.0 lb

## 2019-01-12 DIAGNOSIS — E785 Hyperlipidemia, unspecified: Secondary | ICD-10-CM

## 2019-01-12 DIAGNOSIS — D4112 Neoplasm of uncertain behavior of left renal pelvis: Secondary | ICD-10-CM

## 2019-01-12 DIAGNOSIS — I4892 Unspecified atrial flutter: Secondary | ICD-10-CM | POA: Diagnosis not present

## 2019-01-12 DIAGNOSIS — I2510 Atherosclerotic heart disease of native coronary artery without angina pectoris: Secondary | ICD-10-CM | POA: Insufficient documentation

## 2019-01-12 DIAGNOSIS — Z85528 Personal history of other malignant neoplasm of kidney: Secondary | ICD-10-CM | POA: Insufficient documentation

## 2019-01-12 DIAGNOSIS — I252 Old myocardial infarction: Secondary | ICD-10-CM

## 2019-01-12 DIAGNOSIS — E663 Overweight: Secondary | ICD-10-CM

## 2019-01-12 DIAGNOSIS — C649 Malignant neoplasm of unspecified kidney, except renal pelvis: Secondary | ICD-10-CM | POA: Insufficient documentation

## 2019-01-12 NOTE — Patient Instructions (Signed)
Nice to meet you today.  Please bring in any recent labs or records from [place]. The next time you are seen there please let them know we are the primary team and would like records of your visits/labs. Thanks.    Please help [place] help you:  We are honored you have chosen [facility] for your Primary Care home. Below you will find basic instructions that you may need to access in the future. Please help [place] help you by reading the instructions, which cover many of the frequent questions we experience.   Prescription refills and request:  -In order to allow more efficient response time, please call your pharmacy for all refills. They will forward the request electronically to [place]. This allows for the quickest possible response. Request left on a nurse line can take longer to refill, since these are checked as time allows between office patients and other phone calls.  - refill request can take up to 3-5 working days to complete.  - If request is sent electronically and request is appropiate, it is usually completed in 1-2 business days.  - all patients will need to be seen routinely for all chronic medical conditions requiring prescription medications (see follow-up below). If you are overdue for follow up on your condition, you will be asked to make an appointment and we will call in enough medication to cover you until your appointment (up to 30 days).  - all controlled substances will require a face to face visit to request/refill.  - if you desire your prescriptions to go through a new pharmacy, and have an active script at original pharmacy, you will need to call your pharmacy and have scripts transferred to new pharmacy. This is completed between the pharmacy locations and not by your provider.    Results: If any images or labs were ordered, it can take up to 1 week to get results depending on the test ordered and the lab/facility running and resulting the test. - Normal or stable results, which  do not need further discussion, may be released to your mychart immediately with attached note to you. A call may not be generated for normal results. Please make certain to sign up for mychart. If you have questions on how to activate your mychart you can call the front office.  - If your results need further discussion, our office will attempt to contact you via phone, and if unable to reach you after 2 attempts, we will release your abnormal result to your mychart with instructions.  - All results will be automatically released in mychart after 1 week.  - Your provider will provide you with explanation and instruction on all relevant material in your results. Please keep in mind, results and labs may appear confusing or abnormal to the untrained eye, but it does not mean they are actually abnormal for you personally. If you have any questions about your results that are not covered, or you desire more detailed explanation than what was provided, you should make an appointment with your provider to do so.   Our office handles many outgoing and incoming calls daily. If we have not contacted you within 1 week about your results, please check your mychart to see if there is a message first and if not, then contact our office.  In helping with this matter, you help decrease call volume, and therefore allow [place] to be able to respond to patients needs more efficiently.   Acute office visits (sick  visit):  An acute visit is intended for a new problem and are scheduled in shorter time slots to allow schedule openings for patients with new problems. This is the appropriate visit to discuss a new problem. Problems will not be addressed by phone call or Echart message. Appointment is needed if requesting treatment. In order to provide you with excellent quality medical care with proper time for you to explain your problem, have an exam and receive treatment with instructions, these appointments should be limited to  one new problem per visit. If you experience a new problem, in which you desire to be addressed, please make an acute office visit, we save openings on the schedule to accommodate you. Please do not save your new problem for any other type of visit, let us take care of it properly and quickly for you.   Follow up visits:  Depending on your condition(s) your provider will need to see you routinely in order to provide you with quality care and prescribe medication(s). Most chronic conditions (Example: hypertension, Diabetes, depression/anxiety... etc), require visits a couple times a year. Your provider will instruct you on proper follow up for your personal medical conditions and history. Please make certain to make follow up appointments for your condition as instructed. Failing to do so could result in lapse in your medication treatment/refills. If you request a refill, and are overdue to be seen on a condition, we will always provide you with a 30 day script (once) to allow you time to schedule.    Medicare wellness (well visit): - we have a wonderful Nurse Maudie Mercury), that will meet with you and provide you will yearly medicare wellness visits. These visits should occur yearly (can not be scheduled less than 1 calendar year apart) and cover preventive health, immunizations, advance directives and screenings you are entitled to yearly through your medicare benefits. Do not miss out on your entitled benefits, this is when medicare will pay for these benefits to be ordered for you.  These are strongly encouraged by your provider and is the appropriate type of visit to make certain you are up to date with all preventive health benefits. If you have not had your medicare wellness exam in the last 12 months, please make certain to schedule one by calling the office and schedule your medicare wellness with Maudie Mercury as soon as possible.   Yearly physical (well visit):  - Adults are recommended to be seen yearly for  physicals. Check with your insurance and date of your last physical, most insurances require one calendar year between physicals. Physicals include all preventive health topics, screenings, medical exam and labs that are appropriate for gender/age and history. You may have fasting labs needed at this visit. This is a well visit (not a sick visit), new problems should not be covered during this visit (see acute visit).  - Pediatric patients are seen more frequently when they are younger. Your provider will advise you on well child visit timing that is appropriate for your their age. - This is not a medicare wellness visit. Medicare wellness exams do not have an exam portion to the visit. Some medicare companies allow for a physical, some do not allow a yearly physical. If your medicare allows a yearly physical you can schedule the medicare wellness with our nurse Maudie Mercury and have your physical with your provider after, on the same day. Please check with insurance for your full benefits.   Late Policy/No Shows:  - all new patients  should arrive 15-30 minutes earlier than appointment to allow [place] time  to  obtain all personal demographics,  insurance information and for you to complete office paperwork. - All established patients should arrive 10-15 minutes earlier than appointment time to update all information and be checked in .  - In our best efforts to run on time, if you are late for your appointment you will be asked to either reschedule or if able, we will work you back into the schedule. There will be a wait time to work you back in the schedule,  depending on availability.  - If you are unable to make it to your appointment as scheduled, please call 24 hours ahead of time to allow [place] to fill the time slot with someone else who needs to be seen. If you do not cancel your appointment ahead of time, you may be charged a no show fee.

## 2019-01-12 NOTE — Progress Notes (Signed)
Patient ID: Donald [name], male  DOB: 08-13-1945, 73 y.o.   MRN: [id number] Patient Care Team    Relationship Specialty Notifications Start End  Ma Hillock, DO PCP - General Family Medicine  12/11/18   Jettie Booze, MD PCP - Cardiology Cardiology Admissions 11/25/18   Thompson Grayer, MD PCP - Electrophysiology Cardiology Admissions 11/25/18   Ulyses Southward., MD Consulting Physician Urology  09/09/14   Jettie Booze, MD Consulting Physician Cardiology  09/09/14   Alexis Frock, MD Consulting Physician Urology  01/12/19   Jarome Matin, MD Consulting Physician Dermatology  01/12/19     Chief Complaint  Patient presents with  . Establish Care    Pt is establishing care today. Does not need refills. Gets Atorvastatin from the [place]. Pt goes to have growth removed on kidney      Subjective:  NYZIR OKI is a 73 y.o.  male present for [facility]- new pt to this provider. All past medical history, surgical history, allergies, family history, immunizations, medications and social history were updated in the electronic medical record today. All recent labs, ED visits and hospitalizations within the last year were reviewed.  Atrial flutter, unspecified type (HCC)/ Hyperlipidemia, unspecified hyperlipidemia type/CAD Patient follows with Dr. Irish Lack. He has a history of "CAD s/p CABG in 2001, atrial flutter (ablation 2006, 2011), BPH, HTN, HLD, IRBBB." he has been on coumadin and BB but d/t symptomatic bradycardia and lack of recurrence. Last echo 2005 showed EF 50% with RWMA (diagram difficult to interpert), mild LAE. His labs are completed by the [place] with his CPE. He  Reports compliance with red yeast rice, statin, fish oil and baby ASA.    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 Tresa Moore. He reports they have been watching a lesion on his kidney for a few years. The last image showed  growth and it was decided to move forward with resection.    Depression screen [facility] 2/9 01/12/2019 09/09/2014  Decreased Interest 0 0  Down, Depressed, Hopeless 0 0  PHQ - 2 Score 0 0   No flowsheet data found.   Fall Risk  09/09/2014  Falls in the past year? No    Immunization History  Administered Date(s) Administered  . Fluad Quad(high Dose 65+) 12/19/2018  . Influenza, High Dose Seasonal PF 12/27/2016  . Influenza,inj,Quad PF,6+ Mos 01/12/2014  . Influenza,inj,quad, With Preservative 12/03/2016, 12/17/2017  . Influenza-Unspecified 12/19/2014  . Pneumococcal Conjugate-13 09/24/2013  . Pneumococcal-Unspecified 12/03/2016    No exam data present  Past Medical History:  Diagnosis Date  . Allergic rhinitis   . BPH (benign prostatic hyperplasia)   . Chicken pox   . Coronary artery disease    a. MI 2003 with CABG.  . Hematuria   . History of basal cell carcinoma (BCC) excision    left upper back, right chest area  . History of colon polyps   . History of MI (myocardial infarction) 08/04/2001   s/p  cabg  . Hydronephrosis, right   . Hyperlipidemia   . Hypertension   . HYPERTENSION, MILD 05/18/2009   Qualifier: Diagnosis of  By: Rayann Heman, MD, Jeneen Rinks    . Incomplete right bundle branch block (RBBB)   . 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 Osteoarthritis of knee 05/13/2017  . Right ureteral stone   . S/P ablation of atrial flutter  followed by dr Rayann Heman (EP cardiolgoist)  ablation 04-17-2004  and by dr allred 02-10- 2011 was successful  . S/P CABG x 5 08-05-2001   in [place] , [place]   LIMA to LAD,  seqVG to Diagonal,  SeqVG to OM,  seqVG to PDA and PLA  . Seasonal allergies   . Sinus bradycardia   . Wears glasses    No Known Allergies Past Surgical History:  Procedure Laterality Date  . CARDIAC CATHETERIZATION  08-04-2001  in [place], [place]   severe 3 vessel cad  . CARDIAC ELECTROPHYSIOLOGY MAPPING AND ABLATION  x2  04-17-2004 [place], [place]);  04-14-2009 by dr allred    per dr allred report-- successful ablation clockwise isthmus-dependent right atrial flutter along the usual cavotricupid isthmus;  complete bidirectionl isthmus block achieved  . CORONARY ARTERY BYPASS GRAFT  08-06-2003  in [place], [place]   x5-- LIMA - LAD,  seqVG to Diagonal, OM, PDA, and PLA  . CYSTOSCOPY WITH RETROGRADE PYELOGRAM, URETEROSCOPY AND STENT PLACEMENT Right 08/10/2016   Procedure: CYSTOSCOPY WITH RETROGRADE PYELOGRAM, URETEROSCOPY AND STENT PLACEMENT;  Surgeon: Alexis Frock, MD;  Location: [facility];  Service: Urology;  Laterality: Right;  . FIXATION RIGHT WRIST Right 2011   stability for arthritis  . INGUINAL HERNIA REPAIR Right 08-08-2015   [facility]- health care at [place]  . ROTATOR CUFF REPAIR Right 2004;  2007  . WISDOM TOOTH EXTRACTION     Family History  Problem Relation Age of Onset  . Heart attack Mother   . Alzheimer's disease Mother   . Cancer Father        throat cancer, chewing tobacco  . 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 If you have any questions, ask your nurse or doctor.        aspirin 81 MG tablet Take 81 mg by mouth daily.   atorvastatin 80 MG tablet Commonly known as: LIPITOR Take 40 mg by mouth every evening.   Fish Oil 1000 MG Caps Take 1 capsule by mouth every morning.   multivitamin tablet Take 1 tablet by mouth every morning.   RED YEAST RICE PO Take 1 capsule by mouth every morning.       All past medical history, surgical history, allergies, family history, immunizations andmedications were updated in the EMR today and  reviewed under the history and medication portions of their EMR.    Recent Results (from the past 2160 hour(s))  TSH     Status: None   Collection Time: 11/28/18 10:27 AM  Result Value Ref Range   TSH 1.330 0.450 - 4.500 uIU/mL  T4, free     Status: None   Collection Time: 11/28/18 10:27 AM  Result Value Ref Range   Free T4 1.02 0.82 - 1.77 ng/dL    ROS: 14 pt review of systems performed and negative (unless mentioned in an HPI)  Objective: BP 107/68 (BP Location: Right  Arm, Patient Position: Sitting, Cuff Size: Normal)   Pulse 69   Temp (!) 97.3 F (36.3 C) (Temporal)   Resp 16   Ht 5' 4.75" (1.645 m)   Wt 164 lb (74.4 kg)   SpO2 96%   BMI 27.50 kg/m  Gen: Afebrile. No acute distress. Nontoxic in appearance, well-developed, well-nourished,  Mildly overweight, very pleasant caucasian male.  HENT: AT. [place]. no Cough on exam, no hoarseness on exam. Eyes:Pupils Equal Round Reactive to light, Extraocular movements intact,  Conjunctiva without redness, discharge or icterus. CV: RRR no murmur, no edema Chest: CTAB, no wheeze, rhonchi or crackles. Skin:  Warm and well-perfused. Skin intact. Neuro/Msk:  Normal gait. PERLA. EOMi. Alert. Oriented x3.  Psych: Normal affect, dress and demeanor. Normal speech. Normal thought content and judgment.   Assessment/plan: SUHEB BERMUDES is a 73 y.o. male present for TOC-new to this provider.  Atrial flutter, unspecified type (HCC)/Hyperlipidemia, unspecified hyperlipidemia type/overweight Stable. Continue follow up with Dr. 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 Tresa Moore- partial nephrectomy r/o RCC   Return if symptoms worsen or fail to improve.    > 25 minutes spent with patient, >50% of time spent face to face      Note is dictated utilizing voice recognition software. Although note has been proof read prior to signing, occasional  typographical errors still can be missed. If any questions arise, please do not hesitate to call for verification.  Electronically signed by: Howard Pouch, DO [place]

## 2019-01-26 DIAGNOSIS — N281 Cyst of kidney, acquired: Secondary | ICD-10-CM | POA: Diagnosis not present

## 2019-01-26 DIAGNOSIS — C642 Malignant neoplasm of left kidney, except renal pelvis: Secondary | ICD-10-CM | POA: Diagnosis not present

## 2019-01-26 DIAGNOSIS — N201 Calculus of ureter: Secondary | ICD-10-CM | POA: Diagnosis not present

## 2019-01-27 NOTE — Progress Notes (Signed)
PCP - Jeryl Columbia  Cardiologist - Melina Copa, PAC 11-28-18 (LOV) f/u 20yr  Chest x-ray -   EKG - 11-28-18   Stress Test -  ECHO -  Cardiac Cath -   Sleep Study -  CPAP -   Fasting Blood Sugar -  Checks Blood Sugar _____ times a day  Blood Thinner Instructions:  Aspirin Instructions: 81 mg Last Dose: 01/26/19  Anesthesia review:  Hx of CAD, CABG, MI, HTN, A-Flutter  Patient denies shortness of breath, fever, cough and chest pain at PAT appointment   Patient verbalized understanding of instructions that were given to them at the PAT appointment. Patient was also instructed that they will need to review over the PAT instructions again at home before surgery.

## 2019-01-27 NOTE — Patient Instructions (Addendum)
DUE TO COVID-19 ONLY ONE VISITOR IS ALLOWED TO COME WITH YOU AND STAY IN THE WAITING ROOM ONLY DURING PRE OP AND PROCEDURE. THE ONE VISITOR MAY VISIT WITH YOU IN YOUR PRIVATE ROOM DURING VISITING HOURS ONLY!!   COVID SWAB TESTING MUST BE COMPLETED ON:   01/31/2019   [place] [place] [facility] enter pre surgical testing line (Must self quarantine after testing. Follow instructions on handout.)         Your procedure is scheduled on: 02/04/2019    Report to [facility] Main  Entrance    Report to Admitting at 6:30 AM   Call this number if you have problems the morning of surgery [phone]  Please consume a Clear Liquid Diet along with your prep, per your surgeon's instructions.   Do not eat food or drink liquids :After Midnight.   CLEAR LIQUID DIET   Foods Allowed                                                                     Foods Excluded  Coffee and tea, regular and decaf                             liquids that you cannot  Plain Jell-O any favor except red or purple                                           see through such as: Fruit ices (not with fruit pulp)                                     milk, soups, orange juice  Iced Popsicles                                    All solid food Carbonated beverages, regular and diet                                    Cranberry, grape and apple juices Sports drinks like Gatorade Lightly seasoned clear broth or consume(fat free) Sugar, honey syrup  Sample Menu Breakfast                                Lunch                                     Supper Cranberry juice                    Beef broth                            Chicken broth Jell-O  Grape juice                           Apple juice Coffee or tea                        Jell-O                                      Popsicle                                                Coffee or tea                         Coffee or tea  _____________________________________________________________________                  Dennis Bast may not have any metal on your body including  jewelry, and body piercings              Do not wear lotions, powders, cologne, or deodorant              Men may shave face and neck.   Do not bring valuables to the hospital. [place].   Contacts, dentures or bridgework may not be worn into surgery.   Bring small overnight bag day of surgery.    Special Instructions: Bring a copy of your healthcare power of attorney and living will documents  the day of surgery if you haven't scanned them  in before.         [place] - Preparing for Surgery Before surgery, you can play an important role.  Because skin is not sterile, your skin needs to be as free of germs as possible.  You can reduce the number of germs on your skin by washing with CHG (chlorahexidine gluconate) soap before surgery.  CHG is an antiseptic cleaner which kills germs and bonds with the skin to continue killing germs even after washing. Please DO NOT use if you have an allergy to CHG or antibacterial soaps.  If your skin becomes reddened/irritated stop using the CHG and inform your nurse when you arrive at Short Stay. Do not shave (including legs and underarms) for at least 48 hours prior to the first CHG shower.  You may shave your face/neck. Please follow these instructions carefully:  1.  Shower with CHG Soap the night before surgery and the  morning of Surgery.  2.  If you choose to wash your hair, wash your hair first as usual with your  normal  shampoo.  3.  After you shampoo, rinse your hair and body thoroughly to remove the  shampoo.                           4.  Use CHG as you would any other liquid soap.  You can apply chg directly  to the skin and wash                       Gently with  a scrungie or clean washcloth.  5.  Apply the CHG Soap to  your body ONLY FROM THE NECK DOWN.   Do not use on face/ open                           Wound or open sores. Avoid contact with eyes, ears mouth and genitals (private parts).                       Wash face,  Genitals (private parts) with your normal soap.             6.  Wash thoroughly, paying special attention to the area where your surgery  will be performed.  7.  Thoroughly rinse your body with warm water from the neck down.  8.  DO NOT shower/wash with your normal soap after using and rinsing off  the CHG Soap.                9.  Pat yourself dry with a clean towel.            10.  Wear clean pajamas.            11.  Place clean sheets on your bed the night of your first shower and do not  sleep with pets. Day of Surgery : Do not apply any lotions/deodorants the morning of surgery.  Please wear clean clothes to the hospital/surgery center.  FAILURE TO FOLLOW THESE INSTRUCTIONS MAY RESULT IN THE CANCELLATION OF YOUR SURGERY PATIENT SIGNATURE_________________________________  NURSE SIGNATURE__________________________________  ________________________________________________________________________

## 2019-01-31 ENCOUNTER — Other Ambulatory Visit (HOSPITAL_COMMUNITY)
Admission: RE | Admit: 2019-01-31 | Discharge: 2019-01-31 | Disposition: A | Discharge status: Home / Self Care | Payer: PPO | Source: Ambulatory Visit | Attending: Urology | Admitting: Urology

## 2019-01-31 DIAGNOSIS — Z01812 Encounter for preprocedural laboratory examination: Secondary | ICD-10-CM | POA: Insufficient documentation

## 2019-01-31 DIAGNOSIS — Z20828 Contact with and (suspected) exposure to other viral communicable diseases: Secondary | ICD-10-CM | POA: Insufficient documentation

## 2019-02-01 LAB — NOVEL CORONAVIRUS, NAA (HOSP ORDER, SEND-OUT TO REF LAB; TAT 18-24 HRS): SARS-CoV-2, NAA: NOT DETECTED

## 2019-02-02 ENCOUNTER — Encounter (HOSPITAL_COMMUNITY): Payer: Self-pay

## 2019-02-02 ENCOUNTER — Encounter (HOSPITAL_COMMUNITY)
Admission: RE | Admit: 2019-02-02 | Discharge: 2019-02-02 | Disposition: A | Discharge status: Home / Self Care | Payer: PPO | Source: Ambulatory Visit | Attending: Urology | Admitting: Urology

## 2019-02-02 ENCOUNTER — Other Ambulatory Visit: Payer: Self-pay

## 2019-02-02 DIAGNOSIS — N2889 Other specified disorders of kidney and ureter: Secondary | ICD-10-CM | POA: Insufficient documentation

## 2019-02-02 DIAGNOSIS — Z7982 Long term (current) use of aspirin: Secondary | ICD-10-CM | POA: Diagnosis not present

## 2019-02-02 DIAGNOSIS — Z85828 Personal history of other malignant neoplasm of skin: Secondary | ICD-10-CM | POA: Diagnosis not present

## 2019-02-02 DIAGNOSIS — I252 Old myocardial infarction: Secondary | ICD-10-CM | POA: Insufficient documentation

## 2019-02-02 DIAGNOSIS — Z87442 Personal history of urinary calculi: Secondary | ICD-10-CM | POA: Diagnosis not present

## 2019-02-02 DIAGNOSIS — I2510 Atherosclerotic heart disease of native coronary artery without angina pectoris: Secondary | ICD-10-CM | POA: Insufficient documentation

## 2019-02-02 DIAGNOSIS — Z951 Presence of aortocoronary bypass graft: Secondary | ICD-10-CM | POA: Diagnosis not present

## 2019-02-02 DIAGNOSIS — Z01818 Encounter for other preprocedural examination: Secondary | ICD-10-CM | POA: Insufficient documentation

## 2019-02-02 DIAGNOSIS — Z79899 Other long term (current) drug therapy: Secondary | ICD-10-CM | POA: Insufficient documentation

## 2019-02-02 DIAGNOSIS — I10 Essential (primary) hypertension: Secondary | ICD-10-CM | POA: Insufficient documentation

## 2019-02-02 LAB — BASIC METABOLIC PANEL
Anion gap: 7 (ref 5–15)
BUN: 20 mg/dL (ref 8–23)
CO2: 27 mmol/L (ref 22–32)
Calcium: 9.1 mg/dL (ref 8.9–10.3)
Chloride: 109 mmol/L (ref 98–111)
Creatinine, Ser: 0.98 mg/dL (ref 0.61–1.24)
GFR calc Af Amer: >60 mL/min (ref >60)
GFR calc non Af Amer: >60 mL/min (ref >60)
Glucose, Bld: 105 mg/dL — ABNORMAL HIGH (ref 70–99)
Potassium: 4.7 mmol/L (ref 3.5–5.1)
Sodium: 143 mmol/L (ref 135–145)

## 2019-02-02 LAB — CBC
HCT: 41.7 % (ref 39.0–52.0)
Hemoglobin: 13.1 g/dL (ref 13.0–17.0)
MCH: 29.3 pg (ref 26.0–34.0)
MCHC: 31.4 g/dL (ref 30.0–36.0)
MCV: 93.3 fL (ref 80.0–100.0)
Platelets: 212 10*3/uL (ref 150–400)
RBC: 4.47 MIL/uL (ref 4.22–5.81)
RDW: 13.4 % (ref 11.5–15.5)
WBC: 4.8 10*3/uL (ref 4.0–10.5)
nRBC: 0 % (ref 0.0–0.2)

## 2019-02-03 ENCOUNTER — Encounter (HOSPITAL_COMMUNITY): Payer: Self-pay | Admitting: Anesthesiology

## 2019-02-03 NOTE — Progress Notes (Signed)
Anesthesia Chart Review   Case: [id number] Date/Time: 02/04/19 0800   Procedure: XI ROBOTIC ASSITED PARTIAL NEPHRECTOMY (Left ) - 3 HRS   Anesthesia type: General   Pre-op diagnosis: LEFT RENAL MASS   Location: [place] [facility] / [facility]   Surgeon: Alexis Frock, MD      DISCUSSION:73 y.o. never smoker with h/o HTN, CAD (MI, CABG 2001), atrial flutter s/p ablation 2006 and 2011, BPH, left renal mass scheduled for above procedure 02/04/2019 with Dr. Alexis Frock.   Pt last seen by cardiology 11/28/2018.  Seen by Melina Copa, PA-C.  Per OV note, "He returns for routine f/u overall feeling well. He has remained very active exercising 4 days a week without angina or dyspnea. In Feb/March of this year he had some wooziness so self-discontinued his metoprolol with resolution of symptoms. He's felt well since that time without complaint. No further dizziness. No pre-syncope or syncope. No palpitations. He still uses snuff so we discussed d/c of tobacco."  CAD stable, no recurrence of atrial flutter.  1 year follow up recommended.    Anticipate pt can proceed with planned procedure barring acute status change.   VS: BP (!) 145/71   Pulse (!) 55   Temp 36.6 C (Oral)   Resp 16   Ht 5\' 5"  (1.651 m)   Wt 74.4 kg   SpO2 100%   BMI 27.29 kg/m   PROVIDERS: Kuneff, Renee A, DO is PCP last seen 01/12/2019 stable at this visit, aware of upcoming procedure  Larae Grooms, MD is Cardiologist  Thompson Grayer, MD is electrophysiologist  LABS: Labs reviewed: Acceptable for surgery. (all labs ordered are listed, but only abnormal results are displayed)  Labs Reviewed  BASIC METABOLIC PANEL - Abnormal; Notable for the following components:      Result Value   Glucose, Bld 105 (*)    All other components within normal limits  CBC     IMAGES:   EKG: 11/28/2018 Rate 60 bpm Normal sinus rhythm  Incomplete RBBB  CV:  Past Medical History:  Diagnosis Date  . Allergic rhinitis   . BPH (benign  prostatic hyperplasia)   . Chicken pox   . Coronary artery disease    a. MI 2003 with CABG.  . Hematuria   . History of basal cell carcinoma (BCC) excision    left upper back, right chest area  . History of colon polyps   . History of MI (myocardial infarction) 08/04/2001   s/p  cabg  . Hydronephrosis, right   . Hypertension   . HYPERTENSION, MILD 05/18/2009   Qualifier: Diagnosis of  By: Rayann Heman, MD, Jeneen Rinks    . Incomplete right bundle branch block (RBBB)   . 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 Osteoarthritis of knee 05/13/2017  . Right ureteral stone   . S/P ablation of atrial flutter    followed by dr Rayann Heman (EP cardiolgoist)  ablation 04-17-2004  and by dr allred 02-10- 2011 was successful  . S/P CABG x 5 08-05-2001   in [place] , [place]   LIMA to LAD,  seqVG to Diagonal,  SeqVG to OM,  seqVG to PDA and PLA  . Seasonal allergies   . Sinus bradycardia   . Wears glasses     Past Surgical History:  Procedure Laterality Date  . CARDIAC CATHETERIZATION  08-04-2001  in [place], [place]   severe 3 vessel cad  . CARDIAC ELECTROPHYSIOLOGY MAPPING AND ABLATION  x2  04-17-2004 [place], [place]);  04-14-2009 by dr allred   per dr allred report-- successful ablation clockwise isthmus-dependent right atrial flutter along the usual cavotricupid isthmus;  complete bidirectionl isthmus block achieved  . CORONARY ARTERY BYPASS GRAFT  08-06-2003  in [place], [place]   x5-- LIMA - LAD,  seqVG to Diagonal, OM, PDA, and PLA  . CYSTOSCOPY WITH RETROGRADE PYELOGRAM, URETEROSCOPY AND STENT PLACEMENT Right 08/10/2016   Procedure: CYSTOSCOPY WITH RETROGRADE PYELOGRAM, URETEROSCOPY AND STENT PLACEMENT;  Surgeon: Alexis Frock, MD;  Location: [facility];  Service: Urology;  Laterality: Right;  . FIXATION RIGHT WRIST Right 2011   stability for arthritis  . INGUINAL HERNIA REPAIR Right 08-08-2015   [facility]- health care at [place]  . ROTATOR CUFF REPAIR Right 2004;  2007  . WISDOM TOOTH  EXTRACTION      MEDICATIONS: . vitamin C (ASCORBIC ACID) 500 MG tablet  . zinc gluconate 50 MG tablet  . aspirin 81 MG tablet  . atorvastatin (LIPITOR) 40 MG tablet  . Multiple Vitamin (MULTIVITAMIN) tablet  . Omega-3 Fatty Acids (FISH OIL) 1000 MG CAPS  . Red Yeast Rice 600 MG CAPS   No current facility-administered medications for this encounter.     Maia Plan [facility] Pre-Surgical Testing [phone] 02/03/19  12:26 PM

## 2019-02-03 NOTE — Anesthesia Preprocedure Evaluation (Addendum)
Anesthesia Evaluation  Patient identified by MRN, date of birth, ID band Patient awake    Reviewed: Allergy & Precautions, NPO status , Patient's Chart, lab work & pertinent test results  Airway Mallampati: I       Dental  (+) Teeth Intact, Poor Dentition   Pulmonary neg pulmonary ROS,    Pulmonary exam normal breath sounds clear to auscultation       Cardiovascular hypertension, + CAD and + CABG  Normal cardiovascular exam Rhythm:Regular Rate:Normal     Neuro/Psych negative neurological ROS  negative psych ROS   GI/Hepatic negative GI ROS, Neg liver ROS,   Endo/Other    Renal/GU   negative genitourinary   Musculoskeletal   Abdominal Normal abdominal exam  (+)   Peds  Hematology negative hematology ROS (+)   Anesthesia Other Findings   Reproductive/Obstetrics                           Anesthesia Physical Anesthesia Plan  ASA: III  Anesthesia Plan: General   Post-op Pain Management:    Induction: Intravenous  PONV Risk Score and Plan: 4 or greater and Ondansetron, Dexamethasone and Treatment may vary due to age or medical condition  Airway Management Planned: Oral ETT  Additional Equipment: None  Intra-op Plan:   Post-operative Plan: Extubation in OR  Informed Consent: I have reviewed the patients History and Physical, chart, labs and discussed the procedure including the risks, benefits and alternatives for the proposed anesthesia with the patient or authorized representative who has indicated his/her understanding and acceptance.     Dental advisory given  Plan Discussed with: CRNA  Anesthesia Plan Comments:        Anesthesia Quick Evaluation

## 2019-02-04 ENCOUNTER — Ambulatory Visit (HOSPITAL_COMMUNITY): Payer: PPO | Admitting: Anesthesiology

## 2019-02-04 ENCOUNTER — Ambulatory Visit (HOSPITAL_COMMUNITY): Payer: PPO | Admitting: Physician Assistant

## 2019-02-04 ENCOUNTER — Observation Stay (HOSPITAL_COMMUNITY)
Admission: RE | Admit: 2019-02-04 | Discharge: 2019-02-05 | Disposition: A | Discharge status: Home / Self Care | Payer: PPO | Attending: Urology | Admitting: Urology

## 2019-02-04 ENCOUNTER — Encounter (HOSPITAL_COMMUNITY): Payer: Self-pay

## 2019-02-04 ENCOUNTER — Other Ambulatory Visit: Payer: Self-pay

## 2019-02-04 ENCOUNTER — Encounter (HOSPITAL_COMMUNITY)
Admission: RE | Disposition: A | Discharge status: Home / Self Care | Payer: Self-pay | Source: Home / Self Care | Attending: Urology

## 2019-02-04 DIAGNOSIS — I4510 Unspecified right bundle-branch block: Secondary | ICD-10-CM | POA: Insufficient documentation

## 2019-02-04 DIAGNOSIS — N400 Enlarged prostate without lower urinary tract symptoms: Secondary | ICD-10-CM | POA: Insufficient documentation

## 2019-02-04 DIAGNOSIS — D3502 Benign neoplasm of left adrenal gland: Secondary | ICD-10-CM | POA: Diagnosis not present

## 2019-02-04 DIAGNOSIS — Z951 Presence of aortocoronary bypass graft: Secondary | ICD-10-CM | POA: Diagnosis not present

## 2019-02-04 DIAGNOSIS — I10 Essential (primary) hypertension: Secondary | ICD-10-CM | POA: Insufficient documentation

## 2019-02-04 DIAGNOSIS — I4892 Unspecified atrial flutter: Secondary | ICD-10-CM | POA: Diagnosis not present

## 2019-02-04 DIAGNOSIS — J302 Other seasonal allergic rhinitis: Secondary | ICD-10-CM | POA: Insufficient documentation

## 2019-02-04 DIAGNOSIS — Z7982 Long term (current) use of aspirin: Secondary | ICD-10-CM | POA: Insufficient documentation

## 2019-02-04 DIAGNOSIS — M1711 Unilateral primary osteoarthritis, right knee: Secondary | ICD-10-CM | POA: Diagnosis not present

## 2019-02-04 DIAGNOSIS — N2889 Other specified disorders of kidney and ureter: Secondary | ICD-10-CM | POA: Diagnosis present

## 2019-02-04 DIAGNOSIS — I2510 Atherosclerotic heart disease of native coronary artery without angina pectoris: Secondary | ICD-10-CM | POA: Insufficient documentation

## 2019-02-04 DIAGNOSIS — C642 Malignant neoplasm of left kidney, except renal pelvis: Secondary | ICD-10-CM | POA: Diagnosis not present

## 2019-02-04 DIAGNOSIS — E278 Other specified disorders of adrenal gland: Secondary | ICD-10-CM | POA: Diagnosis not present

## 2019-02-04 DIAGNOSIS — Z79899 Other long term (current) drug therapy: Secondary | ICD-10-CM | POA: Insufficient documentation

## 2019-02-04 DIAGNOSIS — R001 Bradycardia, unspecified: Secondary | ICD-10-CM | POA: Diagnosis not present

## 2019-02-04 DIAGNOSIS — C649 Malignant neoplasm of unspecified kidney, except renal pelvis: Secondary | ICD-10-CM

## 2019-02-04 DIAGNOSIS — I252 Old myocardial infarction: Secondary | ICD-10-CM | POA: Insufficient documentation

## 2019-02-04 HISTORY — PX: ROBOTIC ASSITED PARTIAL NEPHRECTOMY: SHX6087

## 2019-02-04 HISTORY — DX: Malignant neoplasm of unspecified kidney, except renal pelvis: C64.9

## 2019-02-04 LAB — HEMOGLOBIN AND HEMATOCRIT, BLOOD
HCT: 37.9 % — ABNORMAL LOW (ref 39.0–52.0)
Hemoglobin: 12.1 g/dL — ABNORMAL LOW (ref 13.0–17.0)

## 2019-02-04 SURGERY — NEPHRECTOMY, PARTIAL, ROBOT-ASSISTED
Anesthesia: General | Laterality: Left

## 2019-02-04 MED ORDER — ONDANSETRON HCL 4 MG/2ML IJ SOLN
INTRAMUSCULAR | Status: DC | PRN
  Administered 2019-02-04: 4 mg via INTRAVENOUS

## 2019-02-04 MED ORDER — BUPIVACAINE LIPOSOME 1.3 % IJ SUSP
20.0000 mL | Freq: Once | INTRAMUSCULAR | Status: AC
  Administered 2019-02-04: 20 mL
  Filled 2019-02-04: qty 20

## 2019-02-04 MED ORDER — HYDROCODONE-ACETAMINOPHEN 5-325 MG PO TABS: 1.0000 | ORAL_TABLET | Freq: Four times a day (QID) | ORAL | 0 refills | Status: DC | PRN

## 2019-02-04 MED ORDER — SODIUM CHLORIDE (PF) 0.9 % IJ SOLN
INTRAMUSCULAR | Status: AC
  Filled 2019-02-04: qty 10

## 2019-02-04 MED ORDER — DIPHENHYDRAMINE HCL 50 MG/ML IJ SOLN: 12.5000 mg | Freq: Four times a day (QID) | INTRAMUSCULAR | Status: DC | PRN

## 2019-02-04 MED ORDER — SUFENTANIL CITRATE 50 MCG/ML IV SOLN
INTRAVENOUS | Status: AC
  Filled 2019-02-04: qty 1

## 2019-02-04 MED ORDER — PROMETHAZINE HCL 25 MG/ML IJ SOLN: 6.2500 mg | INTRAMUSCULAR | Status: DC | PRN

## 2019-02-04 MED ORDER — SUGAMMADEX SODIUM 200 MG/2ML IV SOLN
INTRAVENOUS | Status: DC | PRN
  Administered 2019-02-04: 180 mg via INTRAVENOUS

## 2019-02-04 MED ORDER — ROCURONIUM BROMIDE 10 MG/ML (PF) SYRINGE
PREFILLED_SYRINGE | INTRAVENOUS | Status: DC | PRN
  Administered 2019-02-04: 60 mg via INTRAVENOUS
  Administered 2019-02-04: 20 mg via INTRAVENOUS

## 2019-02-04 MED ORDER — ROCURONIUM BROMIDE 10 MG/ML (PF) SYRINGE
PREFILLED_SYRINGE | INTRAVENOUS | Status: AC
  Filled 2019-02-04: qty 10

## 2019-02-04 MED ORDER — HYDROMORPHONE HCL 1 MG/ML IJ SOLN
INTRAMUSCULAR | Status: AC
  Administered 2019-02-04: 0.25 mg via INTRAVENOUS
  Filled 2019-02-04: qty 1

## 2019-02-04 MED ORDER — LACTATED RINGERS IR SOLN
Status: DC | PRN
  Administered 2019-02-04: 1

## 2019-02-04 MED ORDER — HYDROMORPHONE HCL 1 MG/ML IJ SOLN
0.5000 mg | INTRAMUSCULAR | Status: DC | PRN
  Administered 2019-02-04 (×3): 1 mg via INTRAVENOUS
  Filled 2019-02-04 (×4): qty 1

## 2019-02-04 MED ORDER — ATORVASTATIN CALCIUM 40 MG PO TABS
40.0000 mg | ORAL_TABLET | Freq: Every day | ORAL | Status: DC
  Administered 2019-02-04: 22:00:00 40 mg via ORAL
  Filled 2019-02-04: qty 1

## 2019-02-04 MED ORDER — CEFAZOLIN SODIUM-DEXTROSE 2-4 GM/100ML-% IV SOLN
2.0000 g | INTRAVENOUS | Status: AC
  Administered 2019-02-04: 09:00:00 2 g via INTRAVENOUS
  Filled 2019-02-04: qty 100

## 2019-02-04 MED ORDER — ONDANSETRON HCL 4 MG/2ML IJ SOLN: 4.0000 mg | INTRAMUSCULAR | Status: DC | PRN

## 2019-02-04 MED ORDER — LACTATED RINGERS IV SOLN
INTRAVENOUS | Status: DC
  Administered 2019-02-04 (×2): via INTRAVENOUS

## 2019-02-04 MED ORDER — DOCUSATE SODIUM 100 MG PO CAPS
100.0000 mg | ORAL_CAPSULE | Freq: Two times a day (BID) | ORAL | Status: DC
  Administered 2019-02-04 – 2019-02-05 (×2): 100 mg via ORAL
  Filled 2019-02-04 (×2): qty 1

## 2019-02-04 MED ORDER — MEPERIDINE HCL 50 MG/ML IJ SOLN: 6.2500 mg | INTRAMUSCULAR | Status: DC | PRN

## 2019-02-04 MED ORDER — CHLORHEXIDINE GLUCONATE CLOTH 2 % EX PADS
6.0000 | MEDICATED_PAD | Freq: Every day | CUTANEOUS | Status: DC
  Administered 2019-02-04: 6 via TOPICAL

## 2019-02-04 MED ORDER — DEXAMETHASONE SODIUM PHOSPHATE 10 MG/ML IJ SOLN
INTRAMUSCULAR | Status: AC
  Filled 2019-02-04: qty 1

## 2019-02-04 MED ORDER — SUFENTANIL CITRATE 50 MCG/ML IV SOLN
INTRAVENOUS | Status: DC | PRN
  Administered 2019-02-04: 15 ug via INTRAVENOUS
  Administered 2019-02-04 (×3): 10 ug via INTRAVENOUS

## 2019-02-04 MED ORDER — OXYCODONE HCL 5 MG PO TABS
5.0000 mg | ORAL_TABLET | ORAL | Status: DC | PRN
  Administered 2019-02-05: 11:00:00 5 mg via ORAL
  Filled 2019-02-04: qty 1

## 2019-02-04 MED ORDER — PEG 3350-KCL-NA BICARB-NACL 420 G PO SOLR
4000.0000 mL | Freq: Once | ORAL | Status: DC
  Filled 2019-02-04: qty 4000

## 2019-02-04 MED ORDER — HYDROMORPHONE HCL 1 MG/ML IJ SOLN
INTRAMUSCULAR | Status: AC
  Filled 2019-02-04: qty 1

## 2019-02-04 MED ORDER — LIDOCAINE 2% (20 MG/ML) 5 ML SYRINGE
INTRAMUSCULAR | Status: AC
  Filled 2019-02-04: qty 5

## 2019-02-04 MED ORDER — STERILE WATER FOR IRRIGATION IR SOLN
Status: DC | PRN
  Administered 2019-02-04: 1000 mL

## 2019-02-04 MED ORDER — PROPOFOL 10 MG/ML IV BOLUS
INTRAVENOUS | Status: AC
  Filled 2019-02-04: qty 20

## 2019-02-04 MED ORDER — PROPOFOL 10 MG/ML IV BOLUS
INTRAVENOUS | Status: DC | PRN
  Administered 2019-02-04: 150 mg via INTRAVENOUS

## 2019-02-04 MED ORDER — KETOROLAC TROMETHAMINE 15 MG/ML IJ SOLN: 15.0000 mg | Freq: Once | INTRAMUSCULAR | Status: DC

## 2019-02-04 MED ORDER — LIDOCAINE 2% (20 MG/ML) 5 ML SYRINGE
INTRAMUSCULAR | Status: DC | PRN
  Administered 2019-02-04: 100 mg via INTRAVENOUS

## 2019-02-04 MED ORDER — SODIUM CHLORIDE (PF) 0.9 % IJ SOLN
INTRAMUSCULAR | Status: AC
  Filled 2019-02-04: qty 20

## 2019-02-04 MED ORDER — DEXTROSE-NACL 5-0.45 % IV SOLN
INTRAVENOUS | Status: DC
  Administered 2019-02-04: 17:00:00 via INTRAVENOUS

## 2019-02-04 MED ORDER — ACETAMINOPHEN 500 MG PO TABS
1000.0000 mg | ORAL_TABLET | Freq: Four times a day (QID) | ORAL | Status: DC
  Administered 2019-02-04 – 2019-02-05 (×3): 1000 mg via ORAL
  Filled 2019-02-04 (×3): qty 2

## 2019-02-04 MED ORDER — BELLADONNA ALKALOIDS-OPIUM 16.2-60 MG RE SUPP: 1.0000 | Freq: Four times a day (QID) | RECTAL | Status: DC | PRN

## 2019-02-04 MED ORDER — ONDANSETRON HCL 4 MG/2ML IJ SOLN
INTRAMUSCULAR | Status: AC
  Filled 2019-02-04: qty 2

## 2019-02-04 MED ORDER — DIPHENHYDRAMINE HCL 12.5 MG/5ML PO ELIX: 12.5000 mg | ORAL_SOLUTION | Freq: Four times a day (QID) | ORAL | Status: DC | PRN

## 2019-02-04 MED ORDER — HYDROMORPHONE HCL 1 MG/ML IJ SOLN
0.2500 mg | INTRAMUSCULAR | Status: DC | PRN
  Administered 2019-02-04: 0.25 mg via INTRAVENOUS
  Administered 2019-02-04 (×2): 0.5 mg via INTRAVENOUS
  Administered 2019-02-04: 11:00:00 0.25 mg via INTRAVENOUS

## 2019-02-04 MED ORDER — DEXAMETHASONE SODIUM PHOSPHATE 10 MG/ML IJ SOLN
INTRAMUSCULAR | Status: DC | PRN
  Administered 2019-02-04: 10 mg via INTRAVENOUS

## 2019-02-04 MED ORDER — SODIUM CHLORIDE (PF) 0.9 % IJ SOLN
INTRAMUSCULAR | Status: DC | PRN
  Administered 2019-02-04: 20 mL

## 2019-02-04 MED ORDER — BACITRACIN-NEOMYCIN-POLYMYXIN 400-5-5000 EX OINT: 1.0000 "application " | TOPICAL_OINTMENT | Freq: Three times a day (TID) | CUTANEOUS | Status: DC | PRN

## 2019-02-04 MED ORDER — EPHEDRINE 5 MG/ML INJ
INTRAVENOUS | Status: AC
  Filled 2019-02-04: qty 10

## 2019-02-04 MED ORDER — EPHEDRINE SULFATE-NACL 50-0.9 MG/10ML-% IV SOSY
PREFILLED_SYRINGE | INTRAVENOUS | Status: DC | PRN
  Administered 2019-02-04: 10 mg via INTRAVENOUS

## 2019-02-04 SURGICAL SUPPLY — 72 items
APPLICATOR SURGIFLO ENDO (HEMOSTASIS) ×3 IMPLANT
CHLORAPREP W/TINT 26 (MISCELLANEOUS) ×3 IMPLANT
CLIP SUT LAPRA TY ABSORB (SUTURE) ×3 IMPLANT
CLIP VESOLOCK LG 6/CT PURPLE (CLIP) ×3 IMPLANT
CLIP VESOLOCK MED LG 6/CT (CLIP) ×6 IMPLANT
CLIP VESOLOCK XL 6/CT (CLIP) IMPLANT
COVER SURGICAL LIGHT HANDLE (MISCELLANEOUS) ×3 IMPLANT
COVER TIP SHEARS 8 DVNC (MISCELLANEOUS) ×1 IMPLANT
COVER TIP SHEARS 8MM DA VINCI (MISCELLANEOUS) ×2
COVER WAND RF STERILE (DRAPES) IMPLANT
DECANTER SPIKE VIAL GLASS SM (MISCELLANEOUS) ×3 IMPLANT
DERMABOND ADVANCED (GAUZE/BANDAGES/DRESSINGS) ×2
DERMABOND ADVANCED .7 DNX12 (GAUZE/BANDAGES/DRESSINGS) ×1 IMPLANT
DRAIN CHANNEL 15F RND FF 3/16 (WOUND CARE) ×3 IMPLANT
DRAPE ARM DVNC X/XI (DISPOSABLE) ×4 IMPLANT
DRAPE COLUMN DVNC XI (DISPOSABLE) ×1 IMPLANT
DRAPE DA VINCI XI ARM (DISPOSABLE) ×8
DRAPE DA VINCI XI COLUMN (DISPOSABLE) ×2
DRAPE INCISE IOBAN 66X45 STRL (DRAPES) ×3 IMPLANT
DRAPE SHEET LG 3/4 BI-LAMINATE (DRAPES) ×3 IMPLANT
DRSG TEGADERM 4X4.75 (GAUZE/BANDAGES/DRESSINGS) ×2 IMPLANT
ELECT REM PT RETURN 15FT ADLT (MISCELLANEOUS) ×3 IMPLANT
EVACUATOR SILICONE 100CC (DRAIN) ×3 IMPLANT
GAUZE SPONGE 2X2 8PLY STRL LF (GAUZE/BANDAGES/DRESSINGS) IMPLANT
GLOVE BIO SURGEON STRL SZ 6.5 (GLOVE) ×2 IMPLANT
GLOVE BIO SURGEONS STRL SZ 6.5 (GLOVE) ×1
GLOVE BIOGEL M STRL SZ7.5 (GLOVE) ×6 IMPLANT
GOWN STRL REUS W/TWL LRG LVL3 (GOWN DISPOSABLE) ×6 IMPLANT
HEMOSTAT SURGICEL 4X8 (HEMOSTASIS) ×3 IMPLANT
IRRIG SUCT STRYKERFLOW 2 WTIP (MISCELLANEOUS) ×3
IRRIGATION SUCT STRKRFLW 2 WTP (MISCELLANEOUS) ×1 IMPLANT
KIT BASIN OR (CUSTOM PROCEDURE TRAY) ×3 IMPLANT
KIT TURNOVER KIT A (KITS) IMPLANT
LOOP VESSEL MAXI BLUE (MISCELLANEOUS) ×3 IMPLANT
MARKER SKIN DUAL TIP RULER LAB (MISCELLANEOUS) ×3 IMPLANT
NDL INSUFFLATION 14GA 120MM (NEEDLE) ×1 IMPLANT
NEEDLE INSUFFLATION 14GA 120MM (NEEDLE) ×3 IMPLANT
NS IRRIG 1000ML POUR BTL (IV SOLUTION) ×3 IMPLANT
PENCIL SMOKE EVACUATOR (MISCELLANEOUS) IMPLANT
PORT ACCESS TROCAR AIRSEAL 12 (TROCAR) ×1 IMPLANT
PORT ACCESS TROCAR AIRSEAL 5M (TROCAR) ×2
POUCH SPECIMEN RETRIEVAL 10MM (ENDOMECHANICALS) ×3 IMPLANT
PROTECTOR NERVE ULNAR (MISCELLANEOUS) ×6 IMPLANT
RELOAD STAPLE 60 2.6 WHT THN (STAPLE) IMPLANT
RELOAD STAPLER WHITE 60MM (STAPLE) IMPLANT
SEAL CANN UNIV 5-8 DVNC XI (MISCELLANEOUS) ×4 IMPLANT
SEAL XI 5MM-8MM UNIVERSAL (MISCELLANEOUS) ×8
SET TRI-LUMEN FLTR TB AIRSEAL (TUBING) ×3 IMPLANT
SOLUTION ELECTROLUBE (MISCELLANEOUS) ×3 IMPLANT
SPONGE GAUZE 2X2 STER 10/PKG (GAUZE/BANDAGES/DRESSINGS) ×2
SPONGE LAP 4X18 RFD (DISPOSABLE) ×3 IMPLANT
STAPLE ECHEON FLEX 60 POW ENDO (STAPLE) IMPLANT
STAPLER RELOAD WHITE 60MM (STAPLE)
SURGIFLO W/THROMBIN 8M KIT (HEMOSTASIS) ×3 IMPLANT
SUT ETHILON 3 0 PS 1 (SUTURE) ×3 IMPLANT
SUT MNCRL AB 4-0 PS2 18 (SUTURE) ×6 IMPLANT
SUT PDS AB 1 CT1 27 (SUTURE) ×6 IMPLANT
SUT V-LOC BARB 180 2/0GR6 GS22 (SUTURE)
SUT VIC AB 0 CT1 27 (SUTURE) ×8
SUT VIC AB 0 CT1 27XBRD ANTBC (SUTURE) ×4 IMPLANT
SUT VIC AB 2-0 SH 27 (SUTURE) ×4
SUT VIC AB 2-0 SH 27X BRD (SUTURE) ×2 IMPLANT
SUT VLOC BARB 180 ABS3/0GR12 (SUTURE) ×3
SUTURE V-LC BRB 180 2/0GR6GS22 (SUTURE) IMPLANT
SUTURE VLOC BRB 180 ABS3/0GR12 (SUTURE) ×1 IMPLANT
TOWEL OR 17X26 10 PK STRL BLUE (TOWEL DISPOSABLE) ×6 IMPLANT
TOWEL OR NON WOVEN STRL DISP B (DISPOSABLE) ×3 IMPLANT
TRAY FOLEY MTR SLVR 16FR STAT (SET/KITS/TRAYS/PACK) ×3 IMPLANT
TRAY LAPAROSCOPIC (CUSTOM PROCEDURE TRAY) ×3 IMPLANT
TROCAR BLADELESS OPT 5 100 (ENDOMECHANICALS) IMPLANT
TROCAR XCEL 12X100 BLDLESS (ENDOMECHANICALS) ×3 IMPLANT
WATER STERILE IRR 1000ML POUR (IV SOLUTION) ×6 IMPLANT

## 2019-02-04 NOTE — Transfer of Care (Signed)
Immediate Anesthesia Transfer of Care Note  Patient: Donald [name]  Procedure(s) Performed: XI ROBOTIC ASSITED LEFT PARTIAL NEPHRECTOMY; LEFT PARTIAL ADRENALECTOMY (Left )  Patient Location: PACU  Anesthesia Type:General  Level of Consciousness: awake and oriented  Airway & Oxygen Therapy: Patient Spontanous Breathing and Patient connected to face mask oxygen  Post-op Assessment: Report given to RN and Post -op Vital signs reviewed and stable  Post vital signs: Reviewed and stable  Last Vitals:  Vitals Value Taken Time  BP    Temp    Pulse 92 02/04/19 1046  Resp 20 02/04/19 1046  SpO2 100 % 02/04/19 1046  Vitals shown include unvalidated device data.  Last Pain:  Vitals:   02/04/19 0701  TempSrc:   PainSc: 0-No pain         Complications: No apparent anesthesia complications

## 2019-02-04 NOTE — Discharge Instructions (Signed)

## 2019-02-04 NOTE — Brief Op Note (Signed)
02/04/2019  10:29 AM  PATIENT:  Donald [name]  73 y.o. male  PRE-OPERATIVE DIAGNOSIS:  left renal mass  POST-OPERATIVE DIAGNOSIS:  left renal mass  PROCEDURE:  Procedure(s) with comments: XI ROBOTIC ASSITED LEFT PARTIAL NEPHRECTOMY; LEFT PARTIAL ADRENALECTOMY (Left) - 3 HRS  SURGEON:  Surgeon(s) and Role:    Alexis Frock, MD - Primary  PHYSICIAN ASSISTANT:   ASSISTANTS: Debbrah Alar PA   ANESTHESIA:   local and general  EBL:  50 mL   BLOOD ADMINISTERED:none  DRAINS: 1 - JP to bulb; 2 - Foley to gravity   LOCAL MEDICATIONS USED:  MARCAINE     SPECIMEN:  Source of Specimen:  1 - Left partial nephrectomy; 2 - Left adrenal nodule  DISPOSITION OF SPECIMEN:  PATHOLOGY  COUNTS:  YES  TOURNIQUET:  * No tourniquets in log *  DICTATION: .Other Dictation: Dictation Number  [id number]  PLAN OF CARE: Admit to inpatient   PATIENT DISPOSITION:  PACU - hemodynamically stable.   Delay start of Pharmacological VTE agent (>24hrs) due to surgical blood loss or risk of bleeding: yes

## 2019-02-04 NOTE — H&P (Signed)
Donald [name] is an 73 y.o. male.    Chief Complaint: Pre-op LEFT Partial Nephrectomy  HPI:   1 - Left Renal Cancer - 1.5cm left extreme upper pole solid mass incidental on CT 06/2016. Dedicated contrast MRI 10/2016 with 1.8cm solid enhancing mass c/w renal cell carcinoma, clinically localized. 1 artery (early brannch) / 1 vein (large inferior lumbar that gives off lower pole branch) renovascular anatomy.   Recent Surveillance:  11/2016 - Some size progression left mass, now 2cm.  12/2018 - Continued progression to 2.5cm, Cr 0.8, stable Rt cyst.   PMH sig for CAD/CABG, AFlutter/Ablation (follows [facility], [facility]), Rt inguinal hernia repair. Works part time at [employer] and exercisese 4X per week. His PCP is Colin Benton MD. He also gets annual exam / labs by [place].   Today "Donald [name]" is seen to proceed with LEFT partial nephrectomy. C19 screen negative.     Past Medical History:  Diagnosis Date  . Allergic rhinitis   . BPH (benign prostatic hyperplasia)   . Chicken pox   . Coronary artery disease    a. MI 2003 with CABG.  . Hematuria   . History of basal cell carcinoma (BCC) excision    left upper back, right chest area  . History of colon polyps   . History of MI (myocardial infarction) 08/04/2001   s/p  cabg  . Hydronephrosis, right   . Hypertension   . HYPERTENSION, MILD 05/18/2009   Qualifier: Diagnosis of  By: Rayann Heman, MD, Jeneen Rinks    . Incomplete right bundle branch block (RBBB)   . 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 Osteoarthritis of knee 05/13/2017  . Right ureteral stone   . S/P ablation of atrial flutter    followed by dr Rayann Heman (EP cardiolgoist)  ablation 04-17-2004  and by dr allred 02-10- 2011 was successful  . S/P CABG x 5 08-05-2001   in [place] , [place]   LIMA to LAD,  seqVG to Diagonal,  SeqVG to OM,  seqVG to PDA and PLA  . Seasonal allergies   . Sinus bradycardia   . Wears glasses     Past Surgical History:  Procedure Laterality Date  .  CARDIAC CATHETERIZATION  08-04-2001  in [place], [place]   severe 3 vessel cad  . CARDIAC ELECTROPHYSIOLOGY MAPPING AND ABLATION  x2  04-17-2004 [place], [place]);  04-14-2009 by dr allred   per dr allred report-- successful ablation clockwise isthmus-dependent right atrial flutter along the usual cavotricupid isthmus;  complete bidirectionl isthmus block achieved  . CORONARY ARTERY BYPASS GRAFT  08-06-2003  in [place], [place]   x5-- LIMA - LAD,  seqVG to Diagonal, OM, PDA, and PLA  . CYSTOSCOPY WITH RETROGRADE PYELOGRAM, URETEROSCOPY AND STENT PLACEMENT Right 08/10/2016   Procedure: CYSTOSCOPY WITH RETROGRADE PYELOGRAM, URETEROSCOPY AND STENT PLACEMENT;  Surgeon: Alexis Frock, MD;  Location: [facility];  Service: Urology;  Laterality: Right;  . FIXATION RIGHT WRIST Right 2011   stability for arthritis  . INGUINAL HERNIA REPAIR Right 08-08-2015   [facility]- health care at [place]  . ROTATOR CUFF REPAIR Right 2004;  2007  . WISDOM TOOTH EXTRACTION      Family History  Problem Relation Age of Onset  . Heart attack Mother   . Alzheimer's disease Mother   . Cancer Father        throat cancer, chewing tobacco  . Hypertension Neg Hx    Social History:  reports that  he has never smoked. His smokeless tobacco use includes snuff. He reports current alcohol use. He reports that he does not use drugs.  Allergies: No Known Allergies  Medications Prior to Admission  Medication Sig Dispense Refill  . aspirin 81 MG tablet Take 81 mg by mouth at bedtime.     Marland Kitchen atorvastatin (LIPITOR) 40 MG tablet Take 40 mg by mouth at bedtime.     . Multiple Vitamin (MULTIVITAMIN) tablet Take 1 tablet by mouth every morning.     . Omega-3 Fatty Acids (FISH OIL) 1000 MG CAPS Take 1,000 mg by mouth daily.     . Red Yeast Rice 600 MG CAPS Take 600 mg by mouth daily.     . vitamin C (ASCORBIC ACID) 500 MG tablet Take 500 mg by mouth daily.    Marland Kitchen zinc gluconate 50 MG tablet Take 50 mg by mouth daily.       Results for orders placed or performed during the hospital encounter of 02/02/19 (from the past 48 hour(s))  Basic metabolic panel     Status: Abnormal   Collection Time: 02/02/19 11:08 AM  Result Value Ref Range   Sodium 143 135 - 145 mmol/L   Potassium 4.7 3.5 - 5.1 mmol/L   Chloride 109 98 - 111 mmol/L   CO2 27 22 - 32 mmol/L   Glucose, Bld 105 (H) 70 - 99 mg/dL   BUN 20 8 - 23 mg/dL   Creatinine, Ser 0.98 0.61 - 1.24 mg/dL   Calcium 9.1 8.9 - 10.3 mg/dL   GFR calc non Af Amer >60 >60 mL/min   GFR calc Af Amer >60 >60 mL/min   Anion gap 7 5 - 15    Comment: Performed at [facility], [place] [address] [place], [place] [address]  CBC     Status: None   Collection Time: 02/02/19 11:08 AM  Result Value Ref Range   WBC 4.8 4.0 - 10.5 K/uL   RBC 4.47 4.22 - 5.81 MIL/uL   Hemoglobin 13.1 13.0 - 17.0 g/dL   HCT 41.7 39.0 - 52.0 %   MCV 93.3 80.0 - 100.0 fL   MCH 29.3 26.0 - 34.0 pg   MCHC 31.4 30.0 - 36.0 g/dL   RDW 13.4 11.5 - 15.5 %   Platelets 212 150 - 400 K/uL   nRBC 0.0 0.0 - 0.2 %    Comment: Performed at [facility], [place] [address] [place], [place] [address]   No results found.  Review of Systems  Constitutional: Negative for chills and fever.  All other systems reviewed and are negative.   Blood pressure (!) 152/78, pulse 78, temperature 97.9 F (36.6 C), temperature source Oral, resp. rate 16, height 5\' 5"  (1.651 m), weight 74.4 kg, SpO2 100 %. Physical Exam  Constitutional: He appears well-developed.  HENT:  Head: Normocephalic.  Eyes: Pupils are equal, round, and reactive to light.  Neck: Normal range of motion.  Cardiovascular: Normal rate.  GI: Soft.  Genitourinary:    Genitourinary Comments: NO CVAT   Musculoskeletal: Normal range of motion.  Neurological: He is alert.  Skin: Skin is warm.  Psychiatric: He has a normal mood and affect.     Assessment/Plan  Proceed as planned with LEFT partial nephrectomy.  Risks, benefits, alternatives, expected peri-op course discussed previously and reiterated today.   Alexis Frock, MD 02/04/2019, 7:58 AM

## 2019-02-04 NOTE — Anesthesia Procedure Notes (Signed)
Procedure Name: Intubation Date/Time: 02/04/2019 8:30 AM Performed by: Sharlette Dense, CRNA Patient Re-evaluated:Patient Re-evaluated prior to induction Oxygen Delivery Method: Circle system utilized Preoxygenation: Pre-oxygenation with 100% oxygen Induction Type: IV induction Ventilation: Mask ventilation without difficulty and Oral airway inserted - appropriate to patient size Laryngoscope Size: Miller and 3 Grade View: Grade I Tube type: Oral Tube size: 8.0 mm Number of attempts: 1 Airway Equipment and Method: Stylet Placement Confirmation: ETT inserted through vocal cords under direct vision,  positive ETCO2 and breath sounds checked- equal and bilateral Secured at: 22 cm Tube secured with: Tape Dental Injury: Teeth and Oropharynx as per pre-operative assessment

## 2019-02-04 NOTE — Anesthesia Postprocedure Evaluation (Signed)
Anesthesia Post Note  Patient: Donald [name]  Procedure(s) Performed: XI ROBOTIC ASSITED LEFT PARTIAL NEPHRECTOMY; LEFT PARTIAL ADRENALECTOMY (Left )     Patient location during evaluation: PACU Anesthesia Type: General Level of consciousness: awake Pain management: pain level controlled Vital Signs Assessment: post-procedure vital signs reviewed and stable Respiratory status: spontaneous breathing Cardiovascular status: stable Postop Assessment: no apparent nausea or vomiting Anesthetic complications: no    Last Vitals:  Vitals:   02/04/19 1130 02/04/19 1159  BP: 132/65 131/75  Pulse: 81 82  Resp: 13 18  Temp: (!) 36.4 C 36.4 C  SpO2: 98% 99%    Last Pain:  Vitals:   02/04/19 1159  TempSrc: Oral  PainSc:    Pain Goal:                   Huston Foley

## 2019-02-04 NOTE — Progress Notes (Signed)
Patient ID: Donald [name], male   DOB: 1946/01/07, 73 y.o.   MRN: [id number]  Post-op note  Subjective: The patient is doing well.  No complaints.  Pain well controlled.  No N/V.  Objective: Vital signs in last 24 hours: Temp:  [97.5 F (36.4 C)-97.9 F (36.6 C)] 97.6 F (36.4 C) (12/02 1159) Pulse Rate:  [78-92] 82 (12/02 1159) Resp:  [10-20] 18 (12/02 1159) BP: (124-152)/(65-80) 131/75 (12/02 1159) SpO2:  [98 %-100 %] 99 % (12/02 1159) Weight:  [74.4 kg] 74.4 kg (12/02 0643)  Intake/Output from previous day: No intake/output data recorded. Intake/Output this shift: Total I/O In: 1300 [I.V.:1200; IV Piggyback:100] Out: 511 [Urine:450; Drains:11; Blood:50]  Physical Exam:  General: Alert and oriented. Abdomen: Soft, Nondistended. Incisions: Clean and dry. Urine: clear   Lab Results: Recent Labs    02/02/19 1108 02/04/19 1114  HGB 13.1 12.1*  HCT 41.7 37.9*    Assessment/Plan: POD#0   1) Continue to monitor  2) DVT prophy, clears, IS, bed rest tonight, pain control   LOS: 0 days   Debbrah Alar 02/04/2019, 3:42 PM

## 2019-02-05 ENCOUNTER — Encounter (HOSPITAL_COMMUNITY): Payer: Self-pay | Admitting: Urology

## 2019-02-05 DIAGNOSIS — C642 Malignant neoplasm of left kidney, except renal pelvis: Secondary | ICD-10-CM | POA: Diagnosis not present

## 2019-02-05 LAB — BASIC METABOLIC PANEL
Anion gap: 9 (ref 5–15)
BUN: 18 mg/dL (ref 8–23)
CO2: 24 mmol/L (ref 22–32)
Calcium: 8.6 mg/dL — ABNORMAL LOW (ref 8.9–10.3)
Chloride: 103 mmol/L (ref 98–111)
Creatinine, Ser: 0.93 mg/dL (ref 0.61–1.24)
GFR calc Af Amer: >60 mL/min (ref >60)
GFR calc non Af Amer: >60 mL/min (ref >60)
Glucose, Bld: 142 mg/dL — ABNORMAL HIGH (ref 70–99)
Potassium: 3.9 mmol/L (ref 3.5–5.1)
Sodium: 136 mmol/L (ref 135–145)

## 2019-02-05 LAB — SURGICAL PATHOLOGY

## 2019-02-05 LAB — HEMOGLOBIN AND HEMATOCRIT, BLOOD
HCT: 34.6 % — ABNORMAL LOW (ref 39.0–52.0)
Hemoglobin: 11 g/dL — ABNORMAL LOW (ref 13.0–17.0)

## 2019-02-05 MED ORDER — ACETAMINOPHEN 500 MG PO TABS
1000.0000 mg | ORAL_TABLET | Freq: Three times a day (TID) | ORAL | Status: DC
  Administered 2019-02-05: 1000 mg via ORAL
  Filled 2019-02-05: qty 2

## 2019-02-05 NOTE — Op Note (Signed)
NAME: SCHAEFER, OSBURN MEDICAL RECORD [id number] ACCOUNT [id number] DATE OF BIRTH:1945-10-16 FACILITY: [facility] LOCATION: [facility] PHYSICIAN:Everlene Cunning, MD  OPERATIVE REPORT  DATE OF PROCEDURE:  02/04/2019  PREOPERATIVE DIAGNOSES:   1.  Enlarging left renal mass. 2.  Small left adrenal nodule.  PROCEDURE: 1.  Robotic-assisted laparoscopic left partial nephrectomy. 2.  Left partial adrenalectomy.  SURGEON:  Alexis Frock, MD  ESTIMATED BLOOD LOSS:  50 mL.  COMPLICATIONS:  None.  SPECIMENS:   1.  Left renal mass for pathology. 2.  Small adrenal nodule, left, for pathology.  FINDINGS: 1.  Single artery, single vein, left renal vascular anatomy, prominent lumbar vein. 2.  Predominantly exophytic left upper pole renal mass. 3.  Small left renal mass.  SURGEON:  Alexis Frock, MD  ASSISTANT:  Debbrah Alar, PA  INDICATIONS:  The patient is a very pleasant and quite vigorous 73 year old man who was found to have a small upper pole incidental renal mass.  He was a suitable candidate for surveillance.  He has been on surveillance for several years ago.  He  unfortunately has had progression of his mass from approximately 1 cm now to over 2.5.  This is solid and enhancing, worrisome for nonindolent malignanct.  Options were discussed including continued surveillance versus ablative therapies  versus surgical extirpation and to proceed with left partial nephrectomy with curative intent.  He also has a small ipsilateral left adrenal nodule as well.   DESCRIPTION OF PROCEDURE:  The patient being the patient, the procedure being left partial nephrectomy was confirmed.  Procedure timeout was performed.  Intravenous antibiotics administered.  General endotracheal anesthesia induced.  The patient was  placed into a left side up, full flank position, pulling 15 degrees of table flexion and superior arm elevator, axillary roll, sequential devices, bottom leg bent, top leg straight.  He was  further fastened to the table using 3-inch tape over foam  padding across the supraxiphoid chest and his pelvis.  A beanbag and superior elevator were used.  All bony prominences were padded with foam.  A sterile field was created, prepped and draped the patient's left flank and abdomen using chlorhexidine  gluconate.  A Foley catheter was placed to straight drain.  Next, low-pressure pressure, high-flow pneumoperitoneum was obtained using Veress technique in the left lower quadrant, having passed the aspiration and drop test.  An 8 mm robotic camera port  was then placed in position approximately 4 fingerbreadths superolateral to the umbilicus.  Laparoscopic examination peritoneal cavity revealed no significant adhesions, no visceral injury.  Distal ports were then placed as follows.  Left subcostal 8 mm  robotic port, left far lateral 8 mm robotic port approximately 3 fingerbreadths superomedial to the anterior iliac spine, left paramedian inferior robotic port approximately 1 handbreadth superior to  pubic ramus and two 12 mm assistant port sites at  midline, one in the supraumbilical crease and another in a plane approximately 2 fingerbreadths superior to the camera port.  Robot was docked and passed the electronic checks.  Initial attention was directed at developing the retroperitoneum.  Incision  was made lateral to the descending colon from the splenic flexure towards the internal ring and the colon was carefully swept medially.  Lateral splenic attachments were taken down, allowing the spleen to rotate medially and self-retract.  The lower pole  of the kidney was identified, placed on gentle lateral traction.  Dissection proceeded medial to this.  The left ureter and gonadal vessels were encountered and placed on gentle lateral  traction.  Dissection proceeded towards the renal hilum and the  triangle of the gonadal vessels, ureter, psoas musculature.  Renal hilum consisted of a single artery, single  vein.  Left renal vascular anatomy was as anticipated. There was a very prominent lumbar vein.  There was a suitable window to the artery trunk  on the medial side of the lumbar vessel and the left renal artery trunk was mobilized circumferentially, marked with a vessel loop.  The patient does have a small left adrenal nodule as well.  This was noted easily as well.  The primary goal of surgery  today being left partial nephrectomy, the left upper pole kidney area was defatted to expose the left renal mass which was quite vascular with several small parasitic vessels around it.  Purposely a wide dissection was made to keep a rim of fat above and  surrounding the small mass.  This did appear amenable to partial nephrectomy.  Warm ischemia was achieved by placing 2 bulldog clamps on the artery and partial nephrectomy was performed using cold scissors, keeping what appeared to be a rim of normal  parenchyma with partial nephrectomy specimen.  Several small bleeders were controlled using coagulation current.  First layer renorrhaphy was performed using running 3-0 V-Loc suture, oversewing several small venous sinuses.  There was no obvious entry  into collecting system.  Second layer renorrhaphy was performed now, placing a Surgicel bolster onto the partial nephrectomy area and then 2 cortical reapproximation sutures of 0 Vicryl sandwiched between Hem-o-loks and Lapra-Tys.  Warm ischemia was then  removed for a total warm ischemia time of 10 minutes.  Hemostasis appeared excellent.  Sponge, needle counts were correct.  Retroperitoneal fat was then brought back over the area of renorrhaphy using Vicryl suture.  Hemostasis was excellent.  Sponge  and  needle counts went well.  The left renal nodule area was once again inspected.  This also appeared to be amenable to resection, especially given the vascular nature of the small upper pole mass and the possibility that this could represent solitary  metastasis.  As  such, a partial adrenalectomy was performed circumferentially by manipulating the small mass, keeping what appeared to be a rim of normal adrenal tissue around this.  Hemostasis was achieved using bipolar energy robotically.  Then the  small mass was set aside and labeled as left renal nodule and the resection bed of this was also hemostatic.  A closed suction drain was brought through the left lateral most robotic port site near the peritoneal cavity.  Specimen was retrieved by  extending the previous inferior assistant port site for a distance of approximately 2 cm, removing the partial nephrectomy specimen and setting aside for permanent pathology.  The superior assistant port site was closed at the fascia using 0 Vicryl.  The  extraction site closed at the fascia using figure-of-eight PDS x2.  All incision sites were infiltrated with dilute lipolyzed Marcaine and closed at level of skin using subcuticular Monocryl and Dermabond.  Procedure terminated.  The patient tolerated  the procedure well.  No immediate perioperative complications.  The patient went to postanesthesia care in stable condition, with plan for admission.  VN/NUANCE  D:02/04/2019 T:02/04/2019 JOB:[id number]

## 2019-02-05 NOTE — Discharge Summary (Signed)
Physician Discharge Summary  Patient ID: Donald [name] MRN: [id number] DOB/AGE: November 17, 1945 73 y.o.  Admit date: 02/04/2019 Discharge date: 02/05/2019  Admission Diagnoses: Left Renal Mass  Discharge Diagnoses:  Active Problems:   Renal mass   Discharged Condition: good  Hospital Course: Pt underwent LEFT robotic partial nephrectomy on 02/04/19, the day of admission, without acute complication. He was admitted to 4th floor Urology service post-op where he began his recovery. By the afternoon of POD 1 he is ambulatory, voiding w/o foley, tollerating PO  Nutrition, pain controlled on PO meds, JP removed as output scant, and felt to be adequate for discharge. Hgb 11, Cr 0.93, surgical pathology pending at discharge.   Consults: None  Significant Diagnostic Studies: labs: as per above  Treatments: surgery: as per above  Discharge Exam: Blood pressure 108/60, pulse 65, temperature 98.6 F (37 C), temperature source Oral, resp. rate 16, height 5\' 5"  (1.651 m), weight 74.4 kg, SpO2 99 %. General appearance: alert, cooperative, appears stated age and wife at bedside Eyes: negative Nose: Nares normal. Septum midline. Mucosa normal. No drainage or sinus tenderness. Throat: lips, mucosa, and tongue normal; teeth and gums normal Neck: supple, symmetrical, trachea midline Back: symmetric, no curvature. ROM normal. No CVA tenderness. Resp: non-labored on room air.  Cardio: Nl rate GI: soft, non-tender; bowel sounds normal; no masses,  no organomegaly Male genitalia: normal Extremities: extremities normal, atraumatic, no cyanosis or edema Pulses: 2+ and symmetric Skin: Skin color, texture, turgor normal. No rashes or lesions Lymph nodes: Cervical, supraclavicular, and axillary nodes normal. Neurologic: Grossly normal  Recent port and extraction sites c/d/i. Prior JP site with dry dressing.   Disposition: HOME   Allergies as of 02/05/2019   No Known Allergies     Medication List     STOP taking these medications   aspirin 81 MG tablet   Fish Oil 1000 MG Caps   multivitamin tablet   Red Yeast Rice 600 MG Caps   vitamin C 500 MG tablet Commonly known as: ASCORBIC ACID   zinc gluconate 50 MG tablet     TAKE these medications   atorvastatin 40 MG tablet Commonly known as: LIPITOR Take 40 mg by mouth at bedtime.   HYDROcodone-acetaminophen 5-325 MG tablet Commonly known as: Norco Take 1-2 tablets by mouth every 6 (six) hours as needed for moderate pain.      Follow-up Information    Alexis Frock, MD On 02/17/2019.   Specialty: Urology Why: at 9:45 AM for MD visit.  Contact information: [place] [address] [phone]           Signed: Alexis Frock 02/05/2019, 2:08 PM

## 2019-02-05 NOTE — Progress Notes (Signed)
Patient given discharge, follow up, and medication instructions, verbalized understanding, IV removed, personal belongings with patient, family to transport home  

## 2019-02-17 DIAGNOSIS — C642 Malignant neoplasm of left kidney, except renal pelvis: Secondary | ICD-10-CM | POA: Diagnosis not present

## 2019-04-30 ENCOUNTER — Ambulatory Visit: Payer: PPO | Attending: Internal Medicine

## 2019-04-30 DIAGNOSIS — Z23 Encounter for immunization: Secondary | ICD-10-CM | POA: Insufficient documentation

## 2019-04-30 NOTE — Progress Notes (Signed)
   Covid-19 Vaccination Clinic  Name:  Donald [name]    MRN: [id number] DOB: 1945/12/26  04/30/2019  Donald [name] was observed post Covid-19 immunization for 15 minutes without incidence. He was provided with Vaccine Information Sheet and instruction to access the V-Safe system.   Donald [name] was instructed to call 911 with any severe reactions post vaccine: Marland Kitchen Difficulty breathing  . Swelling of your face and throat  . A fast heartbeat  . A bad rash all over your body  . Dizziness and weakness    Immunizations Administered    Name Date Dose VIS Date Route   Pfizer COVID-19 Vaccine 04/30/2019 12:07 PM 0.3 mL 02/13/2019 Intramuscular   Manufacturer: [place]   Lot: [id number]   [place]: [id number]

## 2019-05-20 ENCOUNTER — Ambulatory Visit: Payer: PPO | Attending: Internal Medicine

## 2019-05-20 DIAGNOSIS — Z23 Encounter for immunization: Secondary | ICD-10-CM

## 2019-05-20 NOTE — Progress Notes (Signed)
   Covid-19 Vaccination Clinic  Name:  Donald [name]    MRN: [id number] DOB: 12-15-1945  05/20/2019  [name] was observed post Covid-19 immunization for 15 minutes without incident. He was provided with Vaccine Information Sheet and instruction to access the V-Safe system.   [name] was instructed to call 911 with any severe reactions post vaccine: Marland Kitchen Difficulty breathing  . Swelling of face and throat  . A fast heartbeat  . A bad rash all over body  . Dizziness and weakness   Immunizations Administered    Name Date Dose VIS Date Route   Pfizer COVID-19 Vaccine 05/20/2019  1:46 PM 0.3 mL 02/13/2019 Intramuscular   Manufacturer: [place]   Lot: [id number]   [place]: [id number]

## 2019-06-11 DIAGNOSIS — M25562 Pain in left knee: Secondary | ICD-10-CM | POA: Diagnosis not present

## 2019-06-11 DIAGNOSIS — M1712 Unilateral primary osteoarthritis, left knee: Secondary | ICD-10-CM | POA: Diagnosis not present

## 2019-06-22 ENCOUNTER — Ambulatory Visit (INDEPENDENT_AMBULATORY_CARE_PROVIDER_SITE_OTHER): Payer: PPO | Admitting: Family Medicine

## 2019-06-22 ENCOUNTER — Encounter: Payer: Self-pay | Admitting: Family Medicine

## 2019-06-22 ENCOUNTER — Other Ambulatory Visit: Payer: Self-pay

## 2019-06-22 VITALS — BP 136/74 | HR 69 | Temp 97.9°F | Resp 17 | Ht 65.0 in | Wt 161.4 lb

## 2019-06-22 DIAGNOSIS — S80869A Insect bite (nonvenomous), unspecified lower leg, initial encounter: Secondary | ICD-10-CM

## 2019-06-22 DIAGNOSIS — M79671 Pain in right foot: Secondary | ICD-10-CM

## 2019-06-22 DIAGNOSIS — D4112 Neoplasm of uncertain behavior of left renal pelvis: Secondary | ICD-10-CM

## 2019-06-22 DIAGNOSIS — W57XXXA Bitten or stung by nonvenomous insect and other nonvenomous arthropods, initial encounter: Secondary | ICD-10-CM | POA: Diagnosis not present

## 2019-06-22 DIAGNOSIS — C642 Malignant neoplasm of left kidney, except renal pelvis: Secondary | ICD-10-CM

## 2019-06-22 NOTE — Patient Instructions (Addendum)
Uric acid collected today. If elevated > could consider starting a daily  medication for gout.  If levels normal> would have you work on diet changes and HYDRATE. Return during a flare so we can test then.   Low-Purine Eating Plan A low-purine eating plan involves making food choices to limit your intake of purine. Purine is a kind of uric acid. Too much uric acid in your blood can cause certain conditions, such as gout and kidney stones. Eating a low-purine diet can help control these conditions. What are tips for following this plan? Reading food labels   Avoid foods with saturated or Trans fat.  Check the ingredient list of grains-based foods, such as bread and cereal, to make sure that they contain whole grains.  Check the ingredient list of sauces or soups to make sure they do not contain meat or fish.  When choosing soft drinks, check the ingredient list to make sure they do not contain high-fructose corn syrup. Shopping  Buy plenty of fresh fruits and vegetables.  Avoid buying canned or fresh fish.  Buy dairy products labeled as low-fat or nonfat.  Avoid buying premade or processed foods. These foods are often high in fat, salt (sodium), and added sugar. Cooking  Use olive oil instead of butter when cooking. Oils like olive oil, canola oil, and sunflower oil contain healthy fats. Meal planning  Learn which foods do or do not affect you. If you find out that a food tends to cause your gout symptoms to flare up, avoid eating that food. You can enjoy foods that do not cause problems. If you have any questions about a food item, talk with your dietitian or health care provider.  Limit foods high in fat, especially saturated fat. Fat makes it harder for your body to get rid of uric acid.  Choose foods that are lower in fat and are lean sources of protein. General guidelines  Limit alcohol intake to no more than 1 drink a day for nonpregnant women and 2 drinks a day for men. One  drink equals 12 oz of beer, 5 oz of wine, or 1 oz of hard liquor. Alcohol can affect the way your body gets rid of uric acid.  Drink plenty of water to keep your urine clear or pale yellow. Fluids can help remove uric acid from your body.  If directed by your health care provider, take a vitamin C supplement.  Work with your health care provider and dietitian to develop a plan to achieve or maintain a healthy weight. Losing weight can help reduce uric acid in your blood. What foods are recommended? The items listed may not be a complete list. Talk with your dietitian about what dietary choices are best for you. Foods low in purines Foods low in purines do not need to be limited. These include:  All fruits.  All low-purine vegetables, pickles, and olives.  Breads, pasta, rice, cornbread, and popcorn. Cake and other baked goods.  All dairy foods.  Eggs, nuts, and nut butters.  Spices and condiments, such as salt, herbs, and vinegar.  Plant oils, butter, and margarine.  Water, sugar-free soft drinks, tea, coffee, and cocoa.  Vegetable-based soups, broths, sauces, and gravies. Foods moderate in purines Foods moderate in purines should be limited to the amounts listed.   cup of asparagus, cauliflower, spinach, mushrooms, or green peas, each day.  2/3 cup uncooked oatmeal, each day.   cup dry wheat bran or wheat germ, each day.  2-3  ounces of meat or poultry, each day.  4-6 ounces of shellfish, such as crab, lobster, oysters, or shrimp, each day.  1 cup cooked beans, peas, or lentils, each day.  Soup, broths, or bouillon made from meat or fish. Limit these foods as much as possible. What foods are not recommended? The items listed may not be a complete list. Talk with your dietitian about what dietary choices are best for you. Limit your intake of foods high in purines, including:  Beer and other alcohol.  Meat-based gravy or sauce.  Canned or fresh fish, such  as: ? Anchovies, sardines, herring, and tuna. ? Mussels and scallops. ? Codfish, trout, and haddock.  Berniece Salines.  Organ meats, such as: ? Liver or kidney. ? Tripe. ? Sweetbreads (thymus gland or pancreas).  Wild Clinical biochemist.  Yeast or yeast extract supplements.  Drinks sweetened with high-fructose corn syrup. Summary  Eating a low-purine diet can help control conditions caused by too much uric acid in the body, such as gout or kidney stones.  Choose low-purine foods, limit alcohol, and limit foods high in fat.  You will learn over time which foods do or do not affect you. If you find out that a food tends to cause your gout symptoms to flare up, avoid eating that food. This information is not intended to replace advice given to you by your health care provider. Make sure you discuss any questions you have with your health care provider. Document Revised: 02/01/2017 Document Reviewed: 04/04/2016 Elsevier Patient Education  2020 [employer].

## 2019-06-22 NOTE — Progress Notes (Signed)
This visit occurred during the SARS-CoV-2 public health emergency.  Safety protocols were in place, including screening questions prior to the visit, additional usage of staff PPE, and extensive cleaning of exam room while observing appropriate contact time as indicated for disinfecting solutions.    Donald [name] , Nov 06, 1945, 74 y.o., male MRN: [id number] Patient Care Team    Relationship Specialty Notifications Start End  Ma Hillock, DO PCP - General Family Medicine  12/11/18   Jettie Booze, MD PCP - Cardiology Cardiology Admissions 11/25/18   Thompson Grayer, MD PCP - Electrophysiology Cardiology Admissions 11/25/18   Ulyses Southward., MD Consulting Physician Urology  09/09/14   Jettie Booze, MD Consulting Physician Cardiology  09/09/14   Alexis Frock, MD Consulting Physician Urology  01/12/19   Jarome Matin, MD Consulting Physician Dermatology  01/12/19     Chief Complaint  Patient presents with  . Foot Pain    Pt has some "bites" on bilateral lower legs with with swelling and pain on right foot. Happens off and on and will last up to two weeks.      Subjective: Pt presents for an OV with complaints of right foot swelling.  Patient reports he has noticed right foot swelling every few months for the last 6 months.  He states the swelling, redness and pain will last about 10 days and then resolve.  He reports he never had a history of gout prior.  When swelling and tenderness occurs he ices and uses ibuprofen.  He reports that he had a recent flare a little over a week ago, however his symptoms are mostly resolved at this time.  Patient does not feel he has changed his diet in any way.  He has recently had a left partial nephrectomy. Patient also has 3 insect bites on his lower legs that he would like evaluated today.  He reports they are mildly itchy.  He has been putting Benadryl cream over the area.  He denies any bleeding or drainage.  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 Allergic rhinitis   . BPH (benign prostatic hyperplasia)   . Chicken pox   . Coronary artery disease    a. MI 2003 with CABG.  . Hematuria   . History of basal cell carcinoma (BCC) excision    left upper back, right chest area  . History of colon polyps   . History of MI (myocardial infarction) 08/04/2001   s/p  cabg  . Hydronephrosis, right   . Hypertension   . HYPERTENSION, MILD 05/18/2009   Qualifier: Diagnosis of  By: Rayann Heman, MD, Jeneen Rinks    . Incomplete right bundle branch block (RBBB)   . 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  Osteoarthritis of knee 05/13/2017  . Renal cell carcinoma ([place]) 02/04/2019   Partial nephrectomyKIDNEY, LEFT, PARTIAL NEPHRECTOMY:   . Right ureteral stone   . S/P ablation of atrial flutter    followed by dr Rayann Heman (EP cardiolgoist)  ablation 04-17-2004  and by dr allred 02-10- 2011 was successful  . S/P CABG x 5 08-05-2001   in [place] , [place]   LIMA to LAD,  seqVG to Diagonal,  SeqVG to OM,  seqVG to PDA and PLA  . Seasonal allergies   . Sinus bradycardia   . Wears glasses    Past Surgical History:  Procedure Laterality Date  . CARDIAC CATHETERIZATION  08-04-2001  in [place], [place]   severe 3 vessel cad  . CARDIAC ELECTROPHYSIOLOGY MAPPING AND ABLATION  x2  04-17-2004 [place], [place]);  04-14-2009 by dr allred   per dr allred report-- successful ablation clockwise isthmus-dependent right atrial flutter along the usual  cavotricupid isthmus;  complete bidirectionl isthmus block achieved  . CORONARY ARTERY BYPASS GRAFT  08-06-2003  in [place], [place]   x5-- LIMA - LAD,  seqVG to Diagonal, OM, PDA, and PLA  . CYSTOSCOPY WITH RETROGRADE PYELOGRAM, URETEROSCOPY AND STENT PLACEMENT Right 08/10/2016   Procedure: CYSTOSCOPY WITH RETROGRADE PYELOGRAM, URETEROSCOPY AND STENT PLACEMENT;  Surgeon: Alexis Frock, MD;  Location: [facility];  Service: Urology;  Laterality: Right;  . FIXATION RIGHT WRIST Right 2011   stability for arthritis  . INGUINAL HERNIA REPAIR Right 08-08-2015   [facility]- health care at [place]  . ROBOTIC ASSITED PARTIAL NEPHRECTOMY Left 02/04/2019   Procedure: XI ROBOTIC ASSITED LEFT PARTIAL NEPHRECTOMY; LEFT PARTIAL ADRENALECTOMY;  Surgeon: Alexis Frock, MD;  Location: [facility];  Service: Urology;  Laterality: Left;  3 HRS  . ROTATOR CUFF REPAIR Right 2004;  2007  . WISDOM TOOTH EXTRACTION     Family History  Problem Relation Age of Onset  . Heart attack Mother   . Alzheimer's disease Mother   . Cancer Father        throat cancer, chewing tobacco  . Hypertension Neg Hx    Allergies as of 06/22/2019   No Known Allergies     Medication List       Accurate as of June 22, 2019 11:59 PM. If you have any questions, ask your nurse or doctor.        STOP taking these medications   HYDROcodone-acetaminophen 5-325 MG tablet Commonly known as: Norco Stopped by: Howard Pouch, DO     TAKE these medications   atorvastatin 40 MG tablet Commonly known as: LIPITOR Take 40 mg by mouth at bedtime.   Fish Oil 1000 MG Caps Take by mouth.   MULTIVITAMIN ADULT PO Take by mouth.   Red Yeast Rice 600 MG Caps Take by mouth.       All past medical history, surgical history, allergies, family history, immunizations andmedications were updated in the EMR today and reviewed under the history and medication portions of their EMR.     ROS: Negative, with the exception of  above mentioned in HPI   Objective:  BP 136/74 (BP Location: Right Arm, Patient Position: Sitting, Cuff Size: Normal)   Pulse 69   Temp 97.9 F (36.6 C) (Temporal)   Resp 17   Ht 5' 5"  (1.651 m)   Wt 161 lb 6 oz (73.2 kg)   SpO2 98%   BMI 26.85 kg/m  Body mass index is 26.85 kg/m. Gen: Afebrile. No acute distress. Nontoxic in  appearance, well developed, well nourished.  HENT: AT. [place].  Eyes:Pupils Equal Round Reactive to light, Extraocular movements intact,  Conjunctiva without redness, discharge or icterus. CV: RRR Chest: CTAB, no wheeze or crackles.  MSK: Bilateral lower extremities without erythema, swelling or tenderness.  Neurovascularly intact distally. Skin: no rashes, purpura or petechiae. x3 insect bites lower extremities, no drainage or bleeding. Neuro: Normal gait. PERLA. EOMi. Alert. Oriented x3   No exam data present No results found. No results found for this or any previous visit (from the past 24 hour(s)).  Assessment/Plan: DORIEN MAYOTTE is a 74 y.o. male present for OV for  Right foot pain Per patient's report of symptoms it seems consistent this is likely gout flares.  Improves with time and over-the-counter NSAIDs.  He is currently asymptomatic.  We will collect a uric acid level since he had a flare a little over a week ago.  If uric acid is normal would need him to return during a flare for evaluation and uric acid level at the time of flare. -If uric acid is elevated still today, could consider adding allopurinol.  Would need to collect a BMP for current kidney function/dosing since he has recently had a renal cell carcinoma and left partial nephrectomy without any noted repeat kidney function. - Uric acid Low purine diet was discussed with patient today and he was provided with written instructions. Follow-up dependent upon laboratory results.  Insect bites: Insect bites of lower legs look consistent with mosquito bites.  Can continue Benadryl cream and  monitor for any infectious signs.  Return for evaluation if redness, swelling or drainage occur.  Renal cell carcinoma of left kidney (HCC) Will obtain CMP to note kidney function since renal cell carcinoma status post left partial nephrectomy. - Comp Met (CMET)   Reviewed expectations re: course of current medical issues.  Discussed self-management of symptoms.  Outlined signs and symptoms indicating need for more acute intervention.  Patient verbalized understanding and all questions were answered.  Patient received an After-Visit Summary.    Orders Placed This Encounter  Procedures  . Uric acid  . Comp Met (CMET)   No orders of the defined types were placed in this encounter.  Referral Orders  No referral(s) requested today     Note is dictated utilizing voice recognition software. Although note has been proof read prior to signing, occasional typographical errors still can be missed. If any questions arise, please do not hesitate to call for verification.   electronically signed by:  Howard Pouch, DO  [place]

## 2019-06-23 ENCOUNTER — Telehealth: Payer: Self-pay | Admitting: Family Medicine

## 2019-06-23 LAB — COMPREHENSIVE METABOLIC PANEL
ALT: 14 U/L (ref 0–53)
AST: 18 U/L (ref 0–37)
Albumin: 4.1 g/dL (ref 3.5–5.2)
Alkaline Phosphatase: 79 U/L (ref 39–117)
BUN: 20 mg/dL (ref 6–23)
CO2: 29 mEq/L (ref 19–32)
Calcium: 9.1 mg/dL (ref 8.4–10.5)
Chloride: 105 mEq/L (ref 96–112)
Creatinine, Ser: 0.95 mg/dL (ref 0.40–1.50)
GFR: 77.09 mL/min (ref >60.00)
Glucose, Bld: 102 mg/dL — ABNORMAL HIGH (ref 70–99)
Potassium: 4.1 mEq/L (ref 3.5–5.1)
Sodium: 141 mEq/L (ref 135–145)
Total Bilirubin: 0.5 mg/dL (ref 0.2–1.2)
Total Protein: 6.6 g/dL (ref 6.0–8.3)

## 2019-06-23 LAB — URIC ACID: Uric Acid, Serum: 6.1 mg/dL (ref 4.0–7.8)

## 2019-06-23 NOTE — Telephone Encounter (Signed)
Spoke w/ Pt- informed of results and recommendations. Pt verbalized understanding.  

## 2019-06-23 NOTE — Telephone Encounter (Signed)
Please call patient: His uric acid levels are normal.  Therefore cannot for certain say it was gout causing his symptoms.  Therefore I recommend he remain hydrated work on the dietary changes we discussed and if he has another flare he should be seen during flare so that uric acid and exam can be completed.

## 2019-06-24 ENCOUNTER — Encounter: Payer: Self-pay | Admitting: Family Medicine

## 2019-06-26 ENCOUNTER — Encounter: Payer: Self-pay | Admitting: Family Medicine

## 2019-08-07 ENCOUNTER — Other Ambulatory Visit (HOSPITAL_COMMUNITY): Payer: Self-pay | Admitting: Urology

## 2019-08-07 ENCOUNTER — Other Ambulatory Visit: Payer: Self-pay

## 2019-08-07 ENCOUNTER — Ambulatory Visit (HOSPITAL_COMMUNITY)
Admission: RE | Admit: 2019-08-07 | Discharge: 2019-08-07 | Disposition: A | Discharge status: Home / Self Care | Payer: PPO | Source: Ambulatory Visit | Attending: Urology | Admitting: Urology

## 2019-08-07 DIAGNOSIS — Z85528 Personal history of other malignant neoplasm of kidney: Secondary | ICD-10-CM | POA: Diagnosis not present

## 2019-08-07 DIAGNOSIS — C642 Malignant neoplasm of left kidney, except renal pelvis: Secondary | ICD-10-CM

## 2019-08-10 DIAGNOSIS — C642 Malignant neoplasm of left kidney, except renal pelvis: Secondary | ICD-10-CM | POA: Diagnosis not present

## 2019-08-13 DIAGNOSIS — C649 Malignant neoplasm of unspecified kidney, except renal pelvis: Secondary | ICD-10-CM | POA: Diagnosis not present

## 2019-08-13 DIAGNOSIS — N200 Calculus of kidney: Secondary | ICD-10-CM | POA: Diagnosis not present

## 2019-08-13 DIAGNOSIS — C642 Malignant neoplasm of left kidney, except renal pelvis: Secondary | ICD-10-CM | POA: Diagnosis not present

## 2019-08-18 DIAGNOSIS — C642 Malignant neoplasm of left kidney, except renal pelvis: Secondary | ICD-10-CM | POA: Diagnosis not present

## 2019-08-18 DIAGNOSIS — N202 Calculus of kidney with calculus of ureter: Secondary | ICD-10-CM | POA: Diagnosis not present

## 2019-09-14 DIAGNOSIS — M9903 Segmental and somatic dysfunction of lumbar region: Secondary | ICD-10-CM | POA: Diagnosis not present

## 2019-09-14 DIAGNOSIS — M4722 Other spondylosis with radiculopathy, cervical region: Secondary | ICD-10-CM | POA: Diagnosis not present

## 2019-09-14 DIAGNOSIS — M9904 Segmental and somatic dysfunction of sacral region: Secondary | ICD-10-CM | POA: Diagnosis not present

## 2019-09-14 DIAGNOSIS — M5033 Other cervical disc degeneration, cervicothoracic region: Secondary | ICD-10-CM | POA: Diagnosis not present

## 2019-09-14 DIAGNOSIS — M5137 Other intervertebral disc degeneration, lumbosacral region: Secondary | ICD-10-CM | POA: Diagnosis not present

## 2019-09-14 DIAGNOSIS — M4186 Other forms of scoliosis, lumbar region: Secondary | ICD-10-CM | POA: Diagnosis not present

## 2019-09-14 DIAGNOSIS — R293 Abnormal posture: Secondary | ICD-10-CM | POA: Diagnosis not present

## 2019-09-14 DIAGNOSIS — M9902 Segmental and somatic dysfunction of thoracic region: Secondary | ICD-10-CM | POA: Diagnosis not present

## 2019-09-14 DIAGNOSIS — M9901 Segmental and somatic dysfunction of cervical region: Secondary | ICD-10-CM | POA: Diagnosis not present

## 2019-09-17 DIAGNOSIS — R293 Abnormal posture: Secondary | ICD-10-CM | POA: Diagnosis not present

## 2019-09-17 DIAGNOSIS — M5033 Other cervical disc degeneration, cervicothoracic region: Secondary | ICD-10-CM | POA: Diagnosis not present

## 2019-09-17 DIAGNOSIS — M9903 Segmental and somatic dysfunction of lumbar region: Secondary | ICD-10-CM | POA: Diagnosis not present

## 2019-09-17 DIAGNOSIS — M5137 Other intervertebral disc degeneration, lumbosacral region: Secondary | ICD-10-CM | POA: Diagnosis not present

## 2019-09-17 DIAGNOSIS — M4186 Other forms of scoliosis, lumbar region: Secondary | ICD-10-CM | POA: Diagnosis not present

## 2019-09-17 DIAGNOSIS — M9904 Segmental and somatic dysfunction of sacral region: Secondary | ICD-10-CM | POA: Diagnosis not present

## 2019-09-17 DIAGNOSIS — M9902 Segmental and somatic dysfunction of thoracic region: Secondary | ICD-10-CM | POA: Diagnosis not present

## 2019-09-17 DIAGNOSIS — M4722 Other spondylosis with radiculopathy, cervical region: Secondary | ICD-10-CM | POA: Diagnosis not present

## 2019-09-17 DIAGNOSIS — M9901 Segmental and somatic dysfunction of cervical region: Secondary | ICD-10-CM | POA: Diagnosis not present

## 2019-09-22 DIAGNOSIS — M4186 Other forms of scoliosis, lumbar region: Secondary | ICD-10-CM | POA: Diagnosis not present

## 2019-09-22 DIAGNOSIS — M4722 Other spondylosis with radiculopathy, cervical region: Secondary | ICD-10-CM | POA: Diagnosis not present

## 2019-09-22 DIAGNOSIS — R293 Abnormal posture: Secondary | ICD-10-CM | POA: Diagnosis not present

## 2019-09-22 DIAGNOSIS — M9902 Segmental and somatic dysfunction of thoracic region: Secondary | ICD-10-CM | POA: Diagnosis not present

## 2019-09-22 DIAGNOSIS — M5033 Other cervical disc degeneration, cervicothoracic region: Secondary | ICD-10-CM | POA: Diagnosis not present

## 2019-09-22 DIAGNOSIS — M5137 Other intervertebral disc degeneration, lumbosacral region: Secondary | ICD-10-CM | POA: Diagnosis not present

## 2019-09-22 DIAGNOSIS — M9904 Segmental and somatic dysfunction of sacral region: Secondary | ICD-10-CM | POA: Diagnosis not present

## 2019-09-22 DIAGNOSIS — M9901 Segmental and somatic dysfunction of cervical region: Secondary | ICD-10-CM | POA: Diagnosis not present

## 2019-09-22 DIAGNOSIS — M9903 Segmental and somatic dysfunction of lumbar region: Secondary | ICD-10-CM | POA: Diagnosis not present

## 2019-09-23 DIAGNOSIS — M9901 Segmental and somatic dysfunction of cervical region: Secondary | ICD-10-CM | POA: Diagnosis not present

## 2019-09-23 DIAGNOSIS — M9904 Segmental and somatic dysfunction of sacral region: Secondary | ICD-10-CM | POA: Diagnosis not present

## 2019-09-23 DIAGNOSIS — M9902 Segmental and somatic dysfunction of thoracic region: Secondary | ICD-10-CM | POA: Diagnosis not present

## 2019-09-23 DIAGNOSIS — M4722 Other spondylosis with radiculopathy, cervical region: Secondary | ICD-10-CM | POA: Diagnosis not present

## 2019-09-23 DIAGNOSIS — M9903 Segmental and somatic dysfunction of lumbar region: Secondary | ICD-10-CM | POA: Diagnosis not present

## 2019-09-23 DIAGNOSIS — M5033 Other cervical disc degeneration, cervicothoracic region: Secondary | ICD-10-CM | POA: Diagnosis not present

## 2019-09-23 DIAGNOSIS — M4186 Other forms of scoliosis, lumbar region: Secondary | ICD-10-CM | POA: Diagnosis not present

## 2019-09-23 DIAGNOSIS — R293 Abnormal posture: Secondary | ICD-10-CM | POA: Diagnosis not present

## 2019-09-23 DIAGNOSIS — M5137 Other intervertebral disc degeneration, lumbosacral region: Secondary | ICD-10-CM | POA: Diagnosis not present

## 2019-09-28 DIAGNOSIS — M9902 Segmental and somatic dysfunction of thoracic region: Secondary | ICD-10-CM | POA: Diagnosis not present

## 2019-09-28 DIAGNOSIS — M4186 Other forms of scoliosis, lumbar region: Secondary | ICD-10-CM | POA: Diagnosis not present

## 2019-09-28 DIAGNOSIS — M9904 Segmental and somatic dysfunction of sacral region: Secondary | ICD-10-CM | POA: Diagnosis not present

## 2019-09-28 DIAGNOSIS — M4722 Other spondylosis with radiculopathy, cervical region: Secondary | ICD-10-CM | POA: Diagnosis not present

## 2019-09-28 DIAGNOSIS — M5137 Other intervertebral disc degeneration, lumbosacral region: Secondary | ICD-10-CM | POA: Diagnosis not present

## 2019-09-28 DIAGNOSIS — M9903 Segmental and somatic dysfunction of lumbar region: Secondary | ICD-10-CM | POA: Diagnosis not present

## 2019-09-28 DIAGNOSIS — M5033 Other cervical disc degeneration, cervicothoracic region: Secondary | ICD-10-CM | POA: Diagnosis not present

## 2019-09-28 DIAGNOSIS — M9901 Segmental and somatic dysfunction of cervical region: Secondary | ICD-10-CM | POA: Diagnosis not present

## 2019-09-28 DIAGNOSIS — R293 Abnormal posture: Secondary | ICD-10-CM | POA: Diagnosis not present

## 2019-10-05 DIAGNOSIS — M9901 Segmental and somatic dysfunction of cervical region: Secondary | ICD-10-CM | POA: Diagnosis not present

## 2019-10-05 DIAGNOSIS — M9903 Segmental and somatic dysfunction of lumbar region: Secondary | ICD-10-CM | POA: Diagnosis not present

## 2019-10-05 DIAGNOSIS — M4186 Other forms of scoliosis, lumbar region: Secondary | ICD-10-CM | POA: Diagnosis not present

## 2019-10-05 DIAGNOSIS — M5137 Other intervertebral disc degeneration, lumbosacral region: Secondary | ICD-10-CM | POA: Diagnosis not present

## 2019-10-05 DIAGNOSIS — M9902 Segmental and somatic dysfunction of thoracic region: Secondary | ICD-10-CM | POA: Diagnosis not present

## 2019-10-05 DIAGNOSIS — M5033 Other cervical disc degeneration, cervicothoracic region: Secondary | ICD-10-CM | POA: Diagnosis not present

## 2019-10-05 DIAGNOSIS — R293 Abnormal posture: Secondary | ICD-10-CM | POA: Diagnosis not present

## 2019-10-05 DIAGNOSIS — M9904 Segmental and somatic dysfunction of sacral region: Secondary | ICD-10-CM | POA: Diagnosis not present

## 2019-10-05 DIAGNOSIS — M4722 Other spondylosis with radiculopathy, cervical region: Secondary | ICD-10-CM | POA: Diagnosis not present

## 2019-10-08 DIAGNOSIS — M5137 Other intervertebral disc degeneration, lumbosacral region: Secondary | ICD-10-CM | POA: Diagnosis not present

## 2019-10-08 DIAGNOSIS — M5033 Other cervical disc degeneration, cervicothoracic region: Secondary | ICD-10-CM | POA: Diagnosis not present

## 2019-10-08 DIAGNOSIS — M9902 Segmental and somatic dysfunction of thoracic region: Secondary | ICD-10-CM | POA: Diagnosis not present

## 2019-10-08 DIAGNOSIS — M9904 Segmental and somatic dysfunction of sacral region: Secondary | ICD-10-CM | POA: Diagnosis not present

## 2019-10-08 DIAGNOSIS — M4722 Other spondylosis with radiculopathy, cervical region: Secondary | ICD-10-CM | POA: Diagnosis not present

## 2019-10-08 DIAGNOSIS — M9903 Segmental and somatic dysfunction of lumbar region: Secondary | ICD-10-CM | POA: Diagnosis not present

## 2019-10-08 DIAGNOSIS — R293 Abnormal posture: Secondary | ICD-10-CM | POA: Diagnosis not present

## 2019-10-08 DIAGNOSIS — M9901 Segmental and somatic dysfunction of cervical region: Secondary | ICD-10-CM | POA: Diagnosis not present

## 2019-10-08 DIAGNOSIS — M4186 Other forms of scoliosis, lumbar region: Secondary | ICD-10-CM | POA: Diagnosis not present

## 2019-10-12 DIAGNOSIS — R293 Abnormal posture: Secondary | ICD-10-CM | POA: Diagnosis not present

## 2019-10-12 DIAGNOSIS — M5033 Other cervical disc degeneration, cervicothoracic region: Secondary | ICD-10-CM | POA: Diagnosis not present

## 2019-10-12 DIAGNOSIS — M9903 Segmental and somatic dysfunction of lumbar region: Secondary | ICD-10-CM | POA: Diagnosis not present

## 2019-10-12 DIAGNOSIS — M9904 Segmental and somatic dysfunction of sacral region: Secondary | ICD-10-CM | POA: Diagnosis not present

## 2019-10-12 DIAGNOSIS — M5137 Other intervertebral disc degeneration, lumbosacral region: Secondary | ICD-10-CM | POA: Diagnosis not present

## 2019-10-12 DIAGNOSIS — M9901 Segmental and somatic dysfunction of cervical region: Secondary | ICD-10-CM | POA: Diagnosis not present

## 2019-10-12 DIAGNOSIS — M4186 Other forms of scoliosis, lumbar region: Secondary | ICD-10-CM | POA: Diagnosis not present

## 2019-10-12 DIAGNOSIS — M9902 Segmental and somatic dysfunction of thoracic region: Secondary | ICD-10-CM | POA: Diagnosis not present

## 2019-10-12 DIAGNOSIS — M4722 Other spondylosis with radiculopathy, cervical region: Secondary | ICD-10-CM | POA: Diagnosis not present

## 2019-10-14 DIAGNOSIS — M5033 Other cervical disc degeneration, cervicothoracic region: Secondary | ICD-10-CM | POA: Diagnosis not present

## 2019-10-14 DIAGNOSIS — M9903 Segmental and somatic dysfunction of lumbar region: Secondary | ICD-10-CM | POA: Diagnosis not present

## 2019-10-14 DIAGNOSIS — M9901 Segmental and somatic dysfunction of cervical region: Secondary | ICD-10-CM | POA: Diagnosis not present

## 2019-10-14 DIAGNOSIS — M9904 Segmental and somatic dysfunction of sacral region: Secondary | ICD-10-CM | POA: Diagnosis not present

## 2019-10-14 DIAGNOSIS — M4186 Other forms of scoliosis, lumbar region: Secondary | ICD-10-CM | POA: Diagnosis not present

## 2019-10-14 DIAGNOSIS — M4722 Other spondylosis with radiculopathy, cervical region: Secondary | ICD-10-CM | POA: Diagnosis not present

## 2019-10-14 DIAGNOSIS — R293 Abnormal posture: Secondary | ICD-10-CM | POA: Diagnosis not present

## 2019-10-14 DIAGNOSIS — M9902 Segmental and somatic dysfunction of thoracic region: Secondary | ICD-10-CM | POA: Diagnosis not present

## 2019-10-14 DIAGNOSIS — M5137 Other intervertebral disc degeneration, lumbosacral region: Secondary | ICD-10-CM | POA: Diagnosis not present

## 2019-10-19 DIAGNOSIS — M9904 Segmental and somatic dysfunction of sacral region: Secondary | ICD-10-CM | POA: Diagnosis not present

## 2019-10-19 DIAGNOSIS — M9901 Segmental and somatic dysfunction of cervical region: Secondary | ICD-10-CM | POA: Diagnosis not present

## 2019-10-19 DIAGNOSIS — R293 Abnormal posture: Secondary | ICD-10-CM | POA: Diagnosis not present

## 2019-10-19 DIAGNOSIS — M5033 Other cervical disc degeneration, cervicothoracic region: Secondary | ICD-10-CM | POA: Diagnosis not present

## 2019-10-19 DIAGNOSIS — M4722 Other spondylosis with radiculopathy, cervical region: Secondary | ICD-10-CM | POA: Diagnosis not present

## 2019-10-19 DIAGNOSIS — M9903 Segmental and somatic dysfunction of lumbar region: Secondary | ICD-10-CM | POA: Diagnosis not present

## 2019-10-19 DIAGNOSIS — M4186 Other forms of scoliosis, lumbar region: Secondary | ICD-10-CM | POA: Diagnosis not present

## 2019-10-19 DIAGNOSIS — M9902 Segmental and somatic dysfunction of thoracic region: Secondary | ICD-10-CM | POA: Diagnosis not present

## 2019-10-19 DIAGNOSIS — M5137 Other intervertebral disc degeneration, lumbosacral region: Secondary | ICD-10-CM | POA: Diagnosis not present

## 2019-10-21 DIAGNOSIS — M5033 Other cervical disc degeneration, cervicothoracic region: Secondary | ICD-10-CM | POA: Diagnosis not present

## 2019-10-21 DIAGNOSIS — M4186 Other forms of scoliosis, lumbar region: Secondary | ICD-10-CM | POA: Diagnosis not present

## 2019-10-21 DIAGNOSIS — M4722 Other spondylosis with radiculopathy, cervical region: Secondary | ICD-10-CM | POA: Diagnosis not present

## 2019-10-21 DIAGNOSIS — M9903 Segmental and somatic dysfunction of lumbar region: Secondary | ICD-10-CM | POA: Diagnosis not present

## 2019-10-21 DIAGNOSIS — R293 Abnormal posture: Secondary | ICD-10-CM | POA: Diagnosis not present

## 2019-10-21 DIAGNOSIS — M9902 Segmental and somatic dysfunction of thoracic region: Secondary | ICD-10-CM | POA: Diagnosis not present

## 2019-10-21 DIAGNOSIS — M9904 Segmental and somatic dysfunction of sacral region: Secondary | ICD-10-CM | POA: Diagnosis not present

## 2019-10-21 DIAGNOSIS — M5137 Other intervertebral disc degeneration, lumbosacral region: Secondary | ICD-10-CM | POA: Diagnosis not present

## 2019-10-21 DIAGNOSIS — M9901 Segmental and somatic dysfunction of cervical region: Secondary | ICD-10-CM | POA: Diagnosis not present

## 2019-10-26 DIAGNOSIS — M9903 Segmental and somatic dysfunction of lumbar region: Secondary | ICD-10-CM | POA: Diagnosis not present

## 2019-10-26 DIAGNOSIS — M9901 Segmental and somatic dysfunction of cervical region: Secondary | ICD-10-CM | POA: Diagnosis not present

## 2019-10-26 DIAGNOSIS — M5033 Other cervical disc degeneration, cervicothoracic region: Secondary | ICD-10-CM | POA: Diagnosis not present

## 2019-10-26 DIAGNOSIS — R293 Abnormal posture: Secondary | ICD-10-CM | POA: Diagnosis not present

## 2019-10-26 DIAGNOSIS — M9904 Segmental and somatic dysfunction of sacral region: Secondary | ICD-10-CM | POA: Diagnosis not present

## 2019-10-26 DIAGNOSIS — M4722 Other spondylosis with radiculopathy, cervical region: Secondary | ICD-10-CM | POA: Diagnosis not present

## 2019-10-26 DIAGNOSIS — M9902 Segmental and somatic dysfunction of thoracic region: Secondary | ICD-10-CM | POA: Diagnosis not present

## 2019-10-26 DIAGNOSIS — M5137 Other intervertebral disc degeneration, lumbosacral region: Secondary | ICD-10-CM | POA: Diagnosis not present

## 2019-10-26 DIAGNOSIS — M4186 Other forms of scoliosis, lumbar region: Secondary | ICD-10-CM | POA: Diagnosis not present

## 2019-10-29 DIAGNOSIS — M9904 Segmental and somatic dysfunction of sacral region: Secondary | ICD-10-CM | POA: Diagnosis not present

## 2019-10-29 DIAGNOSIS — M9903 Segmental and somatic dysfunction of lumbar region: Secondary | ICD-10-CM | POA: Diagnosis not present

## 2019-10-29 DIAGNOSIS — M5033 Other cervical disc degeneration, cervicothoracic region: Secondary | ICD-10-CM | POA: Diagnosis not present

## 2019-10-29 DIAGNOSIS — R293 Abnormal posture: Secondary | ICD-10-CM | POA: Diagnosis not present

## 2019-10-29 DIAGNOSIS — M9902 Segmental and somatic dysfunction of thoracic region: Secondary | ICD-10-CM | POA: Diagnosis not present

## 2019-10-29 DIAGNOSIS — M4186 Other forms of scoliosis, lumbar region: Secondary | ICD-10-CM | POA: Diagnosis not present

## 2019-10-29 DIAGNOSIS — M9901 Segmental and somatic dysfunction of cervical region: Secondary | ICD-10-CM | POA: Diagnosis not present

## 2019-10-29 DIAGNOSIS — M5137 Other intervertebral disc degeneration, lumbosacral region: Secondary | ICD-10-CM | POA: Diagnosis not present

## 2019-10-29 DIAGNOSIS — M4722 Other spondylosis with radiculopathy, cervical region: Secondary | ICD-10-CM | POA: Diagnosis not present

## 2019-12-01 DIAGNOSIS — Z0130 Encounter for examination of blood pressure without abnormal findings: Secondary | ICD-10-CM | POA: Diagnosis not present

## 2019-12-02 DIAGNOSIS — Z20822 Contact with and (suspected) exposure to covid-19: Secondary | ICD-10-CM | POA: Diagnosis not present

## 2019-12-08 NOTE — Progress Notes (Signed)
Cardiology Office Note    Date:  12/11/2019   ID:  JOCSAN MCGINLEY, DOB 08-13-1945, MRN [id number]  PCP:  Ma Hillock, DO  Cardiologist:  Larae Grooms, MD  Electrophysiologist:  Thompson Grayer, MD   Chief Complaint: f/u CAD, bradycardia, atrial flutter  History of Present Illness:   Donald [name] is a 74 y.o. male with history of originally from [place] with history of CAD s/p CABG in 2001, atrial flutter (ablation 2006, 2011), renal cancer s/p left nephrectomy in 02/2019, BPH, HTN, HLD, IRBBB who presents for annual follow-up. He has been off Coumadin due to lack of recurrence. His HR has been chronically in the high 40s, managed with watchful waiting and cessation of beta blocker. Last echo 2005 showed EF 50% with RWMA (diagram difficult to interpert), mild LAE. I met him last year and he was doing well.  He presents for routine cardiac follow-up without any recent angina or dyspnea but has been experiencing episodic dizziness for the past 6 months. This does not happen at rest, only primarily when upright doing physical activity or when he gets hot (I.e. yardwork outside). He has been trying to drink enough fluids but doesn't think he's doing so. Still goes to the gym and exercises. He has made it a point recently to begin checking his BP when this happens. On the occasion on 2/56, he got systolic readings down into the 80s which improved to 110/60 by the time he went to [facility]. Another time he saw a systolic BP reading of 389 associated with dizziness. By the time he checks his HR he reports it has been normal, although on his sheet during the episode with BP in the 80s it looks like he recorded a pulse of 130.  He reports his BP has otherwise been totally normal while seated. He has not had any palpitations, chest pain, dyspnea, orthopnea or any other acute symptoms. He has not had any low readings. No unusual bleeding reported. No syncope or falls reported.  Labwork independently  reviewed: Patient brings in [place] labs 10/2019 with Hgb 12.1, WBC, normal, K 4, Cr 0.893, BUN 17, LFTs wnl, LDL 59, 11/2019 Hgb 12.2 and normal iron panel KPN 08/2019 K 4.3, Cr 0.80, ALT 12 06/2019 K 4.1, Cr 0.98, LFTs wnl 11/2018 TSH wnl    Past Medical History:  Diagnosis Date  . Allergic rhinitis   . BPH (benign prostatic hyperplasia)   . Chicken pox   . Coronary artery disease    a. MI 2003 with CABG.  . Hematuria   . History of basal cell carcinoma (BCC) excision    left upper back, right chest area  . History of colon polyps   . History of MI (myocardial infarction) 08/04/2001   s/p  cabg  . Hydronephrosis, right   . Hypertension   . HYPERTENSION, MILD 05/18/2009   Qualifier: Diagnosis of  By: Rayann Heman, MD, Jeneen Rinks    . Incomplete right bundle branch block (RBBB)   . 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 Osteoarthritis of knee 05/13/2017  . Renal cell carcinoma ([place]) 02/04/2019   Partial nephrectomyKIDNEY, LEFT, PARTIAL NEPHRECTOMY:   . Right ureteral stone   . S/P ablation of atrial flutter    followed by dr Rayann Heman (EP cardiolgoist)  ablation 04-17-2004  and by dr allred 02-10- 2011 was successful  . S/P CABG x 5 08-05-2001   in [place] , [place]   LIMA to LAD,  seqVG  to Diagonal,  SeqVG to OM,  seqVG to PDA and PLA  . Seasonal allergies   . Sinus bradycardia   . Wears glasses     Past Surgical History:  Procedure Laterality Date  . CARDIAC CATHETERIZATION  08-04-2001  in [place], [place]   severe 3 vessel cad  . CARDIAC ELECTROPHYSIOLOGY MAPPING AND ABLATION  x2  04-17-2004 [place], [place]);  04-14-2009 by dr allred   per dr allred report-- successful ablation clockwise isthmus-dependent right atrial flutter along the usual cavotricupid isthmus;  complete bidirectionl isthmus block achieved  . CORONARY ARTERY BYPASS GRAFT  08-06-2003  in [place], [place]   x5-- LIMA - LAD,  seqVG to Diagonal, OM, PDA, and PLA  . CYSTOSCOPY WITH RETROGRADE PYELOGRAM, URETEROSCOPY AND STENT PLACEMENT  Right 08/10/2016   Procedure: CYSTOSCOPY WITH RETROGRADE PYELOGRAM, URETEROSCOPY AND STENT PLACEMENT;  Surgeon: Alexis Frock, MD;  Location: [facility];  Service: Urology;  Laterality: Right;  . FIXATION RIGHT WRIST Right 2011   stability for arthritis  . INGUINAL HERNIA REPAIR Right 08-08-2015   [facility]- health care at [place]  . ROBOTIC ASSITED PARTIAL NEPHRECTOMY Left 02/04/2019   Procedure: XI ROBOTIC ASSITED LEFT PARTIAL NEPHRECTOMY; LEFT PARTIAL ADRENALECTOMY;  Surgeon: Alexis Frock, MD;  Location: [facility];  Service: Urology;  Laterality: Left;  3 HRS  . ROTATOR CUFF REPAIR Right 2004;  2007  . WISDOM TOOTH EXTRACTION      Current Medications: Current Meds  Medication Sig  . aspirin 81 MG chewable tablet Chew 81 mg by mouth daily.  Marland Kitchen atorvastatin (LIPITOR) 40 MG tablet Take 40 mg by mouth at bedtime.   . Multiple Vitamin (MULTIVITAMIN ADULT PO) Take by mouth.  . Omega-3 Fatty Acids (FISH OIL) 1000 MG CAPS Take by mouth.  . Red Yeast Rice 600 MG CAPS Take by mouth.     Allergies:   Patient has no known allergies.   Social History   Socioeconomic History  . Marital status: Married    Spouse name: Not on file  . Number of children: Not on file  . Years of education: Not on file  . Highest education level: Not on file  Occupational History  . Not on file  Tobacco Use  . Smoking status: Never Smoker  . Smokeless tobacco: Current User    Types: Snuff  . Tobacco comment: 08-02-2016 ~ dip tobacco for 30 yrs  Vaping Use  . Vaping Use: Never used  Substance and Sexual Activity  . Alcohol use: Yes    Comment: occ.   . Drug use: No  . Sexual activity: Not Currently    Partners: Female  Other Topics Concern  . 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 Difficulty of Paying Living Expenses: Not on file  Food Insecurity:   . Worried About Charity fundraiser in the Last Year: Not on file  . Ran Out of Food in  the Last Year: Not on file  Transportation Needs:   . Lack of Transportation (Medical): Not on file  . Lack of Transportation (Non-Medical): Not on file  Physical Activity:   . Days of Exercise per Week: Not on file  . Minutes of Exercise per Session: Not on file  Stress:   . Feeling of Stress : Not on file  Social Connections:   . Frequency of Communication with Friends and Family: Not on file  . Frequency of Social Gatherings with Friends and Family: Not on file  . Attends Religious Services: Not on file  . Active Member of Clubs or Organizations: Not on file  . Attends Archivist Meetings: Not on file  . Marital Status: Not on file     Family History:  The patient's family history includes Alzheimer's disease in his mother; Cancer in his father; Heart attack in his mother. There is no history of Hypertension.  ROS:   Please see the history of present illness.  All other systems are reviewed and otherwise negative.    EKGs/Labs/Other Studies Reviewed:    Studies reviewed are outlined and summarized above. Reports included below if pertinent.  Last echo 2005 showed EF 50% with RWMA (diagram difficult to interpret), mild LAE. (Report scanned)    EKG:  EKG is ordered today, personally reviewed, demonstrating NSR 64bpm, IRBBB, nonspecific TW changes, similar to prior.  Recent Labs: 02/02/2019: Platelets 212 02/05/2019: Hemoglobin 11.0 06/22/2019: ALT 14; BUN 20; Creatinine, Ser 0.95; Potassium 4.1; Sodium 141  Recent Lipid Panel    Component Value Date/Time   HDL 49 05/20/2014 0000   LDLCALC 59 05/20/2014 0000     PHYSICAL EXAM:    VS:  BP 110/70   Pulse 64   Ht 5' 5"  (1.651 m)   Wt 155 lb (70.3 kg)   SpO2 96%   BMI 25.79 kg/m   BMI: Body mass index is 25.79 kg/m.  GEN: Well nourished, well developed WM, in no acute distress HEENT: normocephalic, atraumatic Neck: no JVD, carotid bruits, or masses Cardiac: RRR; no murmurs, rubs, or gallops, no edema  Respiratory:  clear to auscultation bilaterally, normal work of breathing GI: soft, nontender, nondistended, + BS MS: no deformity or atrophy Skin: warm and dry, no rash Neuro:  Alert and Oriented x 3, Strength and sensation are intact, follows commands Psych: euthymic mood, full affect  Wt Readings from Last 3 Encounters:  12/11/19 155 lb (70.3 kg)  06/22/19 161 lb 6 oz (73.2 kg)  02/04/19 164 lb (74.4 kg)     ASSESSMENT & PLAN:   1. Orthostasis/dizziness - the patient's initial BP was 110/70 today. When I checked it after he had been sitting for some time it was 128/62. When he stood up I auscultated a blood pressure of 100/59. No significant HR change. He did not feel dizzy with this but does indicate an orthostatic pressure difference. He has had hypotension at home associated with dizziness as well. We will check labs today to include CBC, BMET and TSH. (He had labs earlier in September but this was much earlier before the episode in late September.) We will arrange a 2D echocardiogram to evaluate cardiac structure. Although his symptoms sound more blood pressure related than HR related, he does have a history of bradycardia and also had an abnormal HR reading when he checked his vitals on 12/02/19 therefore we will undertake a 2 week Zio to evaluate for any brady- or tachy-arrhythmias and ensure adequate HR variation.  Will try a trial of midodrine 2.79m TID coupled with recommendation for trial of compression hose and increased fluid intake. 2. CAD s/p CABG - no recent angina. If workup above is unrevealing we could consider updating  stress testing but will start with the above. I'll also plan to review with Dr. VIrish Lack Warning sx reviewed. 3. Atrial flutter - NSR on exam today. Plan monitor as above. 4. Sinus bradycardia - HR normal today. Will check TSH and follow HRs with monitor. 5. Hyperlipidemia - last LDL looked great in 10/2019, continue statin, LFTs were normal.  Disposition: F/u with myself or Dr. [facility]assell [facility] team if possible after event monitor completion.  Medication Adjustments/Labs and Tests Ordered: Current medicines are reviewed at length with the patient today.  Concerns regarding medicines are outlined above. Medication changes, Labs and Tests ordered today are summarized above and listed in the Patient Instructions accessible in Encounters.   Signed, DCharlie Pitter PA-C  12/11/2019 10:20 AM    [facility] [address] [place]  [id number]: [phone] Fax: [phone]

## 2019-12-11 ENCOUNTER — Ambulatory Visit: Payer: PPO | Admitting: Physician Assistant

## 2019-12-11 ENCOUNTER — Telehealth: Payer: Self-pay | Admitting: Radiology

## 2019-12-11 ENCOUNTER — Encounter: Payer: Self-pay | Admitting: Physician Assistant

## 2019-12-11 ENCOUNTER — Other Ambulatory Visit: Payer: Self-pay

## 2019-12-11 VITALS — BP 110/70 | HR 64 | Ht 65.0 in | Wt 155.0 lb

## 2019-12-11 DIAGNOSIS — R001 Bradycardia, unspecified: Secondary | ICD-10-CM | POA: Diagnosis not present

## 2019-12-11 DIAGNOSIS — I2510 Atherosclerotic heart disease of native coronary artery without angina pectoris: Secondary | ICD-10-CM | POA: Diagnosis not present

## 2019-12-11 DIAGNOSIS — R42 Dizziness and giddiness: Secondary | ICD-10-CM

## 2019-12-11 DIAGNOSIS — E785 Hyperlipidemia, unspecified: Secondary | ICD-10-CM | POA: Diagnosis not present

## 2019-12-11 DIAGNOSIS — I951 Orthostatic hypotension: Secondary | ICD-10-CM

## 2019-12-11 DIAGNOSIS — I4892 Unspecified atrial flutter: Secondary | ICD-10-CM

## 2019-12-11 LAB — CBC
Hematocrit: 38.9 % (ref 37.5–51.0)
Hemoglobin: 12.7 g/dL — ABNORMAL LOW (ref 13.0–17.7)
MCH: 28.7 pg (ref 26.6–33.0)
MCHC: 32.6 g/dL (ref 31.5–35.7)
MCV: 88 fL (ref 79–97)
Platelets: 280 10*3/uL (ref 150–450)
RBC: 4.42 x10E6/uL (ref 4.14–5.80)
RDW: 12.4 % (ref 11.6–15.4)
WBC: 6.2 10*3/uL (ref 3.4–10.8)

## 2019-12-11 LAB — BASIC METABOLIC PANEL
BUN/Creatinine Ratio: 17 (ref 10–24)
BUN: 15 mg/dL (ref 8–27)
CO2: 26 mmol/L (ref 20–29)
Calcium: 9.5 mg/dL (ref 8.6–10.2)
Chloride: 104 mmol/L (ref 96–106)
Creatinine, Ser: 0.86 mg/dL (ref 0.76–1.27)
GFR calc Af Amer: 97 mL/min/{1.73_m2} (ref >59)
GFR calc non Af Amer: 84 mL/min/{1.73_m2} (ref >59)
Glucose: 92 mg/dL (ref 65–99)
Potassium: 4.9 mmol/L (ref 3.5–5.2)
Sodium: 141 mmol/L (ref 134–144)

## 2019-12-11 LAB — TSH: TSH: 1.67 u[IU]/mL (ref 0.450–4.500)

## 2019-12-11 MED ORDER — MIDODRINE HCL 2.5 MG PO TABS: 2.5000 mg | ORAL_TABLET | Freq: Three times a day (TID) | ORAL | 1 refills | Status: DC

## 2019-12-11 NOTE — Telephone Encounter (Signed)
Enrolled patient for a 14 day Zio XT  monitor to be mailed to patients home  °

## 2019-12-11 NOTE — Patient Instructions (Addendum)
Medication Instructions:  Your physician has recommended you make the following change in your medication:  1.  START Midodrine 2.5 mg taking 1 tablet three times a day  *If you need a refill on your cardiac medications before your next appointment, please call your pharmacy*   Lab Work: TODAY:  BMET, CBC, & TSH  If you have labs (blood work) drawn today and your tests are completely normal, you will receive your results only by: Marland Kitchen MyChart Message (if you have MyChart) OR . A paper copy in the mail If you have any lab test that is abnormal or we need to change your treatment, we will call you to review the results.   Testing/Procedures: Your physician has requested that you have an echocardiogram. Echocardiography is a painless test that uses sound waves to create images of your heart. It provides your doctor with information about the size and shape of your heart and how well your heart's chambers and valves are working. This procedure takes approximately one hour. There are no restrictions for this procedure.   ZIO XT- Long Term Monitor Instructions   Your physician has requested you wear your ZIO patch monitor 14 days.   This is a single patch monitor.  [employer] supplies one patch monitor per enrollment.  Additional stickers are not available.   Please do not apply patch if you will be having a Nuclear Stress Test, Echocardiogram, Cardiac CT, MRI, or Chest Xray during the time frame you would be wearing the monitor. The patch cannot be worn during these tests.  You cannot remove and re-apply the ZIO XT patch monitor.   Your ZIO patch monitor will be sent USPS Priority mail from [facility] directly to your home address. The monitor may also be mailed to a PO BOX if home delivery is not available.   It may take 3-5 days to receive your monitor after you have been enrolled.   Once you have received you monitor, please review enclosed instructions.  Your monitor has already been  registered assigning a specific monitor serial # to you.   Applying the monitor   Shave hair from upper left chest.   Hold abrader disc by orange tab.  Rub abrader in 40 strokes over left upper chest as indicated in your monitor instructions.   Clean area with 4 enclosed alcohol pads .  Use all pads to assure are is cleaned thoroughly.  Let dry.   Apply patch as indicated in monitor instructions.  Patch will be place under collarbone on left side of chest with arrow pointing upward.   Rub patch adhesive wings for 2 minutes.Remove white label marked "1".  Remove white label marked "2".  Rub patch adhesive wings for 2 additional minutes.   While looking in a mirror, press and release button in center of patch.  A small green light will flash 3-4 times .  This will be your only indicator the monitor has been turned on.     Do not shower for the first 24 hours.  You may shower after the first 24 hours.   Press button if you feel a symptom. You will hear a small click.  Record Date, Time and Symptom in the Patient Log Book.   When you are ready to remove patch, follow instructions on last 2 pages of Patient Log Book.  Stick patch monitor onto last page of Patient Log Book.   Place Patient Log Book in [place] box.  Use locking tab on  box and tape box closed securely.  The [employer] has [employer] on it.  Please place in mailbox as soon as possible.  Your physician should have your test results approximately 7 days after the monitor has been mailed back to [facility].   Call [place] at [phone] if you have questions regarding your ZIO XT patch monitor.  Call them immediately if you see an orange light blinking on your monitor.   If your monitor falls off in less than 4 days contact our Monitor department at [phone].  If your monitor becomes loose or falls off after 4 days call [employer] at [phone] for suggestions on securing your monitor.      Follow-Up: At [facility], you and your health needs are our priority.  As part of our continuing mission to provide you with exceptional heart care, we have created designated Provider Care Teams.  These Care Teams include your primary Cardiologist (physician) and Advanced Practice Providers (APPs -  Physician Assistants and Nurse Practitioners) who all work together to provide you with the care you need, when you need it.  We recommend signing up for the patient portal called "MyChart".  Sign up information is provided on this After Visit Summary.  MyChart is used to connect with patients for Virtual Visits (Telemedicine).  Patients are able to view lab/test results, encounter notes, upcoming appointments, etc.  Non-urgent messages can be sent to your provider as well.   To learn more about what you can do with MyChart, go to [email or web address].    Your next appointment:   4-6 weeks  The format for your next appointment:   In Person  Provider:   You may see Larae Grooms, MD or one of the following Advanced Practice Providers on your designated Care Team:    Melina Copa, PA-C     Other Instructions  Increase your water intake to at least 64oz per day.  You may try compression hose over the counter that go on your legs to keep blood flow back up to the heart.    Echocardiogram An echocardiogram is a procedure that uses painless sound waves (ultrasound) to produce an image of the heart. Images from an echocardiogram can provide important information about:  Signs of coronary artery disease (CAD).  Aneurysm detection. An aneurysm is a weak or damaged part of an artery wall that bulges out from the normal force of blood pumping through the body.  Heart size and shape. Changes in the size or shape of the heart can be associated with certain conditions, including heart failure, aneurysm, and CAD.  Heart muscle function.  Heart valve function.  Signs of a past  heart attack.  Fluid buildup around the heart.  Thickening of the heart muscle.  A tumor or infectious growth around the heart valves. Tell a health care provider about:  Any allergies you have.  All medicines you are taking, including vitamins, herbs, eye drops, creams, and over-the-counter medicines.  Any blood disorders you have.  Any surgeries you have had.  Any medical conditions you have.  Whether you are pregnant or may be pregnant. What are the risks? Generally, this is a safe procedure. However, problems may occur, including:  Allergic reaction to dye (contrast) that may be used during the procedure. What happens before the procedure? No specific preparation is needed. You may eat and drink normally. What happens during the procedure?   An IV tube may be inserted into one of  your veins.  You may receive contrast through this tube. A contrast is an injection that improves the quality of the pictures from your heart.  A gel will be applied to your chest.  A wand-like tool (transducer) will be moved over your chest. The gel will help to transmit the sound waves from the transducer.  The sound waves will harmlessly bounce off of your heart to allow the heart images to be captured in real-time motion. The images will be recorded on a computer. The procedure may vary among health care providers and hospitals. What happens after the procedure?  You may return to your normal, everyday life, including diet, activities, and medicines, unless your health care provider tells you not to do that. Summary  An echocardiogram is a procedure that uses painless sound waves (ultrasound) to produce an image of the heart.  Images from an echocardiogram can provide important information about the size and shape of your heart, heart muscle function, heart valve function, and fluid buildup around your heart.  You do not need to do anything to prepare before this procedure. You may eat  and drink normally.  After the echocardiogram is completed, you may return to your normal, everyday life, unless your health care provider tells you not to do that. This information is not intended to replace advice given to you by your health care provider. Make sure you discuss any questions you have with your health care provider. Document Revised: 06/12/2018 Document Reviewed: 03/24/2016 Elsevier Patient Education  [place].

## 2019-12-14 ENCOUNTER — Other Ambulatory Visit: Payer: Self-pay

## 2019-12-14 ENCOUNTER — Ambulatory Visit (INDEPENDENT_AMBULATORY_CARE_PROVIDER_SITE_OTHER): Payer: PPO

## 2019-12-14 DIAGNOSIS — Z23 Encounter for immunization: Secondary | ICD-10-CM | POA: Diagnosis not present

## 2019-12-15 ENCOUNTER — Other Ambulatory Visit (INDEPENDENT_AMBULATORY_CARE_PROVIDER_SITE_OTHER): Payer: PPO

## 2019-12-15 DIAGNOSIS — I951 Orthostatic hypotension: Secondary | ICD-10-CM

## 2019-12-15 DIAGNOSIS — R001 Bradycardia, unspecified: Secondary | ICD-10-CM

## 2019-12-15 DIAGNOSIS — R42 Dizziness and giddiness: Secondary | ICD-10-CM

## 2019-12-15 DIAGNOSIS — I4892 Unspecified atrial flutter: Secondary | ICD-10-CM

## 2019-12-15 DIAGNOSIS — E785 Hyperlipidemia, unspecified: Secondary | ICD-10-CM

## 2019-12-15 DIAGNOSIS — I2510 Atherosclerotic heart disease of native coronary artery without angina pectoris: Secondary | ICD-10-CM

## 2019-12-18 ENCOUNTER — Encounter: Payer: Self-pay | Admitting: Physician Assistant

## 2019-12-18 NOTE — Progress Notes (Signed)
   Following up recent OV: discussed case with Dr. Irish Lack who agrees would not pursue ischemic eval based on this clinical presentation given lack of anginal symptoms otherwise - await workup as planned. Stephnie Parlier PA-C

## 2020-01-01 ENCOUNTER — Ambulatory Visit (HOSPITAL_COMMUNITY): Payer: PPO | Attending: Cardiology

## 2020-01-01 ENCOUNTER — Other Ambulatory Visit: Payer: Self-pay

## 2020-01-01 DIAGNOSIS — R001 Bradycardia, unspecified: Secondary | ICD-10-CM | POA: Diagnosis not present

## 2020-01-01 DIAGNOSIS — I4892 Unspecified atrial flutter: Secondary | ICD-10-CM | POA: Diagnosis not present

## 2020-01-01 DIAGNOSIS — I951 Orthostatic hypotension: Secondary | ICD-10-CM | POA: Diagnosis not present

## 2020-01-01 DIAGNOSIS — R42 Dizziness and giddiness: Secondary | ICD-10-CM | POA: Diagnosis not present

## 2020-01-01 DIAGNOSIS — E785 Hyperlipidemia, unspecified: Secondary | ICD-10-CM | POA: Insufficient documentation

## 2020-01-01 DIAGNOSIS — I2510 Atherosclerotic heart disease of native coronary artery without angina pectoris: Secondary | ICD-10-CM | POA: Diagnosis not present

## 2020-01-01 LAB — ECHOCARDIOGRAM COMPLETE
Area-P 1/2: 2.48 cm2
S' Lateral: 3.2 cm

## 2020-01-08 DIAGNOSIS — R42 Dizziness and giddiness: Secondary | ICD-10-CM | POA: Diagnosis not present

## 2020-01-08 DIAGNOSIS — R001 Bradycardia, unspecified: Secondary | ICD-10-CM | POA: Diagnosis not present

## 2020-01-08 DIAGNOSIS — I4892 Unspecified atrial flutter: Secondary | ICD-10-CM | POA: Diagnosis not present

## 2020-01-12 NOTE — Progress Notes (Signed)
Cardiology Office Note   Date:  01/14/2020   ID:  Donald [name], DOB Feb 11, 1946, MRN [id number]  PCP:  Donald Hillock, DO    No chief complaint on file.  CAD  Wt Readings from Last 3 Encounters:  01/14/20 157 lb (71.2 kg)  12/11/19 155 lb (70.3 kg)  06/22/19 161 lb 6 oz (73.2 kg)       History of Present Illness: Donald [name] is a 74 y.o. male  who has CAD, CABG in 2001and a h/o AFlutter. He had an ablation for atrial flutter in 2011, and several years before that in [place]. He is off of coumadin.   Kidney stone in 2018- Dr. Tresa [name]. He had a tumor removed from his left kidney in 2020.  Bradycardia in the 40-50s range over the past several years (2017-2021).   Had orthostasis in 10/21.  Midodrine was started due to some BP readings in the 80s.   He does not use the midodrine three times a day.  He forgets doses.   He does not notice much of a difference with this medicine.  He thinks he needs to drink more water as well. He has cut down on sodas as well.  Denies : Chest pain.  Leg edema. Nitroglycerin use. Orthopnea. Palpitations. Paroxysmal nocturnal dyspnea. Shortness of breath. Syncope.   He has lost weight.  He has less of an appetite. He feels that he is not eating healthy foods of late, and is not gaining weight.  He continues to walk regularly, 2-3 x/week. Shoulder issues limit his trips to the gym. Still does some yard work.    Monitor results from 2021 reviewed and reassuring.   Past Medical History:  Diagnosis Date  . Allergic rhinitis   . BPH (benign prostatic hyperplasia)   . Chicken pox   . Coronary artery disease    a. MI 2003 with CABG.  . Hematuria   . History of basal cell carcinoma (BCC) excision    left upper back, right chest area  . History of colon polyps   . History of MI (myocardial infarction) 08/04/2001   s/p  cabg  . Hydronephrosis, right   . Hypertension   . HYPERTENSION, MILD 05/18/2009   Qualifier: Diagnosis of  By: Donald Heman, MD,  Donald [name]    . Incomplete right bundle branch block (RBBB)   . 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 Osteoarthritis of knee 05/13/2017  . Renal cell carcinoma ([place]) 02/04/2019   Partial nephrectomyKIDNEY, LEFT, PARTIAL NEPHRECTOMY:   . Right ureteral stone   . S/P ablation of atrial flutter    followed by dr Donald [name] (EP cardiolgoist)  ablation 04-17-2004  and by dr allred 02-10- 2011 was successful  . S/P CABG x 5 08-05-2001   in [place] , [place]   LIMA to LAD,  seqVG to Diagonal,  SeqVG to OM,  seqVG to PDA and PLA  . Seasonal allergies   . Sinus bradycardia   . Wears glasses     Past Surgical History:  Procedure Laterality Date  . CARDIAC CATHETERIZATION  08-04-2001  in [place], [place]   severe 3 vessel cad  . CARDIAC ELECTROPHYSIOLOGY MAPPING AND ABLATION  x2  04-17-2004 [place], [place]);  04-14-2009 by dr allred   per dr allred report-- successful ablation clockwise isthmus-dependent right atrial flutter along the usual cavotricupid isthmus;  complete bidirectionl isthmus block achieved  . CORONARY ARTERY BYPASS GRAFT  08-06-2003  in [place], [place]  x5-- LIMA - LAD,  seqVG to Diagonal, OM, PDA, and PLA  . CYSTOSCOPY WITH RETROGRADE PYELOGRAM, URETEROSCOPY AND STENT PLACEMENT Right 08/10/2016   Procedure: CYSTOSCOPY WITH RETROGRADE PYELOGRAM, URETEROSCOPY AND STENT PLACEMENT;  Surgeon: Donald Frock, MD;  Location: [facility];  Service: Urology;  Laterality: Right;  . FIXATION RIGHT WRIST Right 2011   stability for arthritis  . INGUINAL HERNIA REPAIR Right 08-08-2015   [facility]- health care at [place]  . ROBOTIC ASSITED PARTIAL NEPHRECTOMY Left 02/04/2019   Procedure: XI ROBOTIC ASSITED LEFT PARTIAL NEPHRECTOMY; LEFT PARTIAL ADRENALECTOMY;  Surgeon: Donald Frock, MD;  [facility];  Service: Urology;  Laterality: Left;  3 HRS  . ROTATOR CUFF REPAIR Right 2004;  2007  . WISDOM TOOTH EXTRACTION       Current Outpatient Medications  Medication Sig Dispense  Refill  . aspirin 81 MG chewable tablet Chew 81 mg by mouth daily.    Marland Kitchen atorvastatin (LIPITOR) 40 MG tablet Take 40 mg by mouth at bedtime.     . midodrine (PROAMATINE) 2.5 MG tablet Take 1 tablet (2.5 mg total) by mouth 3 (three) times daily with meals. 90 tablet 1  . Multiple Vitamin (MULTIVITAMIN ADULT PO) Take by mouth.    . Omega-3 Fatty Acids (FISH OIL) 1000 MG CAPS Take by mouth.    . Red Yeast Rice 600 MG CAPS Take by mouth.     No current facility-administered medications for this visit.    Allergies:   Patient has no known allergies.    Social History:  The patient  reports that he has never smoked. His smokeless tobacco use includes snuff. He reports current alcohol use. He reports that he does not use drugs.   Family History:  The patient's family history includes Alzheimer's disease in his mother; Cancer in his father; Heart attack in his mother.    ROS:  Please see the history of present illness.   Otherwise, review of systems are positive for intermittent dizziness- improving .   All other systems are reviewed and negative.    PHYSICAL EXAM: VS:  BP 126/70   Pulse 71   Ht 5\' 5"  (1.651 m)   Wt 157 lb (71.2 kg)   SpO2 97%   BMI 26.13 kg/m  , BMI Body mass index is 26.13 kg/m. GEN: Well nourished, well developed, in no acute distress  HEENT: normal  Neck: no JVD, carotid bruits, or masses Cardiac: RRR; no murmurs, rubs, or gallops,no edema  Respiratory:  clear to auscultation bilaterally, normal work of breathing GI: soft, nontender, nondistended, + BS MS: no deformity or atrophy  Skin: warm and dry, no rash Neuro:  Strength and sensation are intact Psych: euthymic mood, full affect   EKG: NSR, IRBBB   Recent Labs: 06/22/2019: ALT 14 12/11/2019: BUN 15; Creatinine, Ser 0.86; Hemoglobin 12.7; Platelets 280; Potassium 4.9; Sodium 141; TSH 1.670   Lipid Panel    Component Value Date/Time   HDL 49 05/20/2014 0000   LDLCALC 59 05/20/2014 0000     Other  studies Reviewed: Additional studies/ records that were reviewed today with results demonstrating: labs reviewed.   ASSESSMENT AND PLAN:  1. CAD: No angina.  No bleeding issues with aspirin.  Continue aggressive secondary prevention. 2. Atrial flutter: No palpitations.  No AFib on monitor.   3. DOE: Resolved.   4. Hyperlipidemia: Checked at [place]- stable.  5. Dizziness/orthostasis.  Improved.  Stay better hydrated, use midodrine prn.   Hydrate before  exercise.  He has decreased sodas and drinks 1 cup of coffee a day.    Current medicines are reviewed at length with the patient today.  The patient concerns regarding his medicines were addressed.  The following changes have been made:  No change  Labs/ tests ordered today include:  No orders of the defined types were placed in this encounter.   Recommend 150 minutes/week of aerobic exercise Low fat, low carb, high fiber diet recommended  Disposition:   FU in 6 months   Signed, Larae Grooms, MD  01/14/2020 9:08 AM    [facility] [address], [place], [place]  [address] Phone: [phone]; Fax: [phone]

## 2020-01-14 ENCOUNTER — Other Ambulatory Visit: Payer: Self-pay

## 2020-01-14 ENCOUNTER — Ambulatory Visit: Payer: PPO | Admitting: Interventional Cardiology

## 2020-01-14 ENCOUNTER — Encounter: Payer: Self-pay | Admitting: Interventional Cardiology

## 2020-01-14 VITALS — BP 126/70 | HR 71 | Ht 65.0 in | Wt 157.0 lb

## 2020-01-14 DIAGNOSIS — E785 Hyperlipidemia, unspecified: Secondary | ICD-10-CM | POA: Diagnosis not present

## 2020-01-14 DIAGNOSIS — R42 Dizziness and giddiness: Secondary | ICD-10-CM | POA: Diagnosis not present

## 2020-01-14 DIAGNOSIS — I4892 Unspecified atrial flutter: Secondary | ICD-10-CM

## 2020-01-14 DIAGNOSIS — I951 Orthostatic hypotension: Secondary | ICD-10-CM

## 2020-01-14 DIAGNOSIS — Z951 Presence of aortocoronary bypass graft: Secondary | ICD-10-CM | POA: Diagnosis not present

## 2020-01-14 DIAGNOSIS — I2510 Atherosclerotic heart disease of native coronary artery without angina pectoris: Secondary | ICD-10-CM

## 2020-01-14 MED ORDER — MIDODRINE HCL 2.5 MG PO TABS: 2.5000 mg | ORAL_TABLET | Freq: Three times a day (TID) | ORAL | 1 refills | Status: DC | PRN

## 2020-01-14 NOTE — Patient Instructions (Signed)
Medication Instructions:  Your physician has recommended you make the following change in your medication:   You may take your midodrine three times a day as needed  *If you need a refill on your cardiac medications before your next appointment, please call your pharmacy*   Lab Work: None  If you have labs (blood work) drawn today and your tests are completely normal, you will receive your results only by:  [place] (if you have MyChart) OR  A paper copy in the mail If you have any lab test that is abnormal or we need to change your treatment, we will call you to review the results.   Testing/Procedures: None   Follow-Up: At [facility], you and your health needs are our priority.  As part of our continuing mission to provide you with exceptional heart care, we have created designated Provider Care Teams.  These Care Teams include your primary Cardiologist (physician) and Advanced Practice Providers (APPs -  Physician Assistants and Nurse Practitioners) who all work together to provide you with the care you need, when you need it.  We recommend signing up for the patient portal called "MyChart".  Sign up information is provided on this After Visit Summary.  MyChart is used to connect with patients for Virtual Visits (Telemedicine).  Patients are able to view lab/test results, encounter notes, upcoming appointments, etc.  Non-urgent messages can be sent to your provider as well.   To learn more about what you can do with MyChart, go to [email or web address].    Your next appointment:   6 month(s)  The format for your next appointment:   In Person  Provider:   You may see Larae Grooms, MD or one of the following Advanced Practice Providers on your designated Care Team:    Melina Copa, PA-C  Ermalinda Barrios, PA-C    Other Instructions None

## 2020-01-27 DIAGNOSIS — M1712 Unilateral primary osteoarthritis, left knee: Secondary | ICD-10-CM | POA: Diagnosis not present

## 2020-01-27 DIAGNOSIS — M25561 Pain in right knee: Secondary | ICD-10-CM | POA: Insufficient documentation

## 2020-01-27 DIAGNOSIS — M25562 Pain in left knee: Secondary | ICD-10-CM | POA: Diagnosis not present

## 2020-05-02 DIAGNOSIS — M1712 Unilateral primary osteoarthritis, left knee: Secondary | ICD-10-CM | POA: Diagnosis not present

## 2020-05-26 DIAGNOSIS — M1712 Unilateral primary osteoarthritis, left knee: Secondary | ICD-10-CM | POA: Diagnosis not present

## 2020-05-31 NOTE — Progress Notes (Signed)
Subjective:   Donald [name] is a 75 y.o. male who presents for Medicare Annual/Subsequent preventive examination.  Review of Systems     Cardiac Risk Factors include: advanced age (>18men, >48 women);dyslipidemia;male gender     Objective:    Today's Vitals   06/01/20 1407  BP: 112/68  Pulse: 80  Resp: 16  Temp: (!) 96.9 F (36.1 C)  TempSrc: Temporal  SpO2: 98%  Weight: 160 lb 1.6 oz (72.6 kg)  Height: 5\' 5"  (1.651 m)   Body mass index is 26.64 kg/m.  Advanced Directives 06/01/2020 02/04/2019 02/02/2019 08/10/2016 08/11/2015 09/09/2014  Does Patient Have a Medical Advance Directive? Yes Yes Yes Yes No No  Type of Paramedic of [place];Living will [place];Living will [place];Living will [place];Living will - -  Does patient want to make changes to medical advance directive? - No - Patient declined No - Patient declined No - Patient declined - -  Copy of [place] in Chart? Yes - validated most recent copy scanned in chart (See row information) Yes - validated most recent copy scanned in chart (See row information) No - copy requested No - copy requested - -  Would patient like information on creating a medical advance directive? - - - - No - patient declined information No - patient declined information    Current Medications (verified) Outpatient Encounter Medications as of 06/01/2020  Medication Sig  . aspirin 81 MG chewable tablet Chew 81 mg by mouth daily.  Marland Kitchen atorvastatin (LIPITOR) 40 MG tablet Take 40 mg by mouth at bedtime.   . Multiple Vitamin (MULTIVITAMIN ADULT PO) Take by mouth.  . Omega-3 Fatty Acids (FISH OIL) 1000 MG CAPS Take by mouth.  . Red Yeast Rice 600 MG CAPS Take by mouth.  . zinc gluconate 50 MG tablet Take 50 mg by mouth daily.  . midodrine (PROAMATINE) 2.5 MG tablet Take 1 tablet (2.5 mg total) by mouth 3 (three) times daily with meals as needed.  (Patient not taking: Reported on 06/01/2020)   No facility-administered encounter medications on file as of 06/01/2020.    Allergies (verified) Patient has no known allergies.   History: Past Medical History:  Diagnosis Date  . Allergic rhinitis   . BPH (benign prostatic hyperplasia)   . Chicken pox   . Coronary artery disease    a. MI 2003 with CABG.  . Hematuria   . History of basal cell carcinoma (BCC) excision    left upper back, right chest area  . History of colon polyps   . History of MI (myocardial infarction) 08/04/2001   s/p  cabg  . Hydronephrosis, right   . Hypertension   . HYPERTENSION, MILD 05/18/2009   Qualifier: Diagnosis of  By: Rayann Heman, MD, Jeneen Rinks    . Incomplete right bundle branch block (RBBB)   . 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 Osteoarthritis of knee 05/13/2017  . Renal cell carcinoma ([place]) 02/04/2019   Partial nephrectomyKIDNEY, LEFT, PARTIAL NEPHRECTOMY:   . Right ureteral stone   . S/P ablation of atrial flutter    followed by dr Rayann Heman (EP cardiolgoist)  ablation 04-17-2004  and by dr allred 02-10- 2011 was successful  . S/P CABG x 5 08-05-2001   in [place] , [place]   LIMA to LAD,  seqVG to Diagonal,  SeqVG to OM,  seqVG to PDA and PLA  . Seasonal allergies   .  Sinus bradycardia   . Wears glasses    Past Surgical History:  Procedure Laterality Date  . CARDIAC CATHETERIZATION  08-04-2001  in [place], [place]   severe 3 vessel cad  . CARDIAC ELECTROPHYSIOLOGY MAPPING AND ABLATION  x2  04-17-2004 [place], [place]);  04-14-2009 by dr allred   per dr allred report-- successful ablation clockwise isthmus-dependent right atrial flutter along the usual cavotricupid isthmus;  complete bidirectionl isthmus block achieved  . CORONARY ARTERY BYPASS GRAFT  08-06-2003  in [place], [place]   x5-- LIMA - LAD,  seqVG to Diagonal, OM, PDA, and PLA  . CYSTOSCOPY WITH RETROGRADE PYELOGRAM, URETEROSCOPY AND STENT PLACEMENT Right 08/10/2016   Procedure: CYSTOSCOPY WITH  RETROGRADE PYELOGRAM, URETEROSCOPY AND STENT PLACEMENT;  Surgeon: Alexis Frock, MD;  Location: [facility];  Service: Urology;  Laterality: Right;  . FIXATION RIGHT WRIST Right 2011   stability for arthritis  . INGUINAL HERNIA REPAIR Right 08-08-2015   [facility]- health care at [place]  . ROBOTIC ASSITED PARTIAL NEPHRECTOMY Left 02/04/2019   Procedure: XI ROBOTIC ASSITED LEFT PARTIAL NEPHRECTOMY; LEFT PARTIAL ADRENALECTOMY;  Surgeon: Alexis Frock, MD;  [facility];  Service: Urology;  Laterality: Left;  3 HRS  . ROTATOR CUFF REPAIR Right 2004;  2007  . WISDOM TOOTH EXTRACTION     Family History  Problem Relation Age of Onset  . Heart attack Mother   . Alzheimer's disease Mother   . Cancer Father        throat cancer, chewing tobacco  . Hypertension Neg Hx    Social History   Socioeconomic History  . Marital status: Married    Spouse name: Not on file  . Number of children: Not on file  . Years of education: Not on file  . Highest education level: Not on file  Occupational History  . Occupation: retired  Tobacco Use  . Smoking status: Never Smoker  . Smokeless tobacco: Current User    Types: Snuff  . Tobacco comment: 08-02-2016 ~ dip tobacco for 30 yrs  Vaping Use  . Vaping Use: Never used  Substance and Sexual Activity  . Alcohol use: Yes    Comment: occ.   . Drug use: No  . Sexual activity: Not Currently    Partners: Female  Other Topics Concern  . 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 Difficulty of Paying Living Expenses: Not hard at  all  Food Insecurity: No Food Insecurity  . Worried About Charity fundraiser in the Last Year: Never true  . Ran Out of Food in the Last Year: Never true  Transportation Needs: No Transportation Needs  . Lack of Transportation (Medical): No  . Lack of Transportation (Non-Medical): No  Physical Activity: Sufficiently Active  . Days of Exercise per Week: 4 days  . Minutes of Exercise per Session: 60 min  Stress: No Stress Concern Present  . Feeling of  Stress : Not at all  Social Connections: Moderately Isolated  . Frequency of Communication with Friends and Family: More than three times a week  . Frequency of Social Gatherings with Friends and Family: More than three times a week  . Attends Religious Services: Never  . Active Member of Clubs or Organizations: No  . Attends Archivist Meetings: Never  . Marital Status: Married    Tobacco Counseling Ready to quit: Not Answered Counseling given: Not Answered Comment: 08-02-2016 ~ dip tobacco for 30 yrs   Clinical Intake:  Pre-visit preparation completed: Yes  Pain : No/denies pain     Nutritional Status: BMI 25 -29 Overweight Nutritional Risks: Unintentional weight loss (pt states he has lost about 15 pounds in the past year or two-states he cannot gain any weight) Diabetes: No  How often do you need to have someone help you when you read instructions, pamphlets, or other written materials from your doctor or pharmacy?: 1 - Never  Diabetic?No  Interpreter Needed?: No  Information entered by :: Caroleen Hamman LPn   Activities of Daily Living In your present state of health, do you have any difficulty performing the following activities: 06/01/2020  Hearing? N  Vision? N  Difficulty concentrating or making decisions? Y  Comment sometimes  Walking or climbing stairs? N  Dressing or bathing? N  Doing errands, shopping? N  Preparing Food and eating ? N  Using the Toilet? N  In the past six months, have you  accidently leaked urine? N  Do you have problems with loss of bowel control? N  Managing your Medications? N  Managing your Finances? N  Housekeeping or managing your Housekeeping? N  Some recent data might be hidden    Patient Care Team: Ma Hillock, DO as PCP - General (Family Medicine) Jettie Booze, MD as PCP - Cardiology (Cardiology) Thompson Grayer, MD as PCP - Electrophysiology (Cardiology) Ulyses Southward., MD as Consulting Physician (Urology) Jettie Booze, MD as Consulting Physician (Cardiology) Alexis Frock, MD as Consulting Physician (Urology) Jarome Matin, MD as Consulting Physician (Dermatology)  Indicate any recent Medical Services you may have received from other than [facility] providers in the past year (date may be approximate).     Assessment:   This is a routine wellness examination for [place].  Hearing/Vision screen  Hearing Screening   125Hz  250Hz  500Hz  1000Hz  2000Hz  3000Hz  4000Hz  6000Hz  8000Hz   Right ear:           Left ear:           Comments: No issues  Vision Screening Comments: Wears glasses [facility]  Dietary issues and exercise activities discussed: Current Exercise Habits: Home exercise routine, Type of exercise: strength training/weights, Time (Minutes): 60, Frequency (Times/Week): 4, Weekly Exercise (Minutes/Week): 240, Intensity: Mild, Exercise limited by: None identified  Goals    . Patient Stated     Drink more water & stay active      Depression Screen PHQ 2/9 Scores 06/01/2020 01/12/2019 09/09/2014  PHQ - 2 Score 0 0 0    Fall Risk Fall Risk  06/01/2020 10/02/2018 09/09/2014  Falls in the past year? 0 0 No  Comment - Emmi Telephone Survey: data to providers prior to load -  Number falls in past yr: 0 - -  Injury with Fall? 0 - -  Follow up Falls prevention discussed - -    FALL RISK PREVENTION PERTAINING TO THE HOME:  Any stairs in or around  the home? No  Home free of loose throw rugs in walkways,  pet beds, electrical cords, etc? Yes  Adequate lighting in your home to reduce risk of falls? Yes   ASSISTIVE DEVICES UTILIZED TO PREVENT FALLS:  Life alert? No  Use of a cane, walker or w/c? No  Grab bars in the bathroom? Yes  Shower chair or bench in shower? No  Elevated toilet seat or a handicapped toilet? No   TIMED UP AND GO:  Was the test performed? Yes .  Length of time to ambulate 10 feet: 10 sec.   Gait steady and fast without use of assistive device  Cognitive Function:Normal cognitive status assessed by direct observation by this Nurse Health Advisor. No abnormalities found.       6CIT Screen 06/01/2020  What Year? 0 points  What month? 0 points  What time? 0 points  Count back from 20 0 points  Months in reverse 0 points  Repeat phrase 0 points  Total Score 0    Immunizations Immunization History  Administered Date(s) Administered  . Fluad Quad(high Dose 65+) 12/19/2018, 12/14/2019  . Influenza, High Dose Seasonal PF 12/27/2016  . Influenza,inj,Quad PF,6+ Mos 01/12/2014  . Influenza,inj,quad, With Preservative 12/03/2016, 12/17/2017  . Influenza-Unspecified 12/19/2014  . PFIZER(Purple Top)SARS-COV-2 Vaccination 04/30/2019, 05/20/2019  . Pneumococcal Conjugate-13 09/24/2013  . Pneumococcal-Unspecified 12/03/2016  . Td 06/11/2016  . Td (Adult) 06/11/2016  . Zoster Recombinat (Shingrix) 11/12/2019, 02/11/2020    TDAP status: Up to date  Flu Vaccine status: Up to date  Pneumococcal vaccine status: Up to date  Covid-19 vaccine status: Completed vaccines per patient-date of booster unknown  Qualifies for Shingles Vaccine? No   Zostavax completed No   Shingrix Completed?: Yes  Screening Tests Health Maintenance  Topic Date Due  . Hepatitis C Screening  Never done  . COVID-19 Vaccine (3 - Pfizer risk 4-dose series) 06/17/2019  . TETANUS/TDAP  06/12/2026  . INFLUENZA VACCINE  Completed  . PNA vac Low Risk Adult  Completed  . HPV VACCINES  Aged Out     Health Maintenance  Health Maintenance Due  Topic Date Due  . Hepatitis C Screening  Never done  . COVID-19 Vaccine (3 - Pfizer risk 4-dose series) 06/17/2019    Colorectal cancer screening: No longer required.   Lung Cancer Screening: (Low Dose CT Chest recommended if Age 45-80 years, 30 pack-year currently smoking OR have quit w/in 15years.) does not qualify.    Additional Screening:  Hepatitis C Screening: does qualify  Vision Screening: Recommended annual ophthalmology exams for early detection of glaucoma and other disorders of the eye. Is the patient up to date with their annual eye exam?  Yes  Who is the provider or what [facility] the [facility] the [facility]   Dental Screening: Recommended annual dental exams for proper oral hygiene  Community Resource Referral / Chronic Care Management: CRR required this visit?  No   CCM required this visit?  No      Plan:     I have personally reviewed and noted the following in the patient's chart:   . Medical and social history . Use of alcohol, tobacco or illicit drugs  . Current medications and supplements . Functional ability and status . Nutritional status . Physical activity . Advanced directives . List of other physicians . Hospitalizations, surgeries, and ER visits in previous 12 months . Vitals . Screenings to include cognitive, depression, and  falls . Referrals and appointments  In addition, I have reviewed and discussed with patient certain preventive protocols, quality metrics, and best practice recommendations. A written personalized care plan for preventive services as well as general preventive health recommendations were provided to patient.     Marta Antu, LPN   [id number]  Nurse Health Advisor  Nurse Notes: None

## 2020-06-01 ENCOUNTER — Other Ambulatory Visit: Payer: Self-pay

## 2020-06-01 ENCOUNTER — Ambulatory Visit (INDEPENDENT_AMBULATORY_CARE_PROVIDER_SITE_OTHER): Payer: Medicare HMO

## 2020-06-01 VITALS — BP 112/68 | HR 80 | Temp 96.9°F | Resp 16 | Ht 65.0 in | Wt 160.1 lb

## 2020-06-01 DIAGNOSIS — Z0000 Encounter for general adult medical examination without abnormal findings: Secondary | ICD-10-CM | POA: Diagnosis not present

## 2020-06-01 NOTE — Patient Instructions (Signed)
Donald [name] , Thank you for taking time to come for your Medicare Wellness Visit. I appreciate your ongoing commitment to your health goals. Please review the following plan we discussed and let me know if I can assist you in the future.   Screening recommendations/referrals: Colonoscopy: No longer required Recommended yearly ophthalmology/optometry visit for glaucoma screening and checkup Recommended yearly dental visit for hygiene and checkup  Vaccinations: Influenza vaccine: Up to date Pneumococcal vaccine: Completed vaccines Tdap vaccine: Up to date-Due-06/12/2026 Shingles vaccine: Completed vaccines Covid-19: Completed vaccines  Advanced directives: Copy in chart  Conditions/risks identified:See problem list  Next appointment: Follow up in one year for your annual wellness visit. 06/07/2021 @ 3:00  Preventive Care 65 Years and Older, Male Preventive care refers to lifestyle choices and visits with your health care provider that can promote health and wellness. What does preventive care include?  A yearly physical exam. This is also called an annual well check.  Dental exams once or twice a year.  Routine eye exams. Ask your health care provider how often you should have your eyes checked.  Personal lifestyle choices, including:  Daily care of your teeth and gums.  Regular physical activity.  Eating a healthy diet.  Avoiding tobacco and drug use.  Limiting alcohol use.  Practicing safe sex.  Taking low doses of aspirin every day.  Taking vitamin and mineral supplements as recommended by your health care provider. What happens during an annual well check? The services and screenings done by your health care provider during your annual well check will depend on your age, overall health, lifestyle risk factors, and family history of disease. Counseling  Your health care provider may ask you questions about your:  Alcohol use.  Tobacco use.  Drug use.  Emotional  well-being.  Home and relationship well-being.  Sexual activity.  Eating habits.  History of falls.  Memory and ability to understand (cognition).  Work and work Statistician. Screening  You may have the following tests or measurements:  Height, weight, and BMI.  Blood pressure.  Lipid and cholesterol levels. These may be checked every 5 years, or more frequently if you are over 71 years old.  Skin check.  Lung cancer screening. You may have this screening every year starting at age 6 if you have a 30-pack-year history of smoking and currently smoke or have quit within the past 15 years.  Fecal occult blood test (FOBT) of the stool. You may have this test every year starting at age 52.  Flexible sigmoidoscopy or colonoscopy. You may have a sigmoidoscopy every 5 years or a colonoscopy every 10 years starting at age 19.  Prostate cancer screening. Recommendations will vary depending on your family history and other risks.  Hepatitis C blood test.  Hepatitis B blood test.  Sexually transmitted disease (STD) testing.  Diabetes screening. This is done by checking your blood sugar (glucose) after you have not eaten for a while (fasting). You may have this done every 1-3 years.  Abdominal aortic aneurysm (AAA) screening. You may need this if you are a current or former smoker.  Osteoporosis. You may be screened starting at age 33 if you are at high risk. Talk with your health care provider about your test results, treatment options, and if necessary, the need for more tests. Vaccines  Your health care provider may recommend certain vaccines, such as:  Influenza vaccine. This is recommended every year.  Tetanus, diphtheria, and acellular pertussis (Tdap, Td) vaccine. You may need  a Td booster every 10 years.  Zoster vaccine. You may need this after age 53.  Pneumococcal 13-valent conjugate (PCV13) vaccine. One dose is recommended after age 68.  Pneumococcal  polysaccharide (PPSV23) vaccine. One dose is recommended after age 20. Talk to your health care provider about which screenings and vaccines you need and how often you need them. This information is not intended to replace advice given to you by your health care provider. Make sure you discuss any questions you have with your health care provider. Document Released: 03/18/2015 Document Revised: 11/09/2015 Document Reviewed: 12/21/2014 Elsevier Interactive Patient Education  2017 [employer] Prevention in the Home Falls can cause injuries. They can happen to people of all ages. There are many things you can do to make your home safe and to help prevent falls. What can I do on the outside of my home?  Regularly fix the edges of walkways and driveways and fix any cracks.  Remove anything that might make you trip as you walk through a door, such as a raised step or threshold.  Trim any bushes or trees on the path to your home.  Use bright outdoor lighting.  Clear any walking paths of anything that might make someone trip, such as rocks or tools.  Regularly check to see if handrails are loose or broken. Make sure that both sides of any steps have handrails.  Any raised decks and porches should have guardrails on the edges.  Have any leaves, snow, or ice cleared regularly.  Use sand or salt on walking paths during winter.  Clean up any spills in your garage right away. This includes oil or grease spills. What can I do in the bathroom?  Use night lights.  Install grab bars by the toilet and in the tub and shower. Do not use towel bars as grab bars.  Use non-skid mats or decals in the tub or shower.  If you need to sit down in the shower, use a plastic, non-slip stool.  Keep the floor dry. Clean up any water that spills on the floor as soon as it happens.  Remove soap buildup in the tub or shower regularly.  Attach bath mats securely with double-sided non-slip rug  tape.  Do not have throw rugs and other things on the floor that can make you trip. What can I do in the bedroom?  Use night lights.  Make sure that you have a light by your bed that is easy to reach.  Do not use any sheets or blankets that are too big for your bed. They should not hang down onto the floor.  Have a firm chair that has side arms. You can use this for support while you get dressed.  Do not have throw rugs and other things on the floor that can make you trip. What can I do in the kitchen?  Clean up any spills right away.  Avoid walking on wet floors.  Keep items that you use a lot in easy-to-reach places.  If you need to reach something above you, use a strong step stool that has a grab bar.  Keep electrical cords out of the way.  Do not use floor polish or wax that makes floors slippery. If you must use wax, use non-skid floor wax.  Do not have throw rugs and other things on the floor that can make you trip. What can I do with my stairs?  Do not leave any items on the  stairs.  Make sure that there are handrails on both sides of the stairs and use them. Fix handrails that are broken or loose. Make sure that handrails are as long as the stairways.  Check any carpeting to make sure that it is firmly attached to the stairs. Fix any carpet that is loose or worn.  Avoid having throw rugs at the top or bottom of the stairs. If you do have throw rugs, attach them to the floor with carpet tape.  Make sure that you have a light switch at the top of the stairs and the bottom of the stairs. If you do not have them, ask someone to add them for you. What else can I do to help prevent falls?  Wear shoes that:  Do not have high heels.  Have rubber bottoms.  Are comfortable and fit you well.  Are closed at the toe. Do not wear sandals.  If you use a stepladder:  Make sure that it is fully opened. Do not climb a closed stepladder.  Make sure that both sides of the  stepladder are locked into place.  Ask someone to hold it for you, if possible.  Clearly mark and make sure that you can see:  Any grab bars or handrails.  First and last steps.  Where the edge of each step is.  Use tools that help you move around (mobility aids) if they are needed. These include:  Canes.  Walkers.  Scooters.  Crutches.  Turn on the lights when you go into a dark area. Replace any light bulbs as soon as they burn out.  Set up your furniture so you have a clear path. Avoid moving your furniture around.  If any of your floors are uneven, fix them.  If there are any pets around you, be aware of where they are.  Review your medicines with your doctor. Some medicines can make you feel dizzy. This can increase your chance of falling. Ask your doctor what other things that you can do to help prevent falls. This information is not intended to replace advice given to you by your health care provider. Make sure you discuss any questions you have with your health care provider. Document Released: 12/16/2008 Document Revised: 07/28/2015 Document Reviewed: 03/26/2014 Elsevier Interactive Patient Education  2017 [employer].

## 2020-06-18 DIAGNOSIS — M25562 Pain in left knee: Secondary | ICD-10-CM | POA: Diagnosis not present

## 2020-06-23 DIAGNOSIS — M25562 Pain in left knee: Secondary | ICD-10-CM | POA: Diagnosis not present

## 2020-06-24 ENCOUNTER — Telehealth: Payer: Self-pay | Admitting: Family Medicine

## 2020-06-24 NOTE — Telephone Encounter (Signed)
Please assist patient with scheduling, thanks. 

## 2020-06-24 NOTE — Telephone Encounter (Addendum)
Form received and on my desk area. He will need surgical clearance appt. Pt was last seen 06/22/19. LM for pt to return call regarding scheduling appt.

## 2020-06-24 NOTE — Telephone Encounter (Signed)
Received faxed surgical clearance form from [facility]. Placed in Dr. Lucita Lora front office inbox to be signed.

## 2020-06-27 DIAGNOSIS — L812 Freckles: Secondary | ICD-10-CM | POA: Diagnosis not present

## 2020-06-27 DIAGNOSIS — C44519 Basal cell carcinoma of skin of other part of trunk: Secondary | ICD-10-CM | POA: Diagnosis not present

## 2020-06-27 DIAGNOSIS — D485 Neoplasm of uncertain behavior of skin: Secondary | ICD-10-CM | POA: Diagnosis not present

## 2020-06-27 DIAGNOSIS — L570 Actinic keratosis: Secondary | ICD-10-CM | POA: Diagnosis not present

## 2020-06-27 DIAGNOSIS — C44311 Basal cell carcinoma of skin of nose: Secondary | ICD-10-CM | POA: Diagnosis not present

## 2020-06-27 DIAGNOSIS — L821 Other seborrheic keratosis: Secondary | ICD-10-CM | POA: Diagnosis not present

## 2020-06-27 DIAGNOSIS — Z85828 Personal history of other malignant neoplasm of skin: Secondary | ICD-10-CM | POA: Diagnosis not present

## 2020-06-27 NOTE — Telephone Encounter (Signed)
Patient is scheduled for appt for surgery clearance with PCP on 07/04/20.

## 2020-06-29 DIAGNOSIS — M109 Gout, unspecified: Secondary | ICD-10-CM | POA: Insufficient documentation

## 2020-06-29 DIAGNOSIS — H2513 Age-related nuclear cataract, bilateral: Secondary | ICD-10-CM

## 2020-06-29 DIAGNOSIS — M79673 Pain in unspecified foot: Secondary | ICD-10-CM | POA: Insufficient documentation

## 2020-06-29 DIAGNOSIS — I25810 Atherosclerosis of coronary artery bypass graft(s) without angina pectoris: Secondary | ICD-10-CM | POA: Insufficient documentation

## 2020-06-29 DIAGNOSIS — I219 Acute myocardial infarction, unspecified: Secondary | ICD-10-CM | POA: Insufficient documentation

## 2020-06-29 DIAGNOSIS — E291 Testicular hypofunction: Secondary | ICD-10-CM | POA: Insufficient documentation

## 2020-06-29 HISTORY — DX: Age-related nuclear cataract, bilateral: H25.13

## 2020-07-04 ENCOUNTER — Other Ambulatory Visit: Payer: Self-pay

## 2020-07-04 ENCOUNTER — Encounter: Payer: Self-pay | Admitting: Family Medicine

## 2020-07-04 ENCOUNTER — Ambulatory Visit (INDEPENDENT_AMBULATORY_CARE_PROVIDER_SITE_OTHER): Payer: Medicare HMO | Admitting: Family Medicine

## 2020-07-04 VITALS — BP 109/67 | HR 72 | Temp 98.0°F | Ht 65.0 in | Wt 159.0 lb

## 2020-07-04 DIAGNOSIS — I25810 Atherosclerosis of coronary artery bypass graft(s) without angina pectoris: Secondary | ICD-10-CM | POA: Diagnosis not present

## 2020-07-04 DIAGNOSIS — Z01818 Encounter for other preprocedural examination: Secondary | ICD-10-CM | POA: Diagnosis not present

## 2020-07-04 DIAGNOSIS — E782 Mixed hyperlipidemia: Secondary | ICD-10-CM

## 2020-07-04 DIAGNOSIS — C642 Malignant neoplasm of left kidney, except renal pelvis: Secondary | ICD-10-CM | POA: Diagnosis not present

## 2020-07-04 DIAGNOSIS — I252 Old myocardial infarction: Secondary | ICD-10-CM

## 2020-07-04 DIAGNOSIS — I4892 Unspecified atrial flutter: Secondary | ICD-10-CM

## 2020-07-04 DIAGNOSIS — R739 Hyperglycemia, unspecified: Secondary | ICD-10-CM | POA: Diagnosis not present

## 2020-07-04 DIAGNOSIS — I2510 Atherosclerotic heart disease of native coronary artery without angina pectoris: Secondary | ICD-10-CM | POA: Diagnosis not present

## 2020-07-04 NOTE — Patient Instructions (Signed)
  Great to see you today.  I have refilled the medication(s) we provide.   If labs were collected, we will inform you of lab results once received either by echart message or telephone call.   - echart message- for normal results that have been seen by the patient already.   - telephone call: abnormal results or if patient has not viewed results in their echart.  Make sure to schedule with your cardiologist for clearance as well.

## 2020-07-04 NOTE — Progress Notes (Signed)
This visit occurred during the SARS-CoV-2 public health emergency.  Safety protocols were in place, including screening questions prior to the visit, additional usage of staff PPE, and extensive cleaning of exam room while observing appropriate contact time as indicated for disinfecting solutions.    Donald [name] , 1945-12-20, 75 y.o., male MRN: [id number] Patient Care Team    Relationship Specialty Notifications Start End  Ma Hillock, DO PCP - General Family Medicine  12/11/18   Jettie Booze, MD PCP - Cardiology Cardiology Admissions 11/25/18   Thompson Grayer, MD PCP - Electrophysiology Cardiology Admissions 11/25/18   Ulyses Southward., MD Consulting Physician Urology  09/09/14   Jettie Booze, MD Consulting Physician Cardiology  09/09/14   Alexis Frock, MD Consulting Physician Urology  01/12/19   Jarome Matin, MD Consulting Physician Dermatology  01/12/19     Chief Complaint  Patient presents with  . surgical clearance     Subjective: Donald [name] is a 75 y.o. male present for surgical clearance  procedure:osteoarthritis/joint pain Indication:total knee arthropasty Anesthesia:General anesthesia.  Surgery type risk:   - Intermediate risk= orthopedics Prior anesthesia complications:n/a Family history of prior anesthesia complications: No history known Cardiac:    - h/o MI in the past s/p CABG, A.flutter, RBBB > cardio to eval for clearance.  Pulmonary: N/A Endocrine: Diabetes Obesity:Body mass index is 26.46 kg/m. 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 Age-related nuclear cataract, bilateral 06/29/2020  . Allergic rhinitis   . BPH (benign prostatic hyperplasia)   . Chicken pox   . Coronary artery disease    a. MI 2003 with CABG.  . Hematuria   . History of basal cell carcinoma (BCC) excision    left upper back, right chest area  . History of colon polyps   . History of MI (myocardial infarction) 08/04/2001   s/p  cabg  . Hydronephrosis, right   . HYPERTENSION, MILD 05/18/2009   Qualifier: Diagnosis of  By: Rayann Heman, MD, Jeneen Rinks    . Incomplete right bundle branch block (RBBB)   . 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 Osteoarthritis of knee 05/13/2017  . Renal cell carcinoma ([place]) 02/04/2019   Partial nephrectomyKIDNEY, LEFT, PARTIAL NEPHRECTOMY:   . Right ureteral stone   . S/P ablation of atrial flutter    followed by dr Rayann Heman (EP cardiolgoist)  ablation 04-17-2004  and by dr allred 02-10- 2011 was successful  . S/P CABG x 5 08-05-2001   in [place] , [place]   LIMA to LAD,  seqVG to Diagonal,  SeqVG to OM,  seqVG to PDA and PLA  . Seasonal allergies   . Sinus bradycardia   . Wears glasses    Past Surgical History:  Procedure Laterality Date  . CARDIAC CATHETERIZATION  08-04-2001  in [place], [place]   severe 3 vessel cad  . CARDIAC ELECTROPHYSIOLOGY MAPPING AND ABLATION  x2  04-17-2004 [place], [place]);  04-14-2009 by dr allred   per dr allred report-- successful ablation clockwise isthmus-dependent right atrial flutter along the usual cavotricupid isthmus;  complete bidirectionl isthmus block achieved  . CORONARY ARTERY BYPASS GRAFT  08-06-2003  in [place], [place]   x5-- LIMA - LAD,  seqVG to Diagonal, OM, PDA, and PLA  . CYSTOSCOPY WITH  RETROGRADE PYELOGRAM, URETEROSCOPY AND STENT PLACEMENT Right 08/10/2016   Procedure: CYSTOSCOPY WITH RETROGRADE PYELOGRAM, URETEROSCOPY AND STENT PLACEMENT;  Surgeon: Alexis Frock, MD;  [facility];  Service: Urology;  Laterality: Right;  . FIXATION RIGHT WRIST Right 2011   stability for arthritis  . INGUINAL HERNIA REPAIR Right 08-08-2015   [facility]- health care at [place]  . ROBOTIC ASSITED PARTIAL NEPHRECTOMY Left 02/04/2019   Procedure: XI ROBOTIC ASSITED LEFT PARTIAL NEPHRECTOMY; LEFT PARTIAL ADRENALECTOMY;  Surgeon: Alexis Frock, MD;  Location: [facility];  Service: Urology;  Laterality: Left;  3 HRS  . ROTATOR CUFF REPAIR Right 2004;  2007  . WISDOM TOOTH EXTRACTION     Family History  Problem Relation Age of Onset  . Heart attack Mother   . Alzheimer's disease Mother   . Cancer Father        throat cancer, chewing tobacco  . Hypertension Neg Hx    Allergies as of 07/04/2020   No Known Allergies     Medication List       Accurate as of Jul 04, 2020 11:59 PM. If you have any questions, ask your nurse or doctor.        acetaminophen 500 MG tablet Commonly known as: TYLENOL TAKE TWO TABLETS BY MOUTH THREE TIMES A DAY AS NEEDED   aspirin 81 MG chewable tablet Chew 81 mg by mouth daily.   atorvastatin 40 MG tablet Commonly known as: LIPITOR Take 40 mg by mouth at bedtime.   Fish Oil 1000 MG Caps Take by mouth.   midodrine 2.5 MG tablet Commonly known as: PROAMATINE Take 1 tablet (2.5 mg total) by mouth 3 (three) times daily with meals as needed.   MULTIVITAMIN ADULT PO Take by mouth.   Red Yeast Rice 600 MG Caps Take by mouth.   zinc gluconate 50 MG tablet Take 50 mg by mouth daily.       All past medical history, surgical history, allergies, family history, immunizations andmedications were updated in the EMR today and reviewed under the history and medication portions of their EMR.     ROS: Negative, with the exception  of above mentioned in HPI   Objective:  BP 109/67   Pulse 72   Temp 98 F (36.7 C) (Oral)   Ht 5' 5"  (1.651 m)   Wt 159 lb (72.1 kg)   SpO2 97%   BMI 26.46 kg/m  Body mass index is 26.46 kg/m. Gen: Afebrile. No acute distress. Nontoxic in appearance, well developed, well nourished.  HENT: AT. [place]. Bilateral TM visualized within normal limits. MMM, no oral lesions. Bilateral nares without erythema, swelling or drainage. Throat without erythema or exudates.  No cough.  No hoarseness. Eyes:Pupils Equal Round Reactive to light, Extraocular movements intact,  Conjunctiva without redness, discharge or icterus. Neck/lymp/endocrine: Supple, no lymphadenopathy CV: RRR, no edema Chest: CTAB, no wheeze or crackles. Good air movement, normal resp effort.  Abd: Soft. NTND. BS present.  No masses palpated. No rebound or guarding.  Skin: no rashes, purpura or petechiae.  Neuro: Normal gait. PERLA. EOMi. Alert. Oriented x3  Psych: Normal affect, dress and demeanor. Normal speech. Normal thought content and judgment. Results for orders placed or performed in visit on 07/04/20 (from the past 48 hour(s))  CBC     Status: Abnormal   Collection Time: 07/04/20  3:37 PM  Result Value Ref Range   WBC 7.4 3.8 - 10.8 Thousand/uL   RBC 4.24 4.20 - 5.80 Million/uL   Hemoglobin 12.5 (L) 13.2 - 17.1 g/dL   HCT 38.3 (L) 38.5 - 50.0 %   MCV 90.3 80.0 - 100.0 fL   MCH 29.5 27.0 - 33.0 pg   MCHC 32.6 32.0 - 36.0 g/dL   RDW 12.4 11.0 - 15.0 %   Platelets 237 140 - 400 Thousand/uL   MPV 11.4 7.5 - 12.5 fL  Comp Met (CMET)     Status: Abnormal   Collection Time: 07/04/20  3:37 PM  Result Value Ref Range   Glucose, Bld 101 (H) 65 - 99 mg/dL    Comment: .            Fasting reference interval . For someone without known diabetes, a glucose value between 100 and 125 mg/dL is consistent with prediabetes and should be confirmed with a follow-up test. .    BUN 19 7 - 25 mg/dL   Creat 0.98 0.70 - 1.18 mg/dL     Comment: For patients >1 years of age, the reference limit for Creatinine is approximately 13% higher for people identified as African-American. .    BUN/Creatinine Ratio NOT APPLICABLE 6 - 22 (calc)   Sodium 141 135 - 146 mmol/L   Potassium 4.3 3.5 - 5.3 mmol/L   Chloride 106 98 - 110 mmol/L   CO2 26 20 - 32 mmol/L   Calcium 9.3 8.6 - 10.3 mg/dL   Total Protein 6.5 6.1 - 8.1 g/dL   Albumin 3.9 3.6 - 5.1 g/dL   Globulin 2.6 1.9 - 3.7 g/dL (calc)   AG Ratio 1.5 1.0 - 2.5 (calc)   Total Bilirubin 0.5 0.2 - 1.2 mg/dL   Alkaline phosphatase (APISO) 68 35 - 144 U/L   AST 21 10 - 35 U/L   ALT 15 9 - 46 U/L  Hemoglobin A1c     Status: Abnormal   Collection Time: 07/04/20  3:37 PM  Result Value Ref Range   Hgb A1c MFr Bld 5.8 (H) <5.7 % of total Hgb    Comment: For someone without known diabetes, a hemoglobin  A1c value between 5.7% and 6.4% is consistent with prediabetes and should be confirmed with a  follow-up test. . For someone with known diabetes, a value <7% indicates that their diabetes is well controlled. A1c targets should be individualized based on duration of diabetes, age, comorbid conditions, and other considerations. . This assay result is consistent with an increased risk of diabetes. . Currently, no consensus exists regarding use of hemoglobin A1c for diagnosis of diabetes for children. .    Mean Plasma Glucose 120 mg/dL   eAG (mmol/L) 6.6 mmol/L    Assessment/Plan: Donald [name] is a 75 y.o. male present for OV for surgical clearance.  From a medical standpoint patient is likely a low  risk of complication for surgeries.  He has a significant cardiac history and will require cardiology clearance as well. atrial flutter/history of MI/CAD/arteriosclerosis Patient will need his cardiology to clear him as well given his cardiac history.  Elevated blood sugar - Hemoglobin A1c> 5.8 stable and diet controlled  Renal cell carcinoma of left kidney  (HCC)/anemia of chronic disease Function lab is normal status post left nephrectomy for renal cell carcinoma.  He has chronic, but stable anemia of chronic disease with a hemoglobin of 12.5.  No patient is free of risk when undergoing a procedure. The decision about whether to proceed with the operation belongs to the surgeon and the patient.   Reviewed expectations re: course of current medical issues.  Discussed self-management of symptoms.  Outlined signs and symptoms indicating need for more acute intervention.  Patient verbalized understanding and all questions were answered.  Patient received an After-Visit Summary.    Orders Placed This Encounter  Procedures  . CBC  . Comp Met (CMET)  . Hemoglobin A1c   No orders of the defined types were placed in this encounter.  Referral Orders  No referral(s) requested today     Note is dictated utilizing voice recognition software. Although note has been proof read prior to signing, occasional typographical errors still can be missed. If any questions arise, please do not hesitate to call for verification.   electronically signed by:  Howard Pouch, DO  [place]

## 2020-07-05 ENCOUNTER — Telehealth: Payer: Self-pay | Admitting: Family Medicine

## 2020-07-05 LAB — CBC
HCT: 38.3 % — ABNORMAL LOW (ref 38.5–50.0)
Hemoglobin: 12.5 g/dL — ABNORMAL LOW (ref 13.2–17.1)
MCH: 29.5 pg (ref 27.0–33.0)
MCHC: 32.6 g/dL (ref 32.0–36.0)
MCV: 90.3 fL (ref 80.0–100.0)
MPV: 11.4 fL (ref 7.5–12.5)
Platelets: 237 10*3/uL (ref 140–400)
RBC: 4.24 10*6/uL (ref 4.20–5.80)
RDW: 12.4 % (ref 11.0–15.0)
WBC: 7.4 10*3/uL (ref 3.8–10.8)

## 2020-07-05 LAB — COMPREHENSIVE METABOLIC PANEL
AG Ratio: 1.5 (calc) (ref 1.0–2.5)
ALT: 15 U/L (ref 9–46)
AST: 21 U/L (ref 10–35)
Albumin: 3.9 g/dL (ref 3.6–5.1)
Alkaline phosphatase (APISO): 68 U/L (ref 35–144)
BUN: 19 mg/dL (ref 7–25)
CO2: 26 mmol/L (ref 20–32)
Calcium: 9.3 mg/dL (ref 8.6–10.3)
Chloride: 106 mmol/L (ref 98–110)
Creat: 0.98 mg/dL (ref 0.70–1.18)
Globulin: 2.6 g/dL (calc) (ref 1.9–3.7)
Glucose, Bld: 101 mg/dL — ABNORMAL HIGH (ref 65–99)
Potassium: 4.3 mmol/L (ref 3.5–5.3)
Sodium: 141 mmol/L (ref 135–146)
Total Bilirubin: 0.5 mg/dL (ref 0.2–1.2)
Total Protein: 6.5 g/dL (ref 6.1–8.1)

## 2020-07-05 LAB — HEMOGLOBIN A1C
Hgb A1c MFr Bld: 5.8 % of total Hgb — ABNORMAL HIGH (ref <5.7)
Mean Plasma Glucose: 120 mg/dL
eAG (mmol/L): 6.6 mmol/L

## 2020-07-05 NOTE — Telephone Encounter (Signed)
Surgical clearance form completed.  Please fax to his surgeon Dr. Ardyth Gal with copy of office visit note and lab results.

## 2020-07-06 NOTE — Telephone Encounter (Signed)
OV notes printed 

## 2020-07-06 NOTE — Telephone Encounter (Signed)
Form faxed

## 2020-07-14 ENCOUNTER — Telehealth: Payer: Self-pay | Admitting: *Deleted

## 2020-07-14 ENCOUNTER — Other Ambulatory Visit: Payer: Self-pay | Admitting: *Deleted

## 2020-07-14 DIAGNOSIS — C44311 Basal cell carcinoma of skin of nose: Secondary | ICD-10-CM | POA: Diagnosis not present

## 2020-07-14 NOTE — Telephone Encounter (Signed)
Pt is agreeable to pre op appt though states he is not in any rush and prefers his appt to be sometime the end of August. Pt has been scheduled to see Dr. Irish Lack 11/01/20 @ 2 pm. I will send clearance note to MD for appt. Will send FYI to requesting office pt prefers to wait until August for his pre op appt.

## 2020-07-14 NOTE — Telephone Encounter (Signed)
   [facility] Pre-operative Risk Assessment    Patient Name: Donald [name]  DOB: 1945-09-19  MRN: [id number]   HEARTCARE STAFF: - Please ensure there is not already an duplicate clearance open for this procedure. - Under Visit Info/Reason for Call, type in Other and utilize the format Clearance MM/DD/YY or Clearance TBD. Do not use dashes or single digits. - If request is for dental extraction, please clarify the # of teeth to be extracted.  Request for surgical clearance:  1. What type of surgery is being performed? TOTAL KNEE ARTHROPLASTY   2. When is this surgery scheduled? TBD  3. What type of clearance is required (medical clearance vs. Pharmacy clearance to hold med vs. Both)? MEDICAL  4. Are there any medications that need to be held prior to surgery and how long? ASA    5. Practice name and name of physician performing surgery? [facility]; DR. JEFFREY BEANE   6. What is the office phone number? [phone]   7.   What is the office fax number? [place]  8.   Anesthesia type (None, local, MAC, general) ? GENERAL   Julaine Hua 07/14/2020, 10:27 AM  _________________________________________________________________   (provider comments below)

## 2020-07-14 NOTE — Telephone Encounter (Signed)
   Name: KAYDAN WONG  DOB: 11-16-1945  MRN: [id number]  Primary Cardiologist: Larae Grooms, MD  Chart reviewed as part of pre-operative protocol coverage. Because of ANTONYO HINDERER past medical history and time since last visit, he will require a follow-up visit in order to better assess preoperative cardiovascular risk. He was last seen 01/2020 and recommended for 6 month follow up at that time.  Pre-op covering staff: - Please schedule appointment and call patient to inform them. If patient already had an upcoming appointment within acceptable timeframe, please add "pre-op clearance" to the appointment notes so provider is aware. - Please contact requesting surgeon's office via preferred method (i.e, phone, fax) to inform them of need for appointment prior to surgery.  If applicable, this message will also be routed to pharmacy pool and/or primary cardiologist for input on holding anticoagulant/antiplatelet agent as requested below so that this information is available to the clearing provider at time of patient's appointment.   Loel Dubonnet, NP  07/14/2020, 1:25 PM

## 2020-07-15 NOTE — Telephone Encounter (Signed)
Appointment notes state preop.  Clearance will be reviewed at that time. Will remove from preop pool  Loel Dubonnet, NP

## 2020-08-22 DIAGNOSIS — C642 Malignant neoplasm of left kidney, except renal pelvis: Secondary | ICD-10-CM | POA: Diagnosis not present

## 2020-08-25 ENCOUNTER — Other Ambulatory Visit (HOSPITAL_COMMUNITY): Payer: Self-pay | Admitting: Urology

## 2020-08-25 ENCOUNTER — Ambulatory Visit (HOSPITAL_COMMUNITY)
Admission: RE | Admit: 2020-08-25 | Discharge: 2020-08-25 | Disposition: A | Discharge status: Home / Self Care | Payer: Medicare HMO | Source: Ambulatory Visit | Attending: Urology | Admitting: Urology

## 2020-08-25 ENCOUNTER — Other Ambulatory Visit: Payer: Self-pay

## 2020-08-25 DIAGNOSIS — I700 Atherosclerosis of aorta: Secondary | ICD-10-CM | POA: Diagnosis not present

## 2020-08-25 DIAGNOSIS — N200 Calculus of kidney: Secondary | ICD-10-CM | POA: Diagnosis not present

## 2020-08-25 DIAGNOSIS — C642 Malignant neoplasm of left kidney, except renal pelvis: Secondary | ICD-10-CM

## 2020-08-25 DIAGNOSIS — D3502 Benign neoplasm of left adrenal gland: Secondary | ICD-10-CM | POA: Diagnosis not present

## 2020-08-25 DIAGNOSIS — K8020 Calculus of gallbladder without cholecystitis without obstruction: Secondary | ICD-10-CM | POA: Diagnosis not present

## 2020-08-29 DIAGNOSIS — C642 Malignant neoplasm of left kidney, except renal pelvis: Secondary | ICD-10-CM | POA: Diagnosis not present

## 2020-08-29 DIAGNOSIS — N281 Cyst of kidney, acquired: Secondary | ICD-10-CM | POA: Diagnosis not present

## 2020-08-29 DIAGNOSIS — N202 Calculus of kidney with calculus of ureter: Secondary | ICD-10-CM | POA: Diagnosis not present

## 2020-09-22 NOTE — Progress Notes (Addendum)
This encounter was created in error - please disregard.ERROR. NO ORDERS WERE PLACED.

## 2020-09-22 NOTE — Telephone Encounter (Signed)
Our office received a duplicate clearance request for knee surgery. Please see notes where pt states to cardiology office he is not in a rush for his knee surgery and will wait until he see's the cardiologist (Dr. Irish Lack) 11/01/20. Our office will fax clearance over once the pt has been seen and has been cleared. I will send FYI to surgeon's office pt has appt 11/01/20 and wants to wait until he see's Dr. Irish Lack for pre op clearance.

## 2020-10-28 NOTE — Progress Notes (Signed)
Cardiology Office Note   Date:  11/01/2020   ID:  Donald [name], DOB 03-01-1946, MRN [id number]  PCP:  Ma Hillock, DO    No chief complaint on file.  CAD  Wt Readings from Last 3 Encounters:  11/01/20 159 lb (72.1 kg)  07/04/20 159 lb (72.1 kg)  06/01/20 160 lb 1.6 oz (72.6 kg)       History of Present Illness: Donald [name] is a 75 y.o. male  who has CAD, CABG in 2001and a h/o AFlutter. He had an ablation for atrial flutter in 2011, and several years before that in [place].  He is off of coumadin.    Kidney stone in 2018- Dr. Tresa Moore. He had a tumor removed from his left kidney in 2020.   Bradycardia in the 40-50s range over the past several years (2017-2021).   2021 monitor was reassuring.   Had orthostasis in 10/21.  Midodrine was started due to some BP readings in the 80s.    He did not use the midodrine as scheduled.  He forgot doses.    He thought he needed to drink more water as well. He cut down on sodas.  He lost weight.   He needs left knee surgery, TKR.    Denies : Chest pain. Dizziness. Leg edema. Nitroglycerin use. Orthopnea. Palpitations. Paroxysmal nocturnal dyspnea. Shortness of breath. Syncope.    Blood pressure resolved with just with staying hydrated and no need for the midodrine.  Orson Slick [phone] phone, [phone] (fax) is the surgery coordinator for Dr. Tonita Cong.    Past Medical History:  Diagnosis Date   Age-related nuclear cataract, bilateral 06/29/2020   Allergic rhinitis    BPH (benign prostatic hyperplasia)    Chicken pox    Coronary artery disease    a. MI 2003 with CABG.   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    Osteoarthritis of knee 05/13/2017   Renal cell carcinoma ([place]) 02/04/2019   Partial nephrectomyKIDNEY, LEFT, PARTIAL NEPHRECTOMY:    Right ureteral stone    S/P ablation of atrial flutter    followed by dr Rayann Heman ([facility] cardiolgoist)  ablation 04-17-2004  and by dr allred 02-10- 2011 was successful   S/P CABG x 5 08-05-2001   in [place] , [place]   LIMA to LAD,  seqVG to Diagonal,  SeqVG to OM,  seqVG to PDA and PLA   Seasonal allergies    Sinus bradycardia    Wears glasses     Past Surgical History:  Procedure Laterality Date   CARDIAC CATHETERIZATION  08-04-2001  in [place], [place]   severe 3 vessel cad   [place]  x2  04-17-2004 [place], [place]);  04-14-2009 by dr Rayann Heman   per dr allred report-- successful ablation clockwise isthmus-dependent right atrial flutter along the usual cavotricupid isthmus;  complete bidirectionl isthmus block achieved   CORONARY ARTERY BYPASS GRAFT  08-06-2003  in [place], [place]   x5-- LIMA - LAD,  seqVG to Diagonal, OM, PDA, and PLA  CYSTOSCOPY WITH RETROGRADE PYELOGRAM, URETEROSCOPY AND STENT PLACEMENT Right 08/10/2016   Procedure: CYSTOSCOPY WITH RETROGRADE PYELOGRAM, URETEROSCOPY AND STENT PLACEMENT;  Surgeon: Alexis Frock, MD;  Location: [facility];  Service: Urology;  Laterality: Right;   FIXATION RIGHT WRIST Right 2011   stability for arthritis   INGUINAL HERNIA REPAIR Right 08-08-2015   [facility]- [facility] care at [place] Left 02/04/2019   Procedure: XI ROBOTIC ASSITED LEFT PARTIAL NEPHRECTOMY; LEFT PARTIAL ADRENALECTOMY;  Surgeon: Alexis Frock, MD;  Location: [facility];  Service: Urology;  Laterality: Left;  3 HRS   ROTATOR CUFF REPAIR Right 2004;  2007   WISDOM TOOTH EXTRACTION       Current Outpatient Medications  Medication Sig Dispense Refill   aspirin 81 MG chewable tablet Chew 81 mg by mouth daily.     atorvastatin  (LIPITOR) 40 MG tablet Take 40 mg by mouth at bedtime.      ibuprofen (ADVIL) 800 MG tablet Take 800 mg by mouth as needed.     Multiple Vitamin (MULTIVITAMIN ADULT PO) Take by mouth.     Omega-3 Fatty Acids (FISH OIL) 1000 MG CAPS Take by mouth.     Red Yeast Rice 600 MG CAPS Take by mouth.     zinc gluconate 50 MG tablet Take 50 mg by mouth daily.     acetaminophen (TYLENOL) 500 MG tablet TAKE TWO TABLETS BY MOUTH THREE TIMES A DAY AS NEEDED (Patient not taking: Reported on 11/01/2020)     midodrine (PROAMATINE) 2.5 MG tablet Take 1 tablet (2.5 mg total) by mouth 3 (three) times daily with meals as needed. (Patient not taking: Reported on 11/01/2020) 90 tablet 1   No current facility-administered medications for this visit.    Allergies:   Patient has no known allergies.    Social History:  The patient  reports that he has never smoked. His smokeless tobacco use includes snuff. He reports current alcohol use. He reports that he does not use drugs.   Family History:  The patient's family history includes Alzheimer's disease in his mother; Cancer in his father; Heart attack in his mother.    ROS:  Please see the history of present illness.   Otherwise, review of systems are positive for knee pain.   All other systems are reviewed and negative.    PHYSICAL EXAM: VS:  BP 118/74   Pulse (!) 58   Ht '5\' 5"'$  (1.651 m)   Wt 159 lb (72.1 kg)   BMI 26.46 kg/m  , BMI Body mass index is 26.46 kg/m. GEN: Well nourished, well developed, in no acute distress HEENT: normal Neck: no JVD, carotid bruits, or masses Cardiac: RRR; no murmurs, rubs, or gallops,no edema  Respiratory:  clear to auscultation bilaterally, normal work of breathing GI: soft, nontender, nondistended, + BS MS: no deformity or atrophy Skin: warm and dry, no rash Neuro:  Strength and sensation are intact Psych: euthymic mood, full affect   EKG:   The ekg ordered today demonstrates sinus bradycardia, RBBB, no ST  changes   Recent Labs: 12/11/2019: TSH 1.670 07/04/2020: ALT 15; BUN 19; Creat 0.98; Hemoglobin 12.5; Platelets 237; Potassium 4.3; Sodium 141   Lipid Panel    Component Value Date/Time   HDL 49 05/20/2014 0000   LDLCALC 59 05/20/2014 0000     Other studies Reviewed: Additional studies/ records that were reviewed today with results demonstrating: labs reviewed; TC 120, normal Cr at the [place].  ASSESSMENT AND PLAN:  CAD/preop eval: No angina. no further cardiac tests are needed before surgery.  Continue aggressive seocondary prevention.  No bleeding issues.  Atrial flutter: No palpitations.  S/p ablation many years ago.  No recent AFib.  Hyperlipidemia: The current medical regimen is effective;  continue present plan and medications.  He admits to some dietary indiscretion.  DOE: resolved.  Dizziness/orthostasis: Stay well hydrated.    Current medicines are reviewed at length with the patient today.  The patient concerns regarding his medicines were addressed.  The following changes have been made:  No change  Labs/ tests ordered today include:  No orders of the defined types were placed in this encounter.   Recommend 150 minutes/week of aerobic exercise Low fat, low carb, high fiber diet recommended  Disposition:   FU in 1 year   Signed, Larae Grooms, MD  11/01/2020 2:31 PM    [facility] [address], [place], [place]  [address] Phone: [phone]; Fax: [phone]

## 2020-11-01 ENCOUNTER — Ambulatory Visit (INDEPENDENT_AMBULATORY_CARE_PROVIDER_SITE_OTHER): Payer: Medicare HMO | Admitting: Interventional Cardiology

## 2020-11-01 ENCOUNTER — Other Ambulatory Visit: Payer: Self-pay

## 2020-11-01 ENCOUNTER — Encounter: Payer: Self-pay | Admitting: Interventional Cardiology

## 2020-11-01 VITALS — BP 118/74 | HR 58 | Ht 65.0 in | Wt 159.0 lb

## 2020-11-01 DIAGNOSIS — Z951 Presence of aortocoronary bypass graft: Secondary | ICD-10-CM

## 2020-11-01 DIAGNOSIS — R42 Dizziness and giddiness: Secondary | ICD-10-CM

## 2020-11-01 DIAGNOSIS — I4892 Unspecified atrial flutter: Secondary | ICD-10-CM

## 2020-11-01 DIAGNOSIS — E785 Hyperlipidemia, unspecified: Secondary | ICD-10-CM | POA: Diagnosis not present

## 2020-11-01 DIAGNOSIS — I2510 Atherosclerotic heart disease of native coronary artery without angina pectoris: Secondary | ICD-10-CM

## 2020-11-01 NOTE — Patient Instructions (Signed)
Medication Instructions:  Your physician recommends that you continue on your current medications as directed. Please refer to the Current Medication list given to you today.  *If you need a refill on your cardiac medications before your next appointment, please call your pharmacy*   Lab Work: none If you have labs (blood work) drawn today and your tests are completely normal, you will receive your results only by: [place] (if you have MyChart) OR A paper copy in the mail If you have any lab test that is abnormal or we need to change your treatment, we will call you to review the results.   Testing/Procedures: none   Follow-Up: At [facility], you and your health needs are our priority.  As part of our continuing mission to provide you with exceptional heart care, we have created designated Provider Care Teams.  These Care Teams include your primary Cardiologist (physician) and Advanced Practice Providers (APPs -  Physician Assistants and Nurse Practitioners) who all work together to provide you with the care you need, when you need it.  We recommend signing up for the patient portal called "MyChart".  Sign up information is provided on this After Visit Summary.  MyChart is used to connect with patients for Virtual Visits (Telemedicine).  Patients are able to view lab/test results, encounter notes, upcoming appointments, etc.  Non-urgent messages can be sent to your provider as well.   To learn more about what you can do with MyChart, go to [email or web address].    Your next appointment:   12 month(s)  The format for your next appointment:   In Person  Provider:   You may see Larae Grooms, MD or one of the following Advanced Practice Providers on your designated Care Team:   Melina Copa, PA-C Ermalinda Barrios, PA-C   Other Instructions High-Fiber Eating Plan Fiber, also called dietary fiber, is a type of carbohydrate. It is found foods such as fruits, vegetables,  whole grains, and beans. A high-fiber diet can have many health benefits. Your health care provider may recommend a high-fiber diet to help: Prevent constipation. Fiber can make your bowel movements more regular. Lower your cholesterol. Relieve the following conditions: Inflammation of veins in the anus (hemorrhoids). Inflammation of specific areas of the digestive tract (uncomplicated diverticulosis). A problem of the large intestine, also called the colon, that sometimes causes pain and diarrhea (irritable bowel syndrome, or IBS). Prevent overeating as part of a weight-loss plan. Prevent heart disease, type 2 diabetes, and certain cancers. What are tips for following this plan? Reading food labels  Check the nutrition facts label on food products for the amount of dietary fiber. Choose foods that have 5 grams of fiber or more per serving. The goals for recommended daily fiber intake include: Men (age 49 or younger): 34-38 g. Men (over age 62): 28-34 g. Women (age 45 or younger): 25-28 g. Women (over age 54): 22-25 g. Your daily fiber goal is _____________ g. Shopping Choose whole fruits and vegetables instead of processed forms, such as apple juice or applesauce. Choose a wide variety of high-fiber foods such as avocados, lentils, oats, and kidney beans. Read the nutrition facts label of the foods you choose. Be aware of foods with added fiber. These foods often have high sugar and sodium amounts per serving. Cooking Use whole-grain flour for baking and cooking. Cook with brown rice instead of white rice. Meal planning Start the day with a breakfast that is high in fiber, such as a cereal that  contains 5 g of fiber or more per serving. Eat breads and cereals that are made with whole-grain flour instead of refined flour or white flour. Eat brown rice, bulgur wheat, or millet instead of white rice. Use beans in place of meat in soups, salads, and pasta dishes. Be sure that half of the  grains you eat each day are whole grains. General information You can get the recommended daily intake of dietary fiber by: Eating a variety of fruits, vegetables, grains, nuts, and beans. Taking a fiber supplement if you are not able to take in enough fiber in your diet. It is better to get fiber through food than from a supplement. Gradually increase how much fiber you consume. If you increase your intake of dietary fiber too quickly, you may have bloating, cramping, or gas. Drink plenty of water to help you digest fiber. Choose high-fiber snacks, such as berries, raw vegetables, nuts, and popcorn. What foods should I eat? Fruits Berries. Pears. Apples. Oranges. Avocado. Prunes and raisins. Dried figs. Vegetables Sweet potatoes. Spinach. Kale. Artichokes. Cabbage. Broccoli. Cauliflower.Green peas. Carrots. Squash. Grains Whole-grain breads. Multigrain cereal. Oats and oatmeal. Brown rice. Barley.Bulgur wheat. [place]. Quinoa. Bran muffins. Popcorn. Rye wafer crackers. Meats and other proteins Navy beans, kidney beans, and pinto beans. Soybeans. Split peas. Lentils. Nutsand seeds. Dairy Fiber-fortified yogurt. Beverages Fiber-fortified soy milk. Fiber-fortified orange juice. Other foods Fiber bars. The items listed above may not be a complete list of recommended foods and beverages. Contact a dietitian for more information. What foods should I avoid? Fruits Fruit juice. Cooked, strained fruit. Vegetables Fried potatoes. Canned vegetables. Well-cooked vegetables. Grains White bread. Pasta made with refined flour. White rice. Meats and other proteins Fatty cuts of meat. Fried chicken or fried fish. Dairy Milk. Yogurt. Cream cheese. Sour cream. Fats and oils Butters. Beverages Soft drinks. Other foods Cakes and pastries. The items listed above may not be a complete list of foods and beverages to avoid. Talk with your dietitian about what choices are best for you. Summary Fiber  is a type of carbohydrate. It is found in foods such as fruits, vegetables, whole grains, and beans. A high-fiber diet has many benefits. It can help to prevent constipation, lower blood cholesterol, aid weight loss, and reduce your risk of heart disease, diabetes, and certain cancers. Increase your intake of fiber gradually. Increasing fiber too quickly may cause cramping, bloating, and gas. Drink plenty of water while you increase the amount of fiber you consume. The best sources of fiber include whole fruits and vegetables, whole grains, nuts, seeds, and beans. This information is not intended to replace advice given to you by your health care provider. Make sure you discuss any questions you have with your healthcare provider. Document Revised: 06/25/2019 Document Reviewed: 06/25/2019 Elsevier Patient Education  2022 [employer].

## 2020-11-01 NOTE — Telephone Encounter (Signed)
Office note from 11/01/20 visit with Dr Irish Lack faxed to requesting provider

## 2020-11-09 ENCOUNTER — Ambulatory Visit: Payer: Self-pay | Admitting: Orthopedic Surgery

## 2020-11-09 MED ORDER — ACETAMINOPHEN 10 MG/ML IV SOLN: 1000.0000 mg | INTRAVENOUS | Status: AC

## 2020-11-18 NOTE — Progress Notes (Addendum)
COVID swab appointment: 11-28-20  COVID Vaccine Completed:  Yes x3 Date COVID Vaccine completed:  04-30-19, 05-20-19 Has received booster:  Yes x1 COVID vaccine manufacturer: [place]       Date of COVID positive in last 90 days: No  PCP - Howard Pouch, [place], [place]. Last OV 11-01-20 with clearance Electrophysiology - Thompson Grayer  Chest x-ray - 08-25-20 Epic EKG - 11-01-20 Epic Stress Test - N/A ECHO - 01-01-20 Epic Cardiac Cath - greater than 2 years Pacemaker/ICD device last checked: Spinal Cord Stimulator: Long Term Monitor - 2021 Epic  Sleep Study - N/A CPAP -   Fasting Blood Sugar - N/A Checks Blood Sugar _____ times a day  Blood Thinner Instructions: Aspirin Instructions:  ASA 81 mg.  To stop one week before per patient Last Dose:  Activity level:  Can go up a flight of stairs and perform activities of daily living without stopping and without symptoms of chest pain or shortness of breath.      Anesthesia review:  AfFlutter, CAD, Hx of MI, RBBB, Hx of CABG  Patient denies shortness of breath, fever, cough and chest pain at PAT appointment   Patient verbalized understanding of instructions that were given to them at the PAT appointment. Patient was also instructed that they will need to review over the PAT instructions again at home before surgery.

## 2020-11-18 NOTE — Patient Instructions (Addendum)
DUE TO COVID-19 ONLY ONE VISITOR IS ALLOWED TO COME WITH YOU AND STAY IN THE WAITING ROOM ONLY DURING PRE OP AND PROCEDURE.   **NO VISITORS ARE ALLOWED IN THE SHORT STAY AREA OR RECOVERY ROOM!!**  IF YOU WILL BE ADMITTED INTO THE HOSPITAL YOU ARE ALLOWED ONLY TWO SUPPORT PEOPLE DURING VISITATION HOURS ONLY (10AM -8PM)   The support person(s) may change daily. The support person(s) must pass our screening, gel in and out, and wear a mask at all times, including in the patient's room. Patients must also wear a mask when staff or their support person are in the room.  No visitors under the age of 71. Any visitor under the age of 47 must be accompanied by an adult.   COVID SWAB TESTING MUST BE COMPLETED ON:  Monday, 11-28-20 Between the hours of 8 and 3  **MUST PRESENT COMPLETED FORM AT TESTING SITE**    [address] [place] (backside of the building)  You are not required to quarantine, however you are required to wear a well-fitted mask when you are out and around people not in your household.  Hand Hygiene often Do NOT share personal items Notify your provider if you are in close contact with someone who has COVID or you develop fever 100.4 or greater, new onset of sneezing, cough, sore throat, shortness of breath or body aches.         Your procedure is scheduled on:  Wednesday, 11-30-20   Report to [facility] Main  Entrance   Report to Short Stay at 5:15 AM   [facility])     Call this number if you have problems the morning of surgery [phone]   Do not eat food :After Midnight.   May have liquids until 4:30 AM day of surgery  CLEAR LIQUID DIET  Foods Allowed                                                                     Foods Excluded  Water, Black Coffee (no milk/no creamer) and tea, regular and decaf                              liquids that you cannot  Plain Jell-O in any flavor  (No red)                         see through such  as: Fruit ices (not with fruit pulp)                                 milk, soups, orange juice  Iced Popsicles (No red)                                    All solid food                             Apple juices Sports drinks like Gatorade (No red) Lightly seasoned clear broth or consume(fat free)  Sugar    Complete one Ensure drink the morning of surgery at  4:30 AM the day of surgery.     The day of surgery:  Drink ONE (1) Pre-Surgery Clear Ensure the morning of surgery. Drink in one sitting. Do not sip.  This drink was given to you during your hospital  pre-op appointment visit. Nothing else to drink after completing the Pre-Surgery Clear Ensure.          If you have questions, please contact your surgeon's office.     Oral Hygiene is also important to reduce your risk of infection.                                    Remember - BRUSH YOUR TEETH THE MORNING OF SURGERY WITH YOUR REGULAR TOOTHPASTE   Do NOT smoke after Midnight   Take these medicines the morning of surgery with A SIP OF WATER:  None   Stop Aspirin one week prior to surgery                    Stop all vitamins and herbal supplements a week before surgery             You may not have any metal on your body including jewelry, and body piercing             Do not wear lotions, powders, cologne, or deodorant              Men may shave face and neck.  Do not bring valuables to the hospital. [place].   Contacts, dentures or bridgework may not be worn into surgery.   Bring small overnight bag day of surgery.  Please read over the following fact sheets you were given: IF YOU HAVE QUESTIONS ABOUT YOUR PRE OP INSTRUCTIONS PLEASE CALL [facility] - Preparing for Surgery Before surgery, you can play an important role.  Because skin is not sterile, your skin needs to be as free of germs as possible.  You can reduce the number of germs on your skin by washing with  CHG (chlorahexidine gluconate) soap before surgery.  CHG is an antiseptic cleaner which kills germs and bonds with the skin to continue killing germs even after washing. Please DO NOT use if you have an allergy to CHG or antibacterial soaps.  If your skin becomes reddened/irritated stop using the CHG and inform your nurse when you arrive at Short Stay. Do not shave (including legs and underarms) for at least 48 hours prior to the first CHG shower.  You may shave your face/neck.  Please follow these instructions carefully:  1.  Shower with CHG Soap the night before surgery and the  morning of surgery.  2.  If you choose to wash your hair, wash your hair first as usual with your normal  shampoo.  3.  After you shampoo, rinse your hair and body thoroughly to remove the shampoo.                             4.  Use CHG as you would any other liquid soap.  You can apply chg directly to the skin and wash.  Gently with a scrungie or clean washcloth.  5.  Apply the CHG Soap to your body ONLY  FROM THE NECK DOWN.   Do   not use on face/ open                           Wound or open sores. Avoid contact with eyes, ears mouth and   genitals (private parts).                       Wash face,  Genitals (private parts) with your normal soap.             6.  Wash thoroughly, paying special attention to the area where your    surgery  will be performed.  7.  Thoroughly rinse your body with warm water from the neck down.  8.  DO NOT shower/wash with your normal soap after using and rinsing off the CHG Soap.                9.  Pat yourself dry with a clean towel.            10.  Wear clean pajamas.            11.  Place clean sheets on your bed the night of your first shower and do not  sleep with pets. Day of Surgery : Do not apply any lotions/deodorants the morning of surgery.  Please wear clean clothes to the hospital/surgery center.  FAILURE TO FOLLOW THESE INSTRUCTIONS MAY RESULT IN THE CANCELLATION OF YOUR  SURGERY  PATIENT SIGNATURE_________________________________  NURSE SIGNATURE__________________________________  ________________________________________________________________________   Adam Phenix  An incentive spirometer is a tool that can help keep your lungs clear and active. This tool measures how well you are filling your lungs with each breath. Taking long deep breaths may help reverse or decrease the chance of developing breathing (pulmonary) problems (especially infection) following: A long period of time when you are unable to move or be active. BEFORE THE PROCEDURE  If the spirometer includes an indicator to show your best effort, your nurse or respiratory therapist will set it to a desired goal. If possible, sit up straight or lean slightly forward. Try not to slouch. Hold the incentive spirometer in an upright position. INSTRUCTIONS FOR USE  Sit on the edge of your bed if possible, or sit up as far as you can in bed or on a chair. Hold the incentive spirometer in an upright position. Breathe out normally. Place the mouthpiece in your mouth and seal your lips tightly around it. Breathe in slowly and as deeply as possible, raising the piston or the ball toward the top of the column. Hold your breath for 3-5 seconds or for as long as possible. Allow the piston or ball to fall to the bottom of the column. Remove the mouthpiece from your mouth and breathe out normally. Rest for a few seconds and repeat Steps 1 through 7 at least 10 times every 1-2 hours when you are awake. Take your time and take a few normal breaths between deep breaths. The spirometer may include an indicator to show your best effort. Use the indicator as a goal to work toward during each repetition. After each set of 10 deep breaths, practice coughing to be sure your lungs are clear. If you have an incision (the cut made at the time of surgery), support your incision when coughing by placing a pillow or  rolled up towels firmly against it. Once you are able to get out of  bed, walk around indoors and cough well. You may stop using the incentive spirometer when instructed by your caregiver.  RISKS AND COMPLICATIONS Take your time so you do not get dizzy or light-headed. If you are in pain, you may need to take or ask for pain medication before doing incentive spirometry. It is harder to take a deep breath if you are having pain. AFTER USE Rest and breathe slowly and easily. It can be helpful to keep track of a log of your progress. Your caregiver can provide you with a simple table to help with this. If you are using the spirometer at home, follow these instructions: [place] IF:  You are having difficultly using the spirometer. You have trouble using the spirometer as often as instructed. Your pain medication is not giving enough relief while using the spirometer. You develop fever of 100.5 F (38.1 C) or higher. SEEK IMMEDIATE MEDICAL CARE IF:  You cough up bloody sputum that had not been present before. You develop fever of 102 F (38.9 C) or greater. You develop worsening pain at or near the incision site. MAKE SURE YOU:  Understand these instructions. Will watch your condition. Will get help right away if you are not doing well or get worse. Document Released: 07/02/2006 Document Revised: 05/14/2011 Document Reviewed: 09/02/2006 [facility] Patient Information 2014 [place], [place].   ________________________________________________________________________

## 2020-11-21 ENCOUNTER — Other Ambulatory Visit (HOSPITAL_COMMUNITY): Payer: Self-pay

## 2020-11-21 DIAGNOSIS — M1712 Unilateral primary osteoarthritis, left knee: Secondary | ICD-10-CM | POA: Diagnosis not present

## 2020-11-21 DIAGNOSIS — M25562 Pain in left knee: Secondary | ICD-10-CM | POA: Diagnosis not present

## 2020-11-22 ENCOUNTER — Other Ambulatory Visit: Payer: Self-pay

## 2020-11-22 ENCOUNTER — Encounter (HOSPITAL_COMMUNITY)
Admission: RE | Admit: 2020-11-22 | Discharge: 2020-11-22 | Disposition: A | Discharge status: Home / Self Care | Payer: Medicare HMO | Source: Ambulatory Visit | Attending: Specialist | Admitting: Specialist

## 2020-11-22 ENCOUNTER — Encounter (HOSPITAL_COMMUNITY): Payer: Self-pay

## 2020-11-22 DIAGNOSIS — Z951 Presence of aortocoronary bypass graft: Secondary | ICD-10-CM | POA: Insufficient documentation

## 2020-11-22 DIAGNOSIS — I2510 Atherosclerotic heart disease of native coronary artery without angina pectoris: Secondary | ICD-10-CM | POA: Insufficient documentation

## 2020-11-22 DIAGNOSIS — N400 Enlarged prostate without lower urinary tract symptoms: Secondary | ICD-10-CM | POA: Insufficient documentation

## 2020-11-22 DIAGNOSIS — Z79899 Other long term (current) drug therapy: Secondary | ICD-10-CM | POA: Diagnosis not present

## 2020-11-22 DIAGNOSIS — I4892 Unspecified atrial flutter: Secondary | ICD-10-CM | POA: Insufficient documentation

## 2020-11-22 DIAGNOSIS — Z791 Long term (current) use of non-steroidal anti-inflammatories (NSAID): Secondary | ICD-10-CM | POA: Diagnosis not present

## 2020-11-22 DIAGNOSIS — Z85528 Personal history of other malignant neoplasm of kidney: Secondary | ICD-10-CM | POA: Diagnosis not present

## 2020-11-22 DIAGNOSIS — Z01812 Encounter for preprocedural laboratory examination: Secondary | ICD-10-CM | POA: Insufficient documentation

## 2020-11-22 DIAGNOSIS — Z7982 Long term (current) use of aspirin: Secondary | ICD-10-CM | POA: Insufficient documentation

## 2020-11-22 DIAGNOSIS — M1712 Unilateral primary osteoarthritis, left knee: Secondary | ICD-10-CM | POA: Diagnosis not present

## 2020-11-22 HISTORY — DX: Personal history of urinary calculi: Z87.442

## 2020-11-22 HISTORY — DX: Acute myocardial infarction, unspecified: I21.9

## 2020-11-22 LAB — BASIC METABOLIC PANEL
Anion gap: 8 (ref 5–15)
BUN: 22 mg/dL (ref 8–23)
CO2: 27 mmol/L (ref 22–32)
Calcium: 9.6 mg/dL (ref 8.9–10.3)
Chloride: 109 mmol/L (ref 98–111)
Creatinine, Ser: 0.88 mg/dL (ref 0.61–1.24)
GFR, Estimated: >60 mL/min (ref >60)
Glucose, Bld: 98 mg/dL (ref 70–99)
Potassium: 4.3 mmol/L (ref 3.5–5.1)
Sodium: 144 mmol/L (ref 135–145)

## 2020-11-22 LAB — SURGICAL PCR SCREEN
MRSA, PCR: NEGATIVE
Staphylococcus aureus: NEGATIVE

## 2020-11-22 LAB — CBC
HCT: 40.1 % (ref 39.0–52.0)
Hemoglobin: 13.2 g/dL (ref 13.0–17.0)
MCH: 30.1 pg (ref 26.0–34.0)
MCHC: 32.9 g/dL (ref 30.0–36.0)
MCV: 91.3 fL (ref 80.0–100.0)
Platelets: 218 10*3/uL (ref 150–400)
RBC: 4.39 MIL/uL (ref 4.22–5.81)
RDW: 13.1 % (ref 11.5–15.5)
WBC: 5.8 10*3/uL (ref 4.0–10.5)
nRBC: 0 % (ref 0.0–0.2)

## 2020-11-22 LAB — URINALYSIS, ROUTINE W REFLEX MICROSCOPIC
Bacteria, UA: NONE SEEN
Bilirubin Urine: NEGATIVE
Glucose, UA: NEGATIVE mg/dL
Hgb urine dipstick: NEGATIVE
Ketones, ur: NEGATIVE mg/dL
Leukocytes,Ua: NEGATIVE
Nitrite: NEGATIVE
Protein, ur: NEGATIVE mg/dL
Specific Gravity, Urine: 1.024 (ref 1.005–1.030)
pH: 6 (ref 5.0–8.0)

## 2020-11-22 LAB — PROTIME-INR
INR: 1 (ref 0.8–1.2)
Prothrombin Time: 13.1 seconds (ref 11.4–15.2)

## 2020-11-22 LAB — APTT: aPTT: 28 seconds (ref 24–36)

## 2020-11-23 DIAGNOSIS — M25562 Pain in left knee: Secondary | ICD-10-CM | POA: Diagnosis not present

## 2020-11-23 DIAGNOSIS — M1712 Unilateral primary osteoarthritis, left knee: Secondary | ICD-10-CM | POA: Diagnosis not present

## 2020-11-23 DIAGNOSIS — M25662 Stiffness of left knee, not elsewhere classified: Secondary | ICD-10-CM | POA: Diagnosis not present

## 2020-11-23 NOTE — Progress Notes (Signed)
Anesthesia Chart Review   Case: [id number] Date/Time: 11/30/20 0715   Procedure: TOTAL KNEE ARTHROPLASTY (Left: Knee)   Anesthesia type: Spinal   Pre-op diagnosis: Left knee degenerative joint disease   Location[facility] ROOM 05 / [facility]   Surgeons: Susa Day, MD       DISCUSSION:75 y.o. never smoker with h/o CAD (CABG 2003), atrial flutter s/p ablation, BPH, renal cell carcinoma, left knee djd scheduled for above procedure 11/30/2020 with Dr. Susa Day.   Pt last seen by cardiology 11/01/2020. Per OV note, "CAD/preop eval: No angina. no further cardiac tests are needed before surgery.  Continue aggressive seocondary prevention.  No bleeding issues."  Anticipate pt can proceed with planned procedure barring acute status change.   VS: BP 135/79   Pulse 60   Temp 36.6 C (Oral)   Resp 16   Ht 5\' 5"  (1.651 m)   Wt 70.9 kg   SpO2 99%   BMI 26.03 kg/m   PROVIDERS: Kuneff, Renee A, DO is PCP   Larae Grooms, MD is Cardiologist  LABS: Labs reviewed: Acceptable for surgery. (all labs ordered are listed, but only abnormal results are displayed)  Labs Reviewed  SURGICAL PCR SCREEN  CBC  BASIC METABOLIC PANEL  APTT  PROTIME-INR  URINALYSIS, ROUTINE W REFLEX MICROSCOPIC     IMAGES:   EKG: 11/01/2020 Rate 58 bpm  SR RBBB  CV: Echo 01/01/2020  1. Left ventricular ejection fraction, by estimation, is 55 to 60%. The  left ventricle has normal function. The left ventricle has no regional  wall motion abnormalities. Left ventricular diastolic parameters were  normal.   2. Right ventricular systolic function is normal. The right ventricular  size is normal.   3. The mitral valve is normal in structure. Trivial mitral valve  regurgitation. No evidence of mitral stenosis.   4. The aortic valve is tricuspid. Aortic valve regurgitation is not  visualized. No aortic stenosis is present.   5. The inferior vena cava is normal in size with greater than 50%  respiratory  variability, suggesting right atrial pressure of 3 mmHg.  Past Medical History:  Diagnosis Date   Age-related nuclear cataract, bilateral 06/29/2020   Allergic rhinitis    BPH (benign prostatic hyperplasia)    Chicken pox    Coronary artery disease    a. MI 2003 with CABG.   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    Osteoarthritis of knee 05/13/2017   Renal cell carcinoma ([place]) 02/04/2019   Partial nephrectomyKIDNEY, LEFT, PARTIAL NEPHRECTOMY:    Right ureteral stone    S/P ablation of atrial flutter    followed by dr Rayann Heman ([facility] cardiolgoist)  ablation 04-17-2004  and by dr Rayann Heman 02-10- 2011 was successful   S/P CABG x 5 08-05-2001   in [place] , [place]   LIMA to LAD,  seqVG to Diagonal,  SeqVG to OM,  seqVG to PDA and PLA   Seasonal allergies    Sinus bradycardia    Wears glasses     Past Surgical History:  Procedure Laterality Date   CARDIAC CATHETERIZATION  08-04-2001  in [place], [place]   severe 3 vessel cad   Micro  x2  04-17-2004 [place], [place]);  04-14-2009 by dr Rayann Heman   per dr allred report-- successful ablation clockwise isthmus-dependent right atrial flutter along the usual cavotricupid isthmus;  complete bidirectionl isthmus block achieved   CORONARY ARTERY BYPASS GRAFT  08-06-2003  in [place], [place]   x5-- LIMA - LAD,  seqVG to Diagonal, OM, PDA, and PLA   CYSTOSCOPY WITH RETROGRADE PYELOGRAM, URETEROSCOPY AND STENT PLACEMENT Right 08/10/2016   Procedure: CYSTOSCOPY WITH RETROGRADE PYELOGRAM, URETEROSCOPY AND STENT PLACEMENT;  Surgeon: Alexis Frock, MD;  Location: [facility];  Service: Urology;   Laterality: Right;   FIXATION RIGHT WRIST Right 2011   stability for arthritis   INGUINAL HERNIA REPAIR Right 08-08-2015   [facility]- health care at [place] Left 02/04/2019   Procedure: XI ROBOTIC ASSITED LEFT PARTIAL NEPHRECTOMY; LEFT PARTIAL ADRENALECTOMY;  Surgeon: Alexis Frock, MD;  [facility];  Service: Urology;  Laterality: Left;  3 HRS   ROTATOR CUFF REPAIR Right 2004;  2007   WISDOM TOOTH EXTRACTION      MEDICATIONS:  aspirin 81 MG chewable tablet   atorvastatin (LIPITOR) 40 MG tablet   ibuprofen (ADVIL) 800 MG tablet   midodrine (PROAMATINE) 2.5 MG tablet   Multiple Vitamin (MULTIVITAMIN ADULT PO)   Omega-3 Fatty Acids (FISH OIL) 1000 MG CAPS   Red Yeast Rice 600 MG CAPS   No current facility-administered medications for this encounter.    Konrad Felix Ward, PA-C [facility] [phone]

## 2020-11-23 NOTE — Anesthesia Preprocedure Evaluation (Addendum)
Anesthesia Evaluation  Patient identified by MRN, date of birth, ID band Patient awake    Reviewed: Allergy & Precautions, NPO status , Patient's Chart, lab work & pertinent test results  Airway Mallampati: I  TM Distance: >3 FB Neck ROM: Full    Dental  (+) Teeth Intact, Dental Advisory Given   Pulmonary neg pulmonary ROS,    breath sounds clear to auscultation       Cardiovascular + CAD, + Past MI and + CABG  + dysrhythmias  Rhythm:Regular Rate:Normal     Neuro/Psych negative neurological ROS  negative psych ROS   GI/Hepatic negative GI ROS, Neg liver ROS,   Endo/Other  negative endocrine ROS  Renal/GU Renal disease     Musculoskeletal  (+) Arthritis ,   Abdominal Normal abdominal exam  (+)   Peds  Hematology negative hematology ROS (+)   Anesthesia Other Findings   Reproductive/Obstetrics                             Anesthesia Physical Anesthesia Plan  ASA: 3  Anesthesia Plan: Spinal   Post-op Pain Management:  Regional for Post-op pain   Induction: Intravenous  PONV Risk Score and Plan: 2 and Ondansetron, Propofol infusion and Dexamethasone  Airway Management Planned: Natural Airway and Simple Face Mask  Additional Equipment: None  Intra-op Plan:   Post-operative Plan:   Informed Consent: I have reviewed the patients History and Physical, chart, labs and discussed the procedure including the risks, benefits and alternatives for the proposed anesthesia with the patient or authorized representative who has indicated his/her understanding and acceptance.     Dental advisory given  Plan Discussed with: CRNA  Anesthesia Plan Comments: (See PAT note 11/22/2020, Konrad Felix Ward, PA-C)       Anesthesia Quick Evaluation

## 2020-11-28 ENCOUNTER — Other Ambulatory Visit: Payer: Self-pay | Admitting: Specialist

## 2020-11-28 LAB — SARS CORONAVIRUS 2 (TAT 6-24 HRS): SARS Coronavirus 2: NEGATIVE

## 2020-11-29 ENCOUNTER — Other Ambulatory Visit (HOSPITAL_COMMUNITY): Payer: Self-pay

## 2020-11-30 ENCOUNTER — Other Ambulatory Visit: Payer: Self-pay

## 2020-11-30 ENCOUNTER — Ambulatory Visit (HOSPITAL_COMMUNITY): Payer: Medicare HMO | Admitting: Physician Assistant

## 2020-11-30 ENCOUNTER — Inpatient Hospital Stay (HOSPITAL_COMMUNITY)
Admission: RE | Admit: 2020-11-30 | Discharge: 2020-12-10 | DRG: 470 | Disposition: A | Discharge status: Home / Self Care | Payer: Medicare HMO | Attending: Specialist | Admitting: Specialist

## 2020-11-30 ENCOUNTER — Ambulatory Visit (HOSPITAL_COMMUNITY): Payer: Medicare HMO | Admitting: Anesthesiology

## 2020-11-30 ENCOUNTER — Ambulatory Visit (HOSPITAL_COMMUNITY): Payer: Medicare HMO

## 2020-11-30 ENCOUNTER — Encounter (HOSPITAL_COMMUNITY)
Admission: RE | Disposition: A | Discharge status: Home / Self Care | Payer: Self-pay | Source: Home / Self Care | Attending: Specialist

## 2020-11-30 ENCOUNTER — Encounter (HOSPITAL_COMMUNITY): Payer: Self-pay | Admitting: Specialist

## 2020-11-30 DIAGNOSIS — I480 Paroxysmal atrial fibrillation: Secondary | ICD-10-CM | POA: Diagnosis not present

## 2020-11-30 DIAGNOSIS — Y828 Other medical devices associated with adverse incidents: Secondary | ICD-10-CM | POA: Diagnosis not present

## 2020-11-30 DIAGNOSIS — M1712 Unilateral primary osteoarthritis, left knee: Principal | ICD-10-CM | POA: Diagnosis present

## 2020-11-30 DIAGNOSIS — I4892 Unspecified atrial flutter: Secondary | ICD-10-CM | POA: Diagnosis not present

## 2020-11-30 DIAGNOSIS — G8918 Other acute postprocedural pain: Secondary | ICD-10-CM | POA: Diagnosis not present

## 2020-11-30 DIAGNOSIS — Z951 Presence of aortocoronary bypass graft: Secondary | ICD-10-CM

## 2020-11-30 DIAGNOSIS — I9789 Other postprocedural complications and disorders of the circulatory system, not elsewhere classified: Secondary | ICD-10-CM | POA: Diagnosis not present

## 2020-11-30 DIAGNOSIS — R001 Bradycardia, unspecified: Secondary | ICD-10-CM | POA: Diagnosis present

## 2020-11-30 DIAGNOSIS — I951 Orthostatic hypotension: Secondary | ICD-10-CM | POA: Diagnosis not present

## 2020-11-30 DIAGNOSIS — E785 Hyperlipidemia, unspecified: Secondary | ICD-10-CM | POA: Diagnosis present

## 2020-11-30 DIAGNOSIS — M7989 Other specified soft tissue disorders: Secondary | ICD-10-CM | POA: Diagnosis not present

## 2020-11-30 DIAGNOSIS — L03113 Cellulitis of right upper limb: Secondary | ICD-10-CM | POA: Diagnosis not present

## 2020-11-30 DIAGNOSIS — I10 Essential (primary) hypertension: Secondary | ICD-10-CM | POA: Diagnosis not present

## 2020-11-30 DIAGNOSIS — Z96659 Presence of unspecified artificial knee joint: Secondary | ICD-10-CM

## 2020-11-30 DIAGNOSIS — I2510 Atherosclerotic heart disease of native coronary artery without angina pectoris: Secondary | ICD-10-CM | POA: Diagnosis present

## 2020-11-30 DIAGNOSIS — Z79899 Other long term (current) drug therapy: Secondary | ICD-10-CM

## 2020-11-30 DIAGNOSIS — Z85828 Personal history of other malignant neoplasm of skin: Secondary | ICD-10-CM

## 2020-11-30 DIAGNOSIS — T827XXA Infection and inflammatory reaction due to other cardiac and vascular devices, implants and grafts, initial encounter: Secondary | ICD-10-CM | POA: Diagnosis not present

## 2020-11-30 DIAGNOSIS — E860 Dehydration: Secondary | ICD-10-CM | POA: Diagnosis not present

## 2020-11-30 DIAGNOSIS — Z7982 Long term (current) use of aspirin: Secondary | ICD-10-CM

## 2020-11-30 DIAGNOSIS — Z8249 Family history of ischemic heart disease and other diseases of the circulatory system: Secondary | ICD-10-CM

## 2020-11-30 DIAGNOSIS — Z808 Family history of malignant neoplasm of other organs or systems: Secondary | ICD-10-CM

## 2020-11-30 DIAGNOSIS — Z905 Acquired absence of kidney: Secondary | ICD-10-CM

## 2020-11-30 DIAGNOSIS — N400 Enlarged prostate without lower urinary tract symptoms: Secondary | ICD-10-CM | POA: Diagnosis present

## 2020-11-30 DIAGNOSIS — I252 Old myocardial infarction: Secondary | ICD-10-CM | POA: Diagnosis not present

## 2020-11-30 DIAGNOSIS — Z820 Family history of epilepsy and other diseases of the nervous system: Secondary | ICD-10-CM

## 2020-11-30 DIAGNOSIS — Z96652 Presence of left artificial knee joint: Secondary | ICD-10-CM | POA: Diagnosis not present

## 2020-11-30 DIAGNOSIS — Z720 Tobacco use: Secondary | ICD-10-CM

## 2020-11-30 DIAGNOSIS — Z85528 Personal history of other malignant neoplasm of kidney: Secondary | ICD-10-CM

## 2020-11-30 DIAGNOSIS — Z471 Aftercare following joint replacement surgery: Secondary | ICD-10-CM | POA: Diagnosis not present

## 2020-11-30 DIAGNOSIS — M21162 Varus deformity, not elsewhere classified, left knee: Secondary | ICD-10-CM | POA: Diagnosis not present

## 2020-11-30 DIAGNOSIS — D649 Anemia, unspecified: Secondary | ICD-10-CM | POA: Diagnosis not present

## 2020-11-30 DIAGNOSIS — Z8719 Personal history of other diseases of the digestive system: Secondary | ICD-10-CM

## 2020-11-30 HISTORY — PX: TOTAL KNEE ARTHROPLASTY: SHX125

## 2020-11-30 HISTORY — DX: Presence of unspecified artificial knee joint: Z96.659

## 2020-11-30 SURGERY — ARTHROPLASTY, KNEE, TOTAL
Anesthesia: Spinal | Site: Knee | Laterality: Left

## 2020-11-30 MED ORDER — FENTANYL CITRATE (PF) 100 MCG/2ML IJ SOLN
INTRAMUSCULAR | Status: AC
  Filled 2020-11-30: qty 2

## 2020-11-30 MED ORDER — PROPOFOL 1000 MG/100ML IV EMUL
INTRAVENOUS | Status: AC
  Filled 2020-11-30: qty 100

## 2020-11-30 MED ORDER — RISAQUAD PO CAPS
1.0000 | ORAL_CAPSULE | Freq: Every day | ORAL | Status: DC
  Administered 2020-11-30 – 2020-12-10 (×11): 1 via ORAL
  Filled 2020-11-30 (×11): qty 1

## 2020-11-30 MED ORDER — ACETAMINOPHEN 325 MG PO TABS: 325.0000 mg | ORAL_TABLET | ORAL | Status: DC | PRN

## 2020-11-30 MED ORDER — MIDAZOLAM HCL 2 MG/2ML IJ SOLN
INTRAMUSCULAR | Status: AC
  Filled 2020-11-30: qty 2

## 2020-11-30 MED ORDER — LACTATED RINGERS IV SOLN: INTRAVENOUS | Status: DC

## 2020-11-30 MED ORDER — ROPIVACAINE HCL 5 MG/ML IJ SOLN
INTRAMUSCULAR | Status: DC | PRN
  Administered 2020-11-30: 30 mL via PERINEURAL

## 2020-11-30 MED ORDER — DOCUSATE SODIUM 100 MG PO CAPS: 100.0000 mg | ORAL_CAPSULE | Freq: Two times a day (BID) | ORAL | 1 refills | Status: DC | PRN

## 2020-11-30 MED ORDER — PROPOFOL 10 MG/ML IV BOLUS
INTRAVENOUS | Status: DC | PRN
  Administered 2020-11-30 (×8): 20 mg via INTRAVENOUS

## 2020-11-30 MED ORDER — ALUM & MAG HYDROXIDE-SIMETH 200-200-20 MG/5ML PO SUSP: 30.0000 mL | ORAL | Status: DC | PRN

## 2020-11-30 MED ORDER — ACETAMINOPHEN 325 MG PO TABS
325.0000 mg | ORAL_TABLET | Freq: Four times a day (QID) | ORAL | Status: DC | PRN
  Administered 2020-12-05 – 2020-12-08 (×5): 650 mg via ORAL
  Filled 2020-11-30 (×5): qty 2

## 2020-11-30 MED ORDER — PROPOFOL 500 MG/50ML IV EMUL
INTRAVENOUS | Status: DC | PRN
  Administered 2020-11-30: 50 ug/kg/min via INTRAVENOUS

## 2020-11-30 MED ORDER — OXYCODONE HCL 5 MG/5ML PO SOLN
5.0000 mg | Freq: Once | ORAL | Status: DC | PRN
Start: 2020-11-30 — End: 2020-11-30

## 2020-11-30 MED ORDER — ONDANSETRON HCL 4 MG/2ML IJ SOLN: 4.0000 mg | Freq: Four times a day (QID) | INTRAMUSCULAR | Status: DC | PRN

## 2020-11-30 MED ORDER — DEXAMETHASONE SODIUM PHOSPHATE 10 MG/ML IJ SOLN
INTRAMUSCULAR | Status: AC
  Filled 2020-11-30: qty 1

## 2020-11-30 MED ORDER — METOCLOPRAMIDE HCL 5 MG/ML IJ SOLN: 5.0000 mg | Freq: Three times a day (TID) | INTRAMUSCULAR | Status: DC | PRN

## 2020-11-30 MED ORDER — ORAL CARE MOUTH RINSE: 15.0000 mL | Freq: Once | OROMUCOSAL | Status: AC

## 2020-11-30 MED ORDER — POLYETHYLENE GLYCOL 3350 17 G PO PACK: 17.0000 g | PACK | Freq: Every day | ORAL | 0 refills | Status: DC

## 2020-11-30 MED ORDER — AMISULPRIDE (ANTIEMETIC) 5 MG/2ML IV SOLN: 10.0000 mg | Freq: Once | INTRAVENOUS | Status: DC | PRN

## 2020-11-30 MED ORDER — ADULT MULTIVITAMIN W/MINERALS CH
ORAL_TABLET | Freq: Every day | ORAL | Status: DC
  Administered 2020-12-01 – 2020-12-10 (×10): 1 via ORAL
  Filled 2020-11-30 (×10): qty 1

## 2020-11-30 MED ORDER — ONDANSETRON HCL 4 MG PO TABS: 4.0000 mg | ORAL_TABLET | Freq: Four times a day (QID) | ORAL | Status: DC | PRN

## 2020-11-30 MED ORDER — DOCUSATE SODIUM 100 MG PO CAPS
100.0000 mg | ORAL_CAPSULE | Freq: Two times a day (BID) | ORAL | Status: DC
  Administered 2020-11-30 – 2020-12-10 (×20): 100 mg via ORAL
  Filled 2020-11-30 (×20): qty 1

## 2020-11-30 MED ORDER — SODIUM CHLORIDE 0.9 % IR SOLN
Status: DC | PRN
  Administered 2020-11-30: 3000 mL

## 2020-11-30 MED ORDER — CHLORHEXIDINE GLUCONATE 0.12 % MT SOLN
15.0000 mL | Freq: Once | OROMUCOSAL | Status: AC
  Administered 2020-11-30: 15 mL via OROMUCOSAL

## 2020-11-30 MED ORDER — PHENYLEPHRINE HCL (PRESSORS) 10 MG/ML IV SOLN
INTRAVENOUS | Status: AC
  Filled 2020-11-30: qty 2

## 2020-11-30 MED ORDER — MIDAZOLAM HCL 5 MG/5ML IJ SOLN
INTRAMUSCULAR | Status: DC | PRN
Start: 2020-11-30 — End: 2020-11-30
  Administered 2020-11-30 (×2): 1 mg via INTRAVENOUS

## 2020-11-30 MED ORDER — BUPIVACAINE IN DEXTROSE 0.75-8.25 % IT SOLN
INTRATHECAL | Status: DC | PRN
  Administered 2020-11-30: 1.6 mL via INTRATHECAL

## 2020-11-30 MED ORDER — METHOCARBAMOL 500 MG IVPB - SIMPLE MED
500.0000 mg | Freq: Four times a day (QID) | INTRAVENOUS | Status: DC | PRN
  Administered 2020-11-30: 500 mg via INTRAVENOUS
  Filled 2020-11-30: qty 50

## 2020-11-30 MED ORDER — IRRISEPT - 450ML BOTTLE WITH 0.05% CHG IN STERILE WATER, USP 99.95% OPTIME
TOPICAL | Status: DC | PRN
  Administered 2020-11-30: 450 mL via TOPICAL

## 2020-11-30 MED ORDER — OXYCODONE HCL 5 MG PO TABS: 5.0000 mg | ORAL_TABLET | Freq: Once | ORAL | Status: DC | PRN

## 2020-11-30 MED ORDER — BUPIVACAINE-EPINEPHRINE 0.25% -1:200000 IJ SOLN
INTRAMUSCULAR | Status: AC
  Filled 2020-11-30: qty 1

## 2020-11-30 MED ORDER — HYDRALAZINE HCL 20 MG/ML IJ SOLN
INTRAMUSCULAR | Status: AC
  Filled 2020-11-30: qty 1

## 2020-11-30 MED ORDER — CEFAZOLIN SODIUM-DEXTROSE 2-4 GM/100ML-% IV SOLN
2.0000 g | INTRAVENOUS | Status: AC
  Administered 2020-11-30: 2 g via INTRAVENOUS
  Filled 2020-11-30: qty 100

## 2020-11-30 MED ORDER — MIDODRINE HCL 5 MG PO TABS
2.5000 mg | ORAL_TABLET | Freq: Three times a day (TID) | ORAL | Status: DC | PRN
  Administered 2020-12-02 – 2020-12-07 (×2): 2.5 mg via ORAL
  Filled 2020-11-30 (×7): qty 1

## 2020-11-30 MED ORDER — PHENOL 1.4 % MT LIQD: 1.0000 | OROMUCOSAL | Status: DC | PRN

## 2020-11-30 MED ORDER — ONDANSETRON HCL 4 MG/2ML IJ SOLN
INTRAMUSCULAR | Status: AC
  Filled 2020-11-30: qty 2

## 2020-11-30 MED ORDER — BISACODYL 5 MG PO TBEC: 5.0000 mg | DELAYED_RELEASE_TABLET | Freq: Every day | ORAL | Status: DC | PRN

## 2020-11-30 MED ORDER — METHOCARBAMOL 500 MG IVPB - SIMPLE MED
INTRAVENOUS | Status: AC
  Filled 2020-11-30: qty 50

## 2020-11-30 MED ORDER — FENTANYL CITRATE PF 50 MCG/ML IJ SOSY
PREFILLED_SYRINGE | INTRAMUSCULAR | Status: AC
  Filled 2020-11-30: qty 2

## 2020-11-30 MED ORDER — METHOCARBAMOL 500 MG PO TABS
500.0000 mg | ORAL_TABLET | Freq: Four times a day (QID) | ORAL | Status: DC | PRN
  Administered 2020-11-30 – 2020-12-10 (×19): 500 mg via ORAL
  Filled 2020-11-30 (×19): qty 1

## 2020-11-30 MED ORDER — POLYETHYLENE GLYCOL 3350 17 G PO PACK
17.0000 g | PACK | Freq: Every day | ORAL | Status: DC | PRN
  Administered 2020-12-04 – 2020-12-09 (×2): 17 g via ORAL
  Filled 2020-11-30 (×2): qty 1

## 2020-11-30 MED ORDER — 0.9 % SODIUM CHLORIDE (POUR BTL) OPTIME
TOPICAL | Status: DC | PRN
  Administered 2020-11-30: 1000 mL

## 2020-11-30 MED ORDER — SODIUM CHLORIDE 0.9 % IV SOLN
INTRAVENOUS | Status: DC | PRN
  Administered 2020-11-30: 40 mL

## 2020-11-30 MED ORDER — BUPIVACAINE LIPOSOME 1.3 % IJ SUSP
INTRAMUSCULAR | Status: AC
  Filled 2020-11-30: qty 20

## 2020-11-30 MED ORDER — OXYCODONE HCL 5 MG PO TABS
5.0000 mg | ORAL_TABLET | ORAL | Status: DC | PRN
  Administered 2020-11-30: 5 mg via ORAL
  Administered 2020-11-30 – 2020-12-01 (×2): 10 mg via ORAL
  Administered 2020-12-01: 5 mg via ORAL
  Administered 2020-12-01 – 2020-12-04 (×5): 10 mg via ORAL
  Administered 2020-12-04: 5 mg via ORAL
  Administered 2020-12-05 (×3): 10 mg via ORAL
  Filled 2020-11-30 (×4): qty 1
  Filled 2020-11-30 (×2): qty 2
  Filled 2020-11-30: qty 1
  Filled 2020-11-30 (×8): qty 2

## 2020-11-30 MED ORDER — TRANEXAMIC ACID-NACL 1000-0.7 MG/100ML-% IV SOLN
1000.0000 mg | INTRAVENOUS | Status: AC
  Administered 2020-11-30: 1000 mg via INTRAVENOUS
  Filled 2020-11-30: qty 100

## 2020-11-30 MED ORDER — DIPHENHYDRAMINE HCL 12.5 MG/5ML PO ELIX: 12.5000 mg | ORAL_SOLUTION | ORAL | Status: DC | PRN

## 2020-11-30 MED ORDER — FENTANYL CITRATE PF 50 MCG/ML IJ SOSY
25.0000 ug | PREFILLED_SYRINGE | INTRAMUSCULAR | Status: DC | PRN
  Administered 2020-11-30 (×2): 25 ug via INTRAVENOUS

## 2020-11-30 MED ORDER — CEFAZOLIN SODIUM-DEXTROSE 2-4 GM/100ML-% IV SOLN
2.0000 g | Freq: Four times a day (QID) | INTRAVENOUS | Status: AC
  Administered 2020-11-30 (×2): 2 g via INTRAVENOUS
  Filled 2020-11-30 (×2): qty 100

## 2020-11-30 MED ORDER — KCL IN DEXTROSE-NACL 20-5-0.45 MEQ/L-%-% IV SOLN
INTRAVENOUS | Status: AC
  Filled 2020-11-30 (×2): qty 1000

## 2020-11-30 MED ORDER — OXYCODONE HCL 5 MG PO TABS: 5.0000 mg | ORAL_TABLET | ORAL | 0 refills | Status: DC | PRN

## 2020-11-30 MED ORDER — STERILE WATER FOR IRRIGATION IR SOLN
Status: DC | PRN
  Administered 2020-11-30: 2000 mL

## 2020-11-30 MED ORDER — MENTHOL 3 MG MT LOZG: 1.0000 | LOZENGE | OROMUCOSAL | Status: DC | PRN

## 2020-11-30 MED ORDER — DEXAMETHASONE SODIUM PHOSPHATE 10 MG/ML IJ SOLN
INTRAMUSCULAR | Status: DC | PRN
  Administered 2020-11-30: 10 mg via INTRAVENOUS

## 2020-11-30 MED ORDER — ACETAMINOPHEN 160 MG/5ML PO SOLN: 325.0000 mg | ORAL | Status: DC | PRN

## 2020-11-30 MED ORDER — HYDROMORPHONE HCL 1 MG/ML IJ SOLN
0.5000 mg | INTRAMUSCULAR | Status: DC | PRN
  Administered 2020-12-03: 1 mg via INTRAVENOUS
  Filled 2020-11-30: qty 1

## 2020-11-30 MED ORDER — ASPIRIN EC 81 MG PO TBEC: 81.0000 mg | DELAYED_RELEASE_TABLET | Freq: Two times a day (BID) | ORAL | 1 refills | Status: DC

## 2020-11-30 MED ORDER — HYDRALAZINE HCL 20 MG/ML IJ SOLN
INTRAMUSCULAR | Status: DC | PRN
  Administered 2020-11-30 (×2): 5 mg via INTRAVENOUS

## 2020-11-30 MED ORDER — PROPOFOL 10 MG/ML IV BOLUS
INTRAVENOUS | Status: AC
  Filled 2020-11-30: qty 20

## 2020-11-30 MED ORDER — ONDANSETRON HCL 4 MG/2ML IJ SOLN
INTRAMUSCULAR | Status: DC | PRN
  Administered 2020-11-30: 4 mg via INTRAVENOUS

## 2020-11-30 MED ORDER — METOCLOPRAMIDE HCL 5 MG PO TABS: 5.0000 mg | ORAL_TABLET | Freq: Three times a day (TID) | ORAL | Status: DC | PRN

## 2020-11-30 MED ORDER — OXYCODONE HCL 5 MG PO TABS
10.0000 mg | ORAL_TABLET | ORAL | Status: DC | PRN
  Administered 2020-12-02: 15 mg via ORAL
  Administered 2020-12-03 – 2020-12-04 (×2): 10 mg via ORAL
  Filled 2020-11-30: qty 2
  Filled 2020-11-30: qty 3

## 2020-11-30 MED ORDER — FENTANYL CITRATE (PF) 100 MCG/2ML IJ SOLN
INTRAMUSCULAR | Status: DC | PRN
  Administered 2020-11-30 (×2): 50 ug via INTRAVENOUS

## 2020-11-30 MED ORDER — BUPIVACAINE-EPINEPHRINE 0.25% -1:200000 IJ SOLN
INTRAMUSCULAR | Status: DC | PRN
  Administered 2020-11-30: 30 mL

## 2020-11-30 MED ORDER — ASPIRIN 81 MG PO CHEW
81.0000 mg | CHEWABLE_TABLET | Freq: Two times a day (BID) | ORAL | Status: DC
  Administered 2020-12-01 – 2020-12-02 (×3): 81 mg via ORAL
  Filled 2020-11-30 (×3): qty 1

## 2020-11-30 MED ORDER — ACETAMINOPHEN 10 MG/ML IV SOLN: 1000.0000 mg | Freq: Once | INTRAVENOUS | Status: DC | PRN

## 2020-11-30 MED ORDER — ACETAMINOPHEN 500 MG PO TABS
1000.0000 mg | ORAL_TABLET | Freq: Four times a day (QID) | ORAL | Status: AC
  Administered 2020-11-30 – 2020-12-01 (×4): 1000 mg via ORAL
  Filled 2020-11-30 (×4): qty 2

## 2020-11-30 SURGICAL SUPPLY — 81 items
ATTUNE MED DOME PAT 38 KNEE (Knees) ×2 IMPLANT
ATTUNE PS FEM LT SZ 6 CEM KNEE (Femur) ×2 IMPLANT
ATTUNE PSRP INSR SZ6 5 KNEE (Insert) ×2 IMPLANT
BAG COUNTER SPONGE SURGICOUNT (BAG) IMPLANT
BAG DECANTER FOR FLEXI CONT (MISCELLANEOUS) ×2 IMPLANT
BAG ZIPLOCK 12X15 (MISCELLANEOUS) IMPLANT
BASE TIBIAL ROT PLAT SZ 7 KNEE (Knees) ×1 IMPLANT
BLADE SAW SGTL 11.0X1.19X90.0M (BLADE) ×2 IMPLANT
BLADE SAW SGTL 13.0X1.19X90.0M (BLADE) ×2 IMPLANT
BLADE SURG SZ10 CARB STEEL (BLADE) ×4 IMPLANT
BNDG COHESIVE 4X5 TAN ST LF (GAUZE/BANDAGES/DRESSINGS) ×2 IMPLANT
BNDG ELASTIC 4X5.8 VLCR STR LF (GAUZE/BANDAGES/DRESSINGS) ×2 IMPLANT
BNDG ELASTIC 6X10 VLCR STRL LF (GAUZE/BANDAGES/DRESSINGS) ×2 IMPLANT
BNDG ELASTIC 6X5.8 VLCR STR LF (GAUZE/BANDAGES/DRESSINGS) ×2 IMPLANT
CEMENT HV SMART SET (Cement) ×4 IMPLANT
COVER SURGICAL LIGHT HANDLE (MISCELLANEOUS) ×2 IMPLANT
CUFF TOURN SGL QUICK 34 (TOURNIQUET CUFF) ×1
CUFF TRNQT CYL 34X4.125X (TOURNIQUET CUFF) ×1 IMPLANT
DECANTER SPIKE VIAL GLASS SM (MISCELLANEOUS) ×2 IMPLANT
DRAPE INCISE IOBAN 66X45 STRL (DRAPES) ×2 IMPLANT
DRAPE ORTHO SPLIT 77X108 STRL (DRAPES) ×2
DRAPE SHEET LG 3/4 BI-LAMINATE (DRAPES) ×4 IMPLANT
DRAPE SURG ORHT 6 SPLT 77X108 (DRAPES) ×2 IMPLANT
DRAPE U-SHAPE 47X51 STRL (DRAPES) ×2 IMPLANT
DRSG AQUACEL AG ADV 3.5X10 (GAUZE/BANDAGES/DRESSINGS) ×2 IMPLANT
DRSG TEGADERM 4X4.75 (GAUZE/BANDAGES/DRESSINGS) IMPLANT
DURAPREP 26ML APPLICATOR (WOUND CARE) ×2 IMPLANT
ELECT BLADE TIP CTD 4 INCH (ELECTRODE) ×2 IMPLANT
ELECT REM PT RETURN 15FT ADLT (MISCELLANEOUS) ×2 IMPLANT
EVACUATOR 1/8 PVC DRAIN (DRAIN) IMPLANT
GAUZE SPONGE 2X2 8PLY STRL LF (GAUZE/BANDAGES/DRESSINGS) IMPLANT
GLOVE SRG 8 PF TXTR STRL LF DI (GLOVE) ×1 IMPLANT
GLOVE SURG ENC MOIS LTX SZ7.5 (GLOVE) ×2 IMPLANT
GLOVE SURG NEOP MICRO LF SZ7.5 (GLOVE) ×8 IMPLANT
GLOVE SURG POLY ORTHO LF SZ7.5 (GLOVE) ×8 IMPLANT
GLOVE SURG POLYISO LF SZ7.5 (GLOVE) ×4 IMPLANT
GLOVE SURG POLYISO LF SZ8 (GLOVE) ×4 IMPLANT
GLOVE SURG UNDER POLY LF SZ7.5 (GLOVE) ×2 IMPLANT
GLOVE SURG UNDER POLY LF SZ8 (GLOVE) ×1
GOWN STRL REUS W/TWL XL LVL3 (GOWN DISPOSABLE) ×8 IMPLANT
HANDPIECE INTERPULSE COAX TIP (DISPOSABLE) ×1
HEMOSTAT SPONGE AVITENE ULTRA (HEMOSTASIS) IMPLANT
HOLDER FOLEY CATH W/STRAP (MISCELLANEOUS) ×2 IMPLANT
IMMOBILIZER KNEE 20 (SOFTGOODS) ×2
IMMOBILIZER KNEE 20 THIGH 36 (SOFTGOODS) ×1 IMPLANT
IV NS 1000ML (IV SOLUTION) ×3
IV NS 1000ML BAXH (IV SOLUTION) ×3 IMPLANT
JET LAVAGE IRRISEPT WOUND (IRRIGATION / IRRIGATOR) ×2
KIT TURNOVER KIT A (KITS) ×2 IMPLANT
LAVAGE JET IRRISEPT WOUND (IRRIGATION / IRRIGATOR) ×1 IMPLANT
MANIFOLD NEPTUNE II (INSTRUMENTS) ×2 IMPLANT
NDL SAFETY ECLIPSE 18X1.5 (NEEDLE) ×1 IMPLANT
NEEDLE HYPO 18GX1.5 SHARP (NEEDLE) ×1
NS IRRIG 1000ML POUR BTL (IV SOLUTION) ×2 IMPLANT
PACK TOTAL KNEE CUSTOM (KITS) ×2 IMPLANT
PROTECTOR NERVE ULNAR (MISCELLANEOUS) ×2 IMPLANT
SAW OSC TIP CART 19.5X105X1.3 (SAW) ×2 IMPLANT
SEALER BIPOLAR AQUA 6.0 (INSTRUMENTS) ×2 IMPLANT
SET HNDPC FAN SPRY TIP SCT (DISPOSABLE) ×1 IMPLANT
SPONGE GAUZE 2X2 STER 10/PKG (GAUZE/BANDAGES/DRESSINGS)
SPONGE SURGIFOAM ABS GEL 100 (HEMOSTASIS) IMPLANT
STAPLER VISISTAT (STAPLE) IMPLANT
STRIP CLOSURE SKIN 1/2X4 (GAUZE/BANDAGES/DRESSINGS) ×4 IMPLANT
SUT BONE WAX W31G (SUTURE) ×2 IMPLANT
SUT MNCRL AB 4-0 PS2 18 (SUTURE) ×2 IMPLANT
SUT STRATAFIX 0 PDS 27 VIOLET (SUTURE) ×2
SUT VIC AB 1 CT1 27 (SUTURE) ×3
SUT VIC AB 1 CT1 27XBRD ANTBC (SUTURE) ×3 IMPLANT
SUT VIC AB 1 CTX 36 (SUTURE)
SUT VIC AB 1 CTX36XBRD ANBCTR (SUTURE) IMPLANT
SUT VIC AB 2-0 CT1 27 (SUTURE) ×3
SUT VIC AB 2-0 CT1 TAPERPNT 27 (SUTURE) ×3 IMPLANT
SUTURE STRATFX 0 PDS 27 VIOLET (SUTURE) ×1 IMPLANT
SYR 3ML LL SCALE MARK (SYRINGE) IMPLANT
SYR 50ML LL SCALE MARK (SYRINGE) ×2 IMPLANT
TIBIAL BASE ROT PLAT SZ 7 KNEE (Knees) ×2 IMPLANT
TOWER CARTRIDGE SMART MIX (DISPOSABLE) ×2 IMPLANT
TRAY FOLEY MTR SLVR 16FR STAT (SET/KITS/TRAYS/PACK) ×2 IMPLANT
WATER STERILE IRR 1000ML POUR (IV SOLUTION) ×4 IMPLANT
WIPE CHG CHLORHEXIDINE 2% (PERSONAL CARE ITEMS) ×2 IMPLANT
WRAP KNEE MAXI GEL POST OP (GAUZE/BANDAGES/DRESSINGS) ×2 IMPLANT

## 2020-11-30 NOTE — H&P (Signed)
Donald [name] is an 75 y.o. male.   Chief Complaint: left knee pain  HPI: 75 year old male with persistent disabling pain of the left knee refractory to rest activity modification corticosteroid injections and exercise program.  Presents for total knee replacement.  Past Medical History:  Diagnosis Date   Age-related nuclear cataract, bilateral 06/29/2020   Allergic rhinitis    BPH (benign prostatic hyperplasia)    Chicken pox    Coronary artery disease    a. MI 2003 with CABG.   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    Osteoarthritis of knee 05/13/2017   Renal cell carcinoma ([place]) 02/04/2019   Partial nephrectomyKIDNEY, LEFT, PARTIAL NEPHRECTOMY:    Right ureteral stone    S/P ablation of atrial flutter    followed by dr Rayann Heman ([facility] cardiolgoist)  ablation 04-17-2004  and by dr allred 02-10- 2011 was successful   S/P CABG x 5 08-05-2001   in [place] , [place]   LIMA to LAD,  seqVG to Diagonal,  SeqVG to OM,  seqVG to PDA and PLA   Seasonal allergies    Sinus bradycardia    Wears glasses     Past Surgical History:  Procedure Laterality Date   CARDIAC CATHETERIZATION  08-04-2001  in [place], [place]   severe 3 vessel cad   CARDIAC ELECTROPHYSIOLOGY [place]  x2  04-17-2004 [place], [place]);  04-14-2009 by dr Rayann Heman   per dr allred report-- successful ablation clockwise isthmus-dependent right atrial flutter along the usual cavotricupid isthmus;  complete bidirectionl isthmus block achieved   CORONARY ARTERY BYPASS GRAFT  08-06-2003  in [place], [place]   x5-- LIMA - LAD,  seqVG to Diagonal, OM, PDA, and PLA   CYSTOSCOPY WITH RETROGRADE PYELOGRAM, URETEROSCOPY AND  STENT PLACEMENT Right 08/10/2016   Procedure: CYSTOSCOPY WITH RETROGRADE PYELOGRAM, URETEROSCOPY AND STENT PLACEMENT;  Surgeon: Alexis Frock, MD;  Location: [facility];  Service: Urology;  Laterality: Right;   FIXATION RIGHT WRIST Right 2011   stability for arthritis   INGUINAL HERNIA REPAIR Right 08-08-2015   [facility]- health care at [place] Left 02/04/2019   Procedure: XI ROBOTIC ASSITED LEFT PARTIAL NEPHRECTOMY; LEFT PARTIAL ADRENALECTOMY;  Surgeon: Alexis Frock, MD;  [facility];  Service: Urology;  Laterality: Left;  3 HRS   ROTATOR CUFF REPAIR Right 2004;  2007   WISDOM TOOTH EXTRACTION      Family History  Problem Relation Age of Onset   Heart attack Mother    Alzheimer's disease Mother    Cancer Father        throat cancer, chewing tobacco   Hypertension Neg Hx    Social History:  reports that he has never smoked. His smokeless tobacco use includes snuff. He reports current alcohol use. He reports that he does not use drugs.  Allergies: No Known Allergies  Medications Prior to Admission  Medication Sig Dispense Refill   aspirin 81 MG chewable tablet Chew 81 mg by mouth at bedtime.     atorvastatin (LIPITOR) 40 MG tablet Take  40 mg by mouth at bedtime.      ibuprofen (ADVIL) 800 MG tablet Take 800 mg by mouth every 8 (eight) hours as needed for mild pain or moderate pain.     Multiple Vitamin (MULTIVITAMIN ADULT PO) Take 1 tablet by mouth daily.     Omega-3 Fatty Acids (FISH OIL) 1000 MG CAPS Take 1,000 mg by mouth daily.     Red Yeast Rice 600 MG CAPS Take 600 mg by mouth daily.     midodrine (PROAMATINE) 2.5 MG tablet Take 1 tablet (2.5 mg total) by mouth 3 (three) times daily with meals as needed. (Patient not taking: No sig reported) 90 tablet 1    No results found for this or any previous visit (from the past 48 hour(s)). No results found.  Review of Systems  Musculoskeletal:  Positive for  joint swelling and myalgias.  All other systems reviewed and are negative.  Blood pressure (!) 143/76, pulse 61, temperature 98 F (36.7 C), temperature source Oral, resp. rate 16, SpO2 99 %. Physical Exam HENT:     Head: Normocephalic.  Eyes:     Pupils: Pupils are equal, round, and reactive to light.  Cardiovascular:     Rate and Rhythm: Normal rate.  Pulmonary:     Effort: Pulmonary effort is normal.  Abdominal:     General: Abdomen is flat.     Palpations: Abdomen is soft.  Musculoskeletal:     Cervical back: Normal range of motion.  Skin:    General: Skin is warm.  Neurological:     General: No focal deficit present.     Mental Status: He is alert.  Psychiatric:        Mood and Affect: Mood normal.  Left knee varus deformity.  He is tender medial joint line.  Range of motion is -5-100.  Minor varus instability with valgus stress.  Ipsilateral hip and ankle exam is unremarkable.  2+ dorsalis pedis pulse.  Good dorsiflexion plantarflexion no DVT.  X-rays of his left knee demonstrate end-stage osteoarthrosis medial compartment with varus deformity patella patellofemoral arthrosis.  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  Risk and benefits discussed including bleeding, infection, damage to neurovascular ruptures, DVT, PE, anesthetic complications arthrofibrosis need for revision etc.      Johnn Hai, MD 11/30/2020, 7:13 AM

## 2020-11-30 NOTE — Brief Op Note (Signed)
11/30/2020  9:58 AM  PATIENT:  Donald [name]  75 y.o. male  PRE-OPERATIVE DIAGNOSIS:  Left knee degenerative joint disease  POST-OPERATIVE DIAGNOSIS:  Left knee degenerative joint disease  PROCEDURE:  Procedure(s): TOTAL KNEE ARTHROPLASTY (Left)  SURGEON:  Surgeon(s) and Role:    Susa Day, MD - Primary  PHYSICIAN ASSISTANT:   ASSISTANTS: [place]   ANESTHESIA:   spinal  EBL:  125 mL   BLOOD ADMINISTERED:none  DRAINS: none   LOCAL MEDICATIONS USED:  MARCAINE     SPECIMEN:  No Specimen  DISPOSITION OF SPECIMEN:  N/A  COUNTS:  YES  TOURNIQUET:   Total Tourniquet Time Documented: Thigh (Left) - 71 minutes Total: Thigh (Left) - 71 minutes   DICTATION: .Other Dictation: Dictation Number    [id number]  PLAN OF CARE: Admit for overnight observation  PATIENT DISPOSITION:  PACU - hemodynamically stable.   Delay start of Pharmacological VTE agent (>24hrs) due to surgical blood loss or risk of bleeding:no

## 2020-11-30 NOTE — Transfer of Care (Signed)
Immediate Anesthesia Transfer of Care Note  Patient: Donald [name]  Procedure(s) Performed: TOTAL KNEE ARTHROPLASTY (Left: Knee)  Patient Location: PACU  Anesthesia Type:Spinal  Level of Consciousness: awake  Airway & Oxygen Therapy: Patient Spontanous Breathing and Patient connected to face mask oxygen  Post-op Assessment: Report given to RN and Post -op Vital signs reviewed and stable  Post vital signs: Reviewed and stable  Last Vitals:  Vitals Value Taken Time  BP 115/62 11/30/20 1015  Temp    Pulse 71 11/30/20 1016  Resp 10 11/30/20 1016  SpO2 100 % 11/30/20 1016  Vitals shown include unvalidated device data.  Last Pain:  Vitals:   11/30/20 0602  TempSrc: Oral         Complications: No notable events documented.

## 2020-11-30 NOTE — Discharge Instructions (Addendum)
Elevate leg above heart 6x a day for 8minutes each Use knee immobilizer while walking until can SLR x 10 Use knee immobilizer in bed to keep knee in extension Aquacel dressing may remain in place until follow up. May shower with aquacel dressing in place. If the dressing becomes saturated or peels off, you may remove aquacel dressing. Do not remove steri-strips if they are present. Place new dressing with gauze and tape or ACE bandage which should be kept clean and dry and changed daily.   INSTRUCTIONS AFTER JOINT REPLACEMENT   Remove items at home which could result in a fall. This includes throw rugs or furniture in walking pathways ICE to the affected joint every three hours while awake for 30 minutes at a time, for at least the first 3-5 days, and then as needed for pain and swelling.  Continue to use ice for pain and swelling. You may notice swelling that will progress down to the foot and ankle.  This is normal after surgery.  Elevate your leg when you are not up walking on it.   Continue to use the breathing machine you got in the hospital (incentive spirometer) which will help keep your temperature down.  It is common for your temperature to cycle up and down following surgery, especially at night when you are not up moving around and exerting yourself.  The breathing machine keeps your lungs expanded and your temperature down.   DIET:  As you were doing prior to hospitalization, we recommend a well-balanced diet.  DRESSING / WOUND CARE / SHOWERING  Keep the surgical dressing until follow up.  The dressing is water proof, so you can shower without any extra covering.  IF THE DRESSING FALLS OFF or the wound gets wet inside, change the dressing with sterile gauze.  Please use good hand washing techniques before changing the dressing.  Do not use any lotions or creams on the incision until instructed by your surgeon.    ACTIVITY  Increase activity slowly as tolerated, but follow the weight  bearing instructions below.   No driving for 6 weeks or until further direction given by your physician.  You cannot drive while taking narcotics.  No lifting or carrying greater than 10 lbs. until further directed by your surgeon. Avoid periods of inactivity such as sitting longer than an hour when not asleep. This helps prevent blood clots.  You may return to work once you are authorized by your doctor.     WEIGHT BEARING   Weight bearing as tolerated with assist device (walker, cane, etc) as directed, use it as long as suggested by your surgeon or therapist, typically at least 4-6 weeks.   EXERCISES  Results after joint replacement surgery are often greatly improved when you follow the exercise, range of motion and muscle strengthening exercises prescribed by your doctor. Safety measures are also important to protect the joint from further injury. Any time any of these exercises cause you to have increased pain or swelling, decrease what you are doing until you are comfortable again and then slowly increase them. If you have problems or questions, call your caregiver or physical therapist for advice.   Rehabilitation is important following a joint replacement. After just a few days of immobilization, the muscles of the leg can become weakened and shrink (atrophy).  These exercises are designed to build up the tone and strength of the thigh and leg muscles and to improve motion. Often times heat used for twenty to thirty  minutes before working out will loosen up your tissues and help with improving the range of motion but do not use heat for the first two weeks following surgery (sometimes heat can increase post-operative swelling).   These exercises can be done on a training (exercise) mat, on the floor, on a table or on a bed. Use whatever works the best and is most comfortable for you.    Use music or television while you are exercising so that the exercises are a pleasant break in your day.  This will make your life better with the exercises acting as a break in your routine that you can look forward to.   Perform all exercises about fifteen times, three times per day or as directed.  You should exercise both the operative leg and the other leg as well.  Exercises include:   Quad Sets - Tighten up the muscle on the front of the thigh (Quad) and hold for 5-10 seconds.   Straight Leg Raises - With your knee straight (if you were given a brace, keep it on), lift the leg to 60 degrees, hold for 3 seconds, and slowly lower the leg.  Perform this exercise against resistance later as your leg gets stronger.  Leg Slides: Lying on your back, slowly slide your foot toward your buttocks, bending your knee up off the floor (only go as far as is comfortable). Then slowly slide your foot back down until your leg is flat on the floor again.  Angel Wings: Lying on your back spread your legs to the side as far apart as you can without causing discomfort.  Hamstring Strength:  Lying on your back, push your heel against the floor with your leg straight by tightening up the muscles of your buttocks.  Repeat, but this time bend your knee to a comfortable angle, and push your heel against the floor.  You may put a pillow under the heel to make it more comfortable if necessary.   A rehabilitation program following joint replacement surgery can speed recovery and prevent re-injury in the future due to weakened muscles. Contact your doctor or a physical therapist for more information on knee rehabilitation.    CONSTIPATION  Constipation is defined medically as fewer than three stools per week and severe constipation as less than one stool per week.  Even if you have a regular bowel pattern at home, your normal regimen is likely to be disrupted due to multiple reasons following surgery.  Combination of anesthesia, postoperative narcotics, change in appetite and fluid intake all can affect your bowels.   YOU MUST  use at least one of the following options; they are listed in order of increasing strength to get the job done.  They are all available over the counter, and you may need to use some, POSSIBLY even all of these options:    Drink plenty of fluids (prune juice may be helpful) and high fiber foods Colace 100 mg by mouth twice a day  Senokot for constipation as directed and as needed Dulcolax (bisacodyl), take with full glass of water  Miralax (polyethylene glycol) once or twice a day as needed.  If you have tried all these things and are unable to have a bowel movement in the first 3-4 days after surgery call either your surgeon or your primary doctor.    If you experience loose stools or diarrhea, hold the medications until you stool forms back up.  If your symptoms do not get better within  1 week or if they get worse, check with your doctor.  If you experience "the worst abdominal pain ever" or develop nausea or vomiting, please contact the office immediately for further recommendations for treatment.   ITCHING:  If you experience itching with your medications, try taking only a single pain pill, or even half a pain pill at a time.  You can also use Benadryl over the counter for itching or also to help with sleep.   TED HOSE STOCKINGS:  Use stockings on both legs until for at least 2 weeks or as directed by physician office. They may be removed at night for sleeping.  MEDICATIONS:  See your medication summary on the "After Visit Summary" that nursing will review with you.  You may have some home medications which will be placed on hold until you complete the course of blood thinner medication.  It is important for you to complete the blood thinner medication as prescribed.  PRECAUTIONS:  If you experience chest pain or shortness of breath - call 911 immediately for transfer to the hospital emergency department.   If you develop a fever greater that 101 F, purulent drainage from wound, increased  redness or drainage from wound, foul odor from the wound/dressing, or calf pain - CONTACT YOUR SURGEON.                                                   FOLLOW-UP APPOINTMENTS:  If you do not already have a post-op appointment, please call the office for an appointment to be seen by your surgeon.  Guidelines for how soon to be seen are listed in your "After Visit Summary", but are typically between 1-4 weeks after surgery.  OTHER INSTRUCTIONS:   Knee Replacement:  Do not place pillow under knee, focus on keeping the knee straight while resting. CPM instructions: 0-90 degrees, 2 hours in the morning, 2 hours in the afternoon, and 2 hours in the evening. Place foam block, curve side up under heel at all times except when in CPM or when walking.  DO NOT modify, tear, cut, or change the foam block in any way.  POST-OPERATIVE OPIOID TAPER INSTRUCTIONS: It is important to wean off of your opioid medication as soon as possible. If you do not need pain medication after your surgery it is ok to stop day one. Opioids include: Codeine, Hydrocodone(Norco, Vicodin), Oxycodone(Percocet, oxycontin) and hydromorphone amongst others.  Long term and even short term use of opiods can cause: Increased pain response Dependence Constipation Depression Respiratory depression And more.  Withdrawal symptoms can include Flu like symptoms Nausea, vomiting And more Techniques to manage these symptoms Hydrate well Eat regular healthy meals Stay active Use relaxation techniques(deep breathing, meditating, yoga) Do Not substitute Alcohol to help with tapering If you have been on opioids for less than two weeks and do not have pain than it is ok to stop all together.  Plan to wean off of opioids This plan should start within one week post op of your joint replacement. Maintain the same interval or time between taking each dose and first decrease the dose.  Cut the total daily intake of opioids by one tablet each  day Next start to increase the time between doses. The last dose that should be eliminated is the evening dose.   MAKE SURE YOU:  Understand  these instructions.  Get help right away if you are not doing well or get worse.    Thank you for letting us be a part of your medical care team.  It is a privilege we respect greatly.  We hope these instructions will help you stay on track for a fast and full recovery!     ___________________________________________  Information on my medicine - ELIQUIS (apixaban)  This medication education was reviewed with me or my healthcare representative as part of my discharge preparation.    Why was Eliquis prescribed for you? Eliquis was prescribed for you to reduce the risk of a blood clot forming that can cause a stroke if you have a medical condition called atrial fibrillation (a type of irregular heartbeat).  What do You need to know about Eliquis ? Take your Eliquis TWICE DAILY - one tablet in the morning and one tablet in the evening with or without food. If you have difficulty swallowing the tablet whole please discuss with your pharmacist how to take the medication safely.  Take Eliquis exactly as prescribed by your doctor and DO NOT stop taking Eliquis without talking to the doctor who prescribed the medication.  Stopping may increase your risk of developing a stroke.  Refill your prescription before you run out.  After discharge, you should have regular check-up appointments with your healthcare provider that is prescribing your Eliquis.  In the future your dose may need to be changed if your kidney function or weight changes by a significant amount or as you get older.  What do you do if you miss a dose? If you miss a dose, take it as soon as you remember on the same day and resume taking twice daily.  Do not take more than one dose of ELIQUIS at the same time to make up a missed dose.  Important Safety Information A possible side  effect of Eliquis is bleeding. You should call your healthcare provider right away if you experience any of the following: Bleeding from an injury or your nose that does not stop. Unusual colored urine (red or dark brown) or unusual colored stools (red or black). Unusual bruising for unknown reasons. A serious fall or if you hit your head (even if there is no bleeding).  Some medicines may interact with Eliquis and might increase your risk of bleeding or clotting while on Eliquis. To help avoid this, consult your healthcare provider or pharmacist prior to using any new prescription or non-prescription medications, including herbals, vitamins, non-steroidal anti-inflammatory drugs (NSAIDs) and supplements.  This website has more information on Eliquis (apixaban): http://www.eliquis.com/eliquis/home

## 2020-11-30 NOTE — Progress Notes (Signed)
Called [facility] (On call) to inform that patient got dizzy when walking with PT in hall. BP was soft (Systolic number: 83). BP now WNL.  Orders from Cherlynn June, [place]: Increase maintenance fluid rate to 75/ml per hr.  Will continue to monitor patient.

## 2020-11-30 NOTE — Anesthesia Procedure Notes (Signed)
Anesthesia Regional Block: Adductor canal block   Pre-Anesthetic Checklist: , timeout performed,  Correct Patient, Correct Site, Correct Laterality,  Correct Procedure, Correct Position, site marked,  Risks and benefits discussed,  Surgical consent,  Pre-op evaluation,  At surgeon's request and post-op pain management  Laterality: Left  Prep: chloraprep       Needles:  Injection technique: Single-shot  Needle Type: Echogenic Stimulator Needle     Needle Length: 9cm  Needle Gauge: 21     Additional Needles:   Procedures:,,,, ultrasound used (permanent image in chart),,    Narrative:  Start time: 11/30/2020 7:00 AM End time: 11/30/2020 7:05 AM Injection made incrementally with aspirations every 5 mL.  Performed by: Personally  Anesthesiologist: Effie Berkshire, MD  Additional Notes: Patient tolerated the procedure well. Local anesthetic introduced in an incremental fashion under minimal resistance after negative aspirations. No paresthesias were elicited. After completion of the procedure, no acute issues were identified and patient continued to be monitored by RN.

## 2020-11-30 NOTE — Interval H&P Note (Signed)
History and Physical Interval Note:  11/30/2020 7:17 AM  Donald [name]  has presented today for surgery, with the diagnosis of Left knee degenerative joint disease.  The various methods of treatment have been discussed with the patient and family. After consideration of risks, benefits and other options for treatment, the patient has consented to  Procedure(s): TOTAL KNEE ARTHROPLASTY (Left) as a surgical intervention.  The patient's history has been reviewed, patient examined, no change in status, stable for surgery.  I have reviewed the patient's chart and labs.  Questions were answered to the patient's satisfaction.     Johnn Hai

## 2020-11-30 NOTE — Plan of Care (Signed)
  Problem: Education: Goal: Knowledge of General Education information will improve Description Including pain rating scale, medication(s)/side effects and non-pharmacologic comfort measures Outcome: Progressing   

## 2020-11-30 NOTE — Evaluation (Signed)
Physical Therapy Evaluation Patient Details Name: Donald [name] MRN: [id number] DOB: [birth year] Today's Date: 11/30/2020  History of Present Illness  Pt is a 75 y.o. male s/p L TKA on 11/30/20. PMH significant for CAD s/p CABG x5 , MI, OA,  RTCR (2004; 2007).  Clinical Impression  Pt is POD 0 s/p Lt TKA resulting in the deficits listed below (see PT Problem List). Pt performed sit to stand transfers with MIN assist for power up and safety with cues for safe hand placement. Pt ambulated total of ~58ft with MIN A for stability and cues for step to gait pattern and RW management, no overt LOB observed. Pt reported feeling lightheaded following ambulation requesting to return to EOB. Reported some improvement in symptoms with seated rest. BP reading after ~4-68min sitting was 83/68mmHg, 78bpm. Pt assisted back to supine with BP improved to 109/98mmHg, 59bpm. PT reviewed LE HEP for promotion of DVT prevention and improved strength/ROM, pt demonstrated understanding. Pt will have assist from his wife upon d/c. Pt will benefit from continued skilled PT to maximize safety with functional mobility and to increase independence.         Recommendations for follow up therapy are one component of a multi-disciplinary discharge planning process, led by the attending physician.  Recommendations may be updated based on patient status, additional functional criteria and insurance authorization.  Follow Up Recommendations Follow surgeon's recommendation for DC plan and follow-up therapies;Home health PT    Equipment Recommendations  None recommended by PT (pt owns RW)    Recommendations for Other Services       Precautions / Restrictions Precautions Precautions: None Restrictions Weight Bearing Restrictions: No Other Position/Activity Restrictions: WBAT      Mobility  Bed Mobility Overal bed mobility: Needs Assistance Bed Mobility: Supine to Sit;Sit to Supine     Supine to sit: Supervision;HOB  elevated Sit to supine: Min guard   General bed mobility comments: supervision with HOB elevated; MIN guard for sit to supine with education on use of gait belt to assist with progression of L LE onto bed to promote independence.    Transfers Overall transfer level: Needs assistance Equipment used: Rolling walker (2 wheeled) Transfers: Sit to/from Stand Sit to Stand: Min assist         General transfer comment: MIN A for stability and power up to rise with sit to stand from EOB. Cues provided for safe hand placement  Ambulation/Gait Ambulation/Gait assistance: Min assist Gait Distance (Feet): 60 Feet Assistive device: Rolling walker (2 wheeled) Gait Pattern/deviations: Step-to pattern;Decreased stride length;Decreased weight shift to left;Decreased stance time - left Gait velocity: decr   General Gait Details: MIN A provided for stability with cues for step to gait pattern and RW management, specifically sequencing with turns. No overt LOB observed during ambulation. Pt reported feeling lightheaded upon entry into room following ambulation requesting to return to EOB. Reported some improvement in symptoms with seated rest, denied any additional symptoms. Inital BP reading "failed", second BP reading after ~39min sitting 83/35mmHg, 78bpm. Pt assisted back to supine with BP improved to 109/67mmHg, 59bpm. RN aware and pt reporting resolution of symptoms. Pt reports he has experienced similar episodes at home, but is not sure if it has been related to his BP or not.  Stairs            Wheelchair Mobility    Modified Rankin (Stroke Patients Only)       Balance Overall balance assessment: Needs assistance Sitting-balance support: Feet supported  Sitting balance-Leahy Scale: Good     Standing balance support: Bilateral upper extremity supported;During functional activity Standing balance-Leahy Scale: Poor Standing balance comment: reliant on external support                              Pertinent Vitals/Pain Pain Assessment: 0-10 Pain Score: 5  Pain Location: Rt knee Pain Descriptors / Indicators: Constant;Sore Pain Intervention(s): Limited activity within patient's tolerance;Monitored during session;Repositioned;Premedicated before session    [place] expects to be discharged to:: Private residence Living Arrangements: Spouse/significant other Available Help at Discharge: Family Type of Home: House Home Access: Stairs to enter Entrance Stairs-Rails: Left Entrance Stairs-Number of Steps: 3 Home Layout: One level Home Equipment: [place] - 2 wheels;Cane - single point;Bedside commode;Grab bars - toilet;Grab bars - tub/shower;Hand held shower head Additional Comments: Pt reports wife is able to provide assist at home    Prior Function Level of Independence: Independent               Hand Dominance   Dominant Hand: Right    Extremity/Trunk Assessment   Upper Extremity Assessment Upper Extremity Assessment: Overall WFL for tasks assessed    Lower Extremity Assessment Lower Extremity Assessment: LLE deficits/detail LLE Deficits / Details: Pt with 4+/5 B DF/PF and good quad set strength. Pt able to perform SLR with no extensor lag observed. LLE Sensation: WNL LLE Coordination: WNL    Cervical / Trunk Assessment Cervical / Trunk Assessment: Normal  Communication   Communication: No difficulties  Cognition Arousal/Alertness: Awake/alert Behavior During Therapy: WFL for tasks assessed/performed Overall Cognitive Status: Within Functional Limits for tasks assessed                                        General Comments      Exercises Total Joint Exercises Ankle Circles/Pumps: AROM;Both;20 reps;Supine Quad Sets: AROM;Left;5 reps;Supine   Assessment/Plan    PT Assessment Patient needs continued PT services  PT Problem List Decreased strength;Decreased range of motion;Decreased activity  tolerance;Decreased balance;Decreased mobility;Decreased knowledge of use of DME;Pain       PT Treatment Interventions DME instruction;Gait training;Stair training;Functional mobility training;Therapeutic activities;Therapeutic exercise;Balance training;Patient/family education    PT Goals (Current goals can be found in the Care Plan section)  Acute Rehab PT Goals Patient Stated Goal: go home tomorrow PT Goal Formulation: With patient Time For Goal Achievement: 12/14/20 Potential to Achieve Goals: Good    Frequency 7X/week   Barriers to discharge        Co-evaluation               AM-PAC PT "6 Clicks" Mobility  Outcome Measure Help needed turning from your back to your side while in a flat bed without using bedrails?: A Little Help needed moving from lying on your back to sitting on the side of a flat bed without using bedrails?: A Little Help needed moving to and from a bed to a chair (including a wheelchair)?: A Little Help needed standing up from a chair using your arms (e.g., wheelchair or bedside chair)?: A Little Help needed to walk in hospital room?: A Little Help needed climbing 3-5 steps with a railing? : A Lot 6 Click Score: 17    End of Session Equipment Utilized During Treatment: Gait belt Activity Tolerance: Patient tolerated treatment well;Treatment limited secondary to medical complications (Comment) (  symptomatic hypotension- lightheadedness) Patient left: in bed;with call bell/phone within reach;with bed alarm set Nurse Communication: Mobility status (pt symptoms and vitals during session, briefly present following BP readings) PT Visit Diagnosis: Unsteadiness on feet (R26.81);Muscle weakness (generalized) (M62.81);Pain Pain - Right/Left: Left Pain - part of body: Knee    Time: 5462-7035 PT Time Calculation (min) (ACUTE ONLY): 47 min   Charges:   PT Evaluation $PT Eval Low Complexity: 1 Low PT Treatments $Therapeutic Activity: 23-37 mins         Festus Barren PT, DPT  Acute Rehabilitation Services  Office [phone]  11/30/2020, 6:29 PM

## 2020-11-30 NOTE — Op Note (Signed)
NAME: Donald [name], Donald [name] MEDICAL RECORD NO: [id number] ACCOUNT NO: [id number] DATE OF BIRTH: 1945-04-15 FACILITY: [facility] LOCATION: [facility] PHYSICIAN: Johnn Hai, MD  Operative Report   DATE OF PROCEDURE: 11/30/2020  PREOPERATIVE DIAGNOSES:  End-stage osteoarthritis, left knee, varus deformity.  POSTOPERATIVE DIAGNOSES:  End-stage osteoarthritis, left knee, varus deformity.  PROCEDURE PERFORMED:  Left total knee arthroplasty utilizing Attune DePuy rotating platform, 6 femur, 7 tibia, 38 patella, 5 mm insert.  ANESTHESIA:  Spinal.  ASSISTANT:  Lacie Draft, PA  HISTORY:  A 75 year old with end-stage osteoarthrosis, varus deformity of the left knee.  The patient was indicated for replacement of the degenerated joint, failing conservative treatment.  Risks and benefits discussed including bleeding, infection,  damage to neurovascular structures, no change in symptoms, worsening symptoms, DVT, PE, anesthetic complications, etc.  TECHNIQUE:  The patient in supine position.  After induction of adequate spinal anesthesia, 2 grams Kefzol, left upper extremity was prepped and draped and exsanguinated in the usual sterile fashion.  Thigh tourniquet inflated to 250 mmHg.  Midline  incision was then made over the skin over the anterior aspect of the patella.  Full thickness flaps developed, median parapatellar arthrotomy performed.  Patella was gently everted, knee flexed.  Tricompartmental osteoarthrosis was noted, particularly  medial compartment bone-on-bone, on  the patellofemoral joint.  Remnants of the medial and lateral meniscus were excised.  I used Leksell rongeur to perform a notch above the femoral notch, medializing it and lateralizing it.  The patient had a fairly  small femoral canal.  I gently used a drill to gain access to the femoral canal.  This was then irrigated.  I then used a T-handled reamer and gently probed the canal.  It was intact.  I then put a 5-degree left, 10 off the  distal femur for a slight  flexion contracture.  I then pinned the distal femoral block.  I performed distal femoral cut, soft tissues protected at all times.  This was sized off the anterior cortex to be a 6.  This was pinned in 3 degrees of external rotation.  He had a  hypoplastic lateral femoral condyle.  I then used a 6 block.  This was then pinned and performed anterior, posterior and chamfer cuts.  Soft tissues were protected with curved Cregos at all times.  No notching of the femur occurred.  I then subluxed the  tibia.  The low side was posteromedially and sclerotic, 2 off the defect which was medial, measured approximately 9-10 off the high lateral side.  A 3-degree slope parallel to the shaft bisecting the tibiotalar joint.  This was then pinned and then  performed the tibial cut and drilled holes through sclerotic bone medially, protecting soft tissue.  I then used a 5 extension block and we had full extension and good stability.  I then re-flexed the knee, subluxed the tibia.  The patient was sized to a  7, maximizing our coverage.  Osteophytes were removed with a rongeur.  This was then pinned and harvested bone centrally, I then impacted into the femoral canal, drilled centrally and then a punch guide.  We turned our attention back to the femur.   Bisecting the canal slightly medializing it due to the hypoplastic lateral femoral condyle, pinned it for a box cut, then performed our box cut without difficulty, placed the trial femur and drilled the lug holes, 5 mm insert, had full extension, full  flexion, good stability to varus valgus stressing at 0 and 30 degrees,  negative anterior drawer.   Everted the patella, measured to a 25, planed to 15, with an oscillating saw.  Measured to a 38 using our trial paddle, which is parallel to the joint,  drilled our peg holes, medializing and then placed a trial patella, reduced it, had excellent patellofemoral tracking.  All instrumentation was then  removed.  Pulsatile lavage was used within the joint, checked posteriorly.  Popliteus and capsule was  intact. Cauterized geniculates with Aquamantys.  We then flexed the knee, all surfaces thoroughly dried.  Cement mixed on the back table under vacuum.  This was then injected into the tibia, digitally pressurizing it.  I placed cement on the tibial tray  and then impacted the tibial tray.  Redundant cement was removed.  I cemented and impacted the femur with cement on the femur, redundant cement removed.  I placed the 5 trial insert, reduced it, held axial load in extension throughout the curing of the  cement.  Redundant cement removed.  I cemented and clamped the patella as well.  0.25% Marcaine with epinephrine was then placed in the joint while allowing for the cement to cure and the wound was covered.  After appropriate curing of the cement,  patellar clamp was then removed.  The tourniquet was deflated.  Any minor bleeding was cauterized with the Aquamantys.  I had full flexion, full extension, good stability to varus valgus stressing at 0 and 30 degrees, negative anterior drawer.  Selected  the 5, this was then removed.  I then meticulously removed all redundant cement.  Copiously irrigated with pulsatile lavage and IrriSept.  I placed a 5 permanent insert and had reduced it. Again, good stability, full extension and full flexion.  Negative  anterior drawer.  It was irrigated with IrriSept.  Then, rinsed with a liter of pulsatile lavage.  Then, in slight flexion, reapproximated the patellar arthrotomy with 1 Vicryl interrupted figure-of-eight suture and then oversewn with a running  Stratafix.  Flexion to gravity at 90 degrees.  Irrigated subcutaneous tissues, subcu with 2-0 and skin with staples.  Wound was dressed sterilely, placed in immobilizer, transported to the recovery room in satisfactory condition.  The patient tolerated the procedure well.  No complications.  Assistant, Lacie Draft,  PA, was used throughout the case for patient positioning, closure, distraction of the joint, etc.  TOURNIQUET TIME:  71 minutes.   SHW D: 11/30/2020 10:06:22 am T: 11/30/2020 11:04:00 am  JOB: [id number]/ [id number]

## 2020-11-30 NOTE — Anesthesia Procedure Notes (Signed)
Spinal  Start time: 11/30/2020 7:29 AM End time: 11/30/2020 7:32 AM Reason for block: surgical anesthesia Staffing Performed: anesthesiologist  Anesthesiologist: Effie Berkshire, MD Preanesthetic Checklist Completed: patient identified, IV checked, site marked, risks and benefits discussed, surgical consent, monitors and equipment checked, pre-op evaluation and timeout performed Spinal Block Patient position: sitting Prep: DuraPrep and site prepped and draped Location: L3-4 Injection technique: single-shot Needle Needle type: Pencan  Needle gauge: 24 G Needle length: 10 cm Needle insertion depth: 10 cm Additional Notes Patient tolerated well. No immediate complications.

## 2020-12-01 ENCOUNTER — Other Ambulatory Visit (HOSPITAL_COMMUNITY): Payer: Self-pay

## 2020-12-01 LAB — CBC
HCT: 31.7 % — ABNORMAL LOW (ref 39.0–52.0)
Hemoglobin: 10.7 g/dL — ABNORMAL LOW (ref 13.0–17.0)
MCH: 30.3 pg (ref 26.0–34.0)
MCHC: 33.8 g/dL (ref 30.0–36.0)
MCV: 89.8 fL (ref 80.0–100.0)
Platelets: 173 10*3/uL (ref 150–400)
RBC: 3.53 MIL/uL — ABNORMAL LOW (ref 4.22–5.81)
RDW: 13.4 % (ref 11.5–15.5)
WBC: 12.9 10*3/uL — ABNORMAL HIGH (ref 4.0–10.5)
nRBC: 0 % (ref 0.0–0.2)

## 2020-12-01 LAB — BASIC METABOLIC PANEL
Anion gap: 4 — ABNORMAL LOW (ref 5–15)
BUN: 16 mg/dL (ref 8–23)
CO2: 26 mmol/L (ref 22–32)
Calcium: 8.6 mg/dL — ABNORMAL LOW (ref 8.9–10.3)
Chloride: 106 mmol/L (ref 98–111)
Creatinine, Ser: 0.86 mg/dL (ref 0.61–1.24)
GFR, Estimated: >60 mL/min (ref >60)
Glucose, Bld: 164 mg/dL — ABNORMAL HIGH (ref 70–99)
Potassium: 4.3 mmol/L (ref 3.5–5.1)
Sodium: 136 mmol/L (ref 135–145)

## 2020-12-01 MED ORDER — SODIUM CHLORIDE 0.9 % IV BOLUS
500.0000 mL | Freq: Once | INTRAVENOUS | Status: AC
  Administered 2020-12-01: 500 mL via INTRAVENOUS

## 2020-12-01 MED ORDER — SODIUM CHLORIDE 0.9 % IV SOLN: INTRAVENOUS | Status: DC

## 2020-12-01 NOTE — Care Plan (Signed)
Ortho Bundle Case Management Note  Patient Details  Name: Donald [name] MRN: [id number] Date of Birth: [birth year]  L TKA on 11-30-20 DCP:  Home with wife.  1 story home with 2 ste. DME:  No needs.  Has a RW and 3-in-1. PT: [facility].  PT eval scheduled on 12-05-20 at 10:30 am.                    DME Arranged:  N/A DME Agency:  NA  HH Arranged:  NA HH Agency:  NA  Additional Comments: Please contact me with any questions of if this plan should need to change.  Marianne Sofia, RN,CCM [facility]  [phone] 12/01/2020, 8:31 AM

## 2020-12-01 NOTE — Progress Notes (Signed)
Physical Therapy Treatment Patient Details Name: Donald [name] MRN: [id number] DOB: Mar 31, 1945 Today's Date: 12/01/2020   History of Present Illness Pt is a 75 y.o. male s/p L TKA on 11/30/20. PMH significant for CAD s/p CABG x5 , MI, OA,  RTCR (2004; 2007).    PT Comments    POD # 1 pm session Pt received 500cc Bolus.  Repeated BP's as a precaution. Supine         BP 123/59 Sitting          BP 108/62 Standing      BP  83/56 (a little light)  3 min           BP 80/48 (pt dizzy) Returned to room in recliner and assisted with urinal.   Pt did NOT meet goals to D/C to home today.  Reported to RN.  Recommendations for follow up therapy are one component of a multi-disciplinary discharge planning process, led by the attending physician.  Recommendations may be updated based on patient status, additional functional criteria and insurance authorization.  Follow Up Recommendations  Follow surgeon's recommendation for DC plan and follow-up therapies;Home health PT     Equipment Recommendations  None recommended by PT    Recommendations for Other Services       Precautions / Restrictions Precautions Precautions: Fall Precaution Comments: instructed no pillow under knee Restrictions Weight Bearing Restrictions: No LLE Weight Bearing: Weight bearing as tolerated Other Position/Activity Restrictions: WBAT     Mobility  Bed Mobility Overal bed mobility: Needs Assistance Bed Mobility: Supine to Sit     Supine to sit: Supervision;HOB elevated     General bed mobility comments: OOB in recliner    Transfers Overall transfer level: Needs assistance Equipment used: Rolling walker (2 wheeled) Transfers: Sit to/from Stand Sit to Stand: Min assist         General transfer comment: 50% VC's on proper hand placement and LE extension as well as safety with turns.  Ambulation/Gait Ambulation/Gait assistance: Min assist Gait Distance (Feet): 25 Feet Assistive device: Rolling  walker (2 wheeled) Gait Pattern/deviations: Step-to pattern;Decreased stride length;Decreased weight shift to left;Decreased stance time - left Gait velocity: decr   General Gait Details: distance limited by increased c/o diozziness.  Standing BP was 80/48   recliner brought to patient   Stairs             Wheelchair Mobility    Modified Rankin (Stroke Patients Only)       Balance                                            Cognition Arousal/Alertness: Awake/alert Behavior During Therapy: WFL for tasks assessed/performed Overall Cognitive Status: Within Functional Limits for tasks assessed                                 General Comments: AxO x 3 very pleasant and eager to go home      Exercises      General Comments        Pertinent Vitals/Pain Pain Assessment: 0-10 Pain Score: 3  Pain Location: Rt knee Pain Descriptors / Indicators: Constant;Sore;Operative site guarding;Tightness Pain Intervention(s): Monitored during session;Repositioned;Ice applied    Home Living  Prior Function            PT Goals (current goals can now be found in the care plan section) Progress towards PT goals: Progressing toward goals    Frequency    7X/week      PT Plan Current plan remains appropriate    Co-evaluation              AM-PAC PT "6 Clicks" Mobility   Outcome Measure  Help needed turning from your back to your side while in a flat bed without using bedrails?: A Little Help needed moving from lying on your back to sitting on the side of a flat bed without using bedrails?: A Little Help needed moving to and from a bed to a chair (including a wheelchair)?: A Little Help needed standing up from a chair using your arms (e.g., wheelchair or bedside chair)?: A Little Help needed to walk in hospital room?: A Lot Help needed climbing 3-5 steps with a railing? : A Lot 6 Click Score: 16    End  of Session Equipment Utilized During Treatment: Gait belt Activity Tolerance: Other (comment) (LOW BP) Patient left: in chair;with call bell/phone within reach Nurse Communication: Mobility status;Other (comment) (Orthostatic) PT Visit Diagnosis: Unsteadiness on feet (R26.81);Muscle weakness (generalized) (M62.81);Pain Pain - Right/Left: Left Pain - part of body: Knee     Time: 1345-1410 PT Time Calculation (min) (ACUTE ONLY): 25 min  Charges:  $Therapeutic Exercise: 8-22 mins $Therapeutic Activity: 8-22 mins                     {Tushar Enns  PTA Acute  Rehabilitation Services Pager      [phone] Office      [phone]

## 2020-12-01 NOTE — Progress Notes (Signed)
Physical Therapy Treatment Patient Details Name: Donald [name] MRN: [id number] DOB: 1945/07/01 Today's Date: 12/01/2020   History of Present Illness Pt is a 75 y.o. male s/p L TKA on 11/30/20. PMH significant for CAD s/p CABG x5 , MI, OA,  RTCR (2004; 2007).    PT Comments     POD # 1 am session Pt wad issues with BP during yesterday's PT session, so monitored this session. EOB         BP  106/65   HR 68 Standing   BP   64/44   HR 88 Pt c/o feeling dizzy Assisted to recliner and RN called to room Performed a few TE's followed by ICE Pt will need another PT session to increase mobility and practice stairs.    Recommendations for follow up therapy are one component of a multi-disciplinary discharge planning process, led by the attending physician.  Recommendations may be updated based on patient status, additional functional criteria and insurance authorization.  Follow Up Recommendations  Follow surgeon's recommendation for DC plan and follow-up therapies;Home health PT     Equipment Recommendations  None recommended by PT    Recommendations for Other Services       Precautions / Restrictions Precautions Precautions: Fall Precaution Comments: instructed no pillow under knee Restrictions Weight Bearing Restrictions: No LLE Weight Bearing: Weight bearing as tolerated     Mobility  Bed Mobility Overal bed mobility: Needs Assistance Bed Mobility: Supine to Sit     Supine to sit: Supervision;HOB elevated     General bed mobility comments: demonstarted and instructed how to use a belt to self assist LE    Transfers Overall transfer level: Needs assistance Equipment used: Rolling walker (2 wheeled) Transfers: Sit to/from Stand Sit to Stand: Min assist         General transfer comment: 50% VC's on proper hand placement and LE extension as well as safety with turns.  Ambulation/Gait             General Gait Details: did not amb this session due to orthostatic  BP's with + dizzy/flush   Stairs             Wheelchair Mobility    Modified Rankin (Stroke Patients Only)       Balance                                            Cognition Arousal/Alertness: Awake/alert Behavior During Therapy: WFL for tasks assessed/performed Overall Cognitive Status: Within Functional Limits for tasks assessed                                 General Comments: AxO x 3 very pleasant and eager to go home      Exercises  Total Knee Replacement TE's following HEP handout 10 reps B LE ankle pumps 05 reps towel squeezes 05 reps knee presses 05 reps heel slides   Educated on use of gait belt to assist with TE's Followed by ICE     General Comments        Pertinent Vitals/Pain Pain Assessment: 0-10 Pain Score: 5  Pain Location: Rt knee Pain Descriptors / Indicators: Constant;Sore;Operative site guarding;Tightness Pain Intervention(s): Monitored during session;Repositioned;Ice applied    Home Living  Prior Function            PT Goals (current goals can now be found in the care plan section) Progress towards PT goals: Progressing toward goals    Frequency    7X/week      PT Plan Current plan remains appropriate    Co-evaluation              AM-PAC PT "6 Clicks" Mobility   Outcome Measure  Help needed turning from your back to your side while in a flat bed without using bedrails?: A Little Help needed moving from lying on your back to sitting on the side of a flat bed without using bedrails?: A Little Help needed moving to and from a bed to a chair (including a wheelchair)?: A Little Help needed standing up from a chair using your arms (e.g., wheelchair or bedside chair)?: A Little Help needed to walk in hospital room?: A Lot Help needed climbing 3-5 steps with a railing? : A Lot 6 Click Score: 16    End of Session Equipment Utilized During Treatment: Gait  belt Activity Tolerance: Other (comment) (orthostatic BP's) Patient left: in chair;with call bell/phone within reach Nurse Communication: Mobility status;Other (comment) (BP's) PT Visit Diagnosis: Unsteadiness on feet (R26.81);Muscle weakness (generalized) (M62.81);Pain Pain - Right/Left: Left Pain - part of body: Knee     Time: 1000-1025 PT Time Calculation (min) (ACUTE ONLY): 25 min  Charges:  $Therapeutic Exercise: 8-22 mins $Therapeutic Activity: 8-22 mins                     Rica Koyanagi  PTA Acute  Rehabilitation Services Pager      [phone] Office      [phone]

## 2020-12-01 NOTE — TOC Transition Note (Signed)
Transition of Care [facility]) - CM/SW Discharge Note  Patient Details  Name: Donald [name] MRN: [id number] Date of Birth: Aug 03, 1945  [facility] CM/SW Contact:  Sherie Don, LCSW Phone Number: 12/01/2020, 9:42 AM  Clinical Narrative: Patient is expected to discharge home after working with PT. CSW met with patient to review discharge plan. Patient will discharge home with OPPT at [facility] with the first appointment scheduled for 12/05/20. Patient has a rolling walker and 3N1 at home, so there are no DME needs at this time. TOC signing off.  Final next level of care: OP Rehab Barriers to Discharge: No Barriers Identified  Patient Goals and CMS Choice Patient states their goals for this hospitalization and ongoing recovery are:: Discharge home with [place] CMS Medicare.gov Compare Post Acute Care list provided to:: Patient Choice offered to / list presented to : NA  Discharge Plan and Services        DME Arranged: N/A DME Agency: NA HH Arranged: NA [place] Agency: NA  Readmission Risk Interventions No flowsheet data found.

## 2020-12-01 NOTE — Progress Notes (Addendum)
Subjective: 1 Day Post-Op Procedure(s) (LRB): TOTAL KNEE ARTHROPLASTY (Left) Patient reports pain as 3 on 0-10 scale.   Denies CP or SOB.  Voiding without difficulty. Positive flatus. No chest pain shortness of breath. Objective: Vital signs in last 24 hours: Temp:  [97.4 F (36.3 C)-99.6 F (37.6 C)] 98.7 F (37.1 C) (09/29 0505) Pulse Rate:  [54-84] 62 (09/29 0505) Resp:  [10-21] 16 (09/29 0505) BP: (102-121)/(38-67) 110/50 (09/29 0505) SpO2:  [95 %-100 %] 98 % (09/29 0505) Weight:  [79.5 kg] 79.5 kg (09/28 1449)  Intake/Output from previous day: 09/28 0701 - 09/29 0700 In: 3881.2 [P.O.:1130; I.V.:2301.2; IV Piggyback:450] Out: 3300 [Urine:3175; Blood:125] Intake/Output this shift: No intake/output data recorded.  Recent Labs    12/01/20 0322  HGB 10.7*   Recent Labs    12/01/20 0322  WBC 12.9*  RBC 3.53*  HCT 31.7*  PLT 173   Recent Labs    12/01/20 0322  NA 136  K 4.3  CL 106  CO2 26  BUN 16  CREATININE 0.86  GLUCOSE 164*  CALCIUM 8.6*   No results for input(s): LABPT, INR in the last 72 hours.  Neurologically intact ABD soft Neurovascular intact Sensation intact distally Intact pulses distally Dorsiflexion/Plantar flexion intact Compartment soft   Assessment/Plan:  1 Day Post-Op Procedure(s) (LRB): TOTAL KNEE ARTHROPLASTY (Left) Fluid Bolus as was orthostatic for PT  Advance diet Up with therapy Plan for discharge tomorrow   Active Problems:   S/P TKR (total knee replacement) using cement      Johnn Hai 12/01/2020, @NOW 

## 2020-12-01 NOTE — Anesthesia Postprocedure Evaluation (Signed)
Anesthesia Post Note  Patient: Donald [name]  Procedure(s) Performed: TOTAL KNEE ARTHROPLASTY (Left: Knee)     Patient location during evaluation: PACU Anesthesia Type: Spinal Level of consciousness: oriented and awake and alert Pain management: pain level controlled Vital Signs Assessment: post-procedure vital signs reviewed and stable Respiratory status: spontaneous breathing, respiratory function stable and patient connected to nasal cannula oxygen Cardiovascular status: blood pressure returned to baseline and stable Postop Assessment: no headache, no backache and no apparent nausea or vomiting Anesthetic complications: no   No notable events documented.  Last Vitals:  Vitals:   12/01/20 0111 12/01/20 0505  BP: (!) 102/51 (!) 110/50  Pulse: 61 62  Resp: 16 16  Temp: 36.8 C 37.1 C  SpO2: 98% 98%    Last Pain:  Vitals:   12/01/20 0551  TempSrc:   PainSc: Asleep                 Effie Berkshire

## 2020-12-02 ENCOUNTER — Other Ambulatory Visit (HOSPITAL_COMMUNITY): Payer: Self-pay

## 2020-12-02 ENCOUNTER — Inpatient Hospital Stay (HOSPITAL_COMMUNITY): Payer: Medicare HMO

## 2020-12-02 DIAGNOSIS — I4891 Unspecified atrial fibrillation: Secondary | ICD-10-CM

## 2020-12-02 DIAGNOSIS — Z96652 Presence of left artificial knee joint: Secondary | ICD-10-CM | POA: Diagnosis not present

## 2020-12-02 DIAGNOSIS — E785 Hyperlipidemia, unspecified: Secondary | ICD-10-CM | POA: Diagnosis not present

## 2020-12-02 DIAGNOSIS — Z820 Family history of epilepsy and other diseases of the nervous system: Secondary | ICD-10-CM | POA: Diagnosis not present

## 2020-12-02 DIAGNOSIS — Z8679 Personal history of other diseases of the circulatory system: Secondary | ICD-10-CM

## 2020-12-02 DIAGNOSIS — Z720 Tobacco use: Secondary | ICD-10-CM | POA: Diagnosis not present

## 2020-12-02 DIAGNOSIS — I951 Orthostatic hypotension: Secondary | ICD-10-CM | POA: Diagnosis not present

## 2020-12-02 DIAGNOSIS — T827XXA Infection and inflammatory reaction due to other cardiac and vascular devices, implants and grafts, initial encounter: Secondary | ICD-10-CM | POA: Diagnosis not present

## 2020-12-02 DIAGNOSIS — L03113 Cellulitis of right upper limb: Secondary | ICD-10-CM | POA: Diagnosis not present

## 2020-12-02 DIAGNOSIS — N400 Enlarged prostate without lower urinary tract symptoms: Secondary | ICD-10-CM | POA: Diagnosis not present

## 2020-12-02 DIAGNOSIS — D649 Anemia, unspecified: Secondary | ICD-10-CM | POA: Diagnosis not present

## 2020-12-02 DIAGNOSIS — M1712 Unilateral primary osteoarthritis, left knee: Secondary | ICD-10-CM | POA: Diagnosis not present

## 2020-12-02 DIAGNOSIS — Z7982 Long term (current) use of aspirin: Secondary | ICD-10-CM | POA: Diagnosis not present

## 2020-12-02 DIAGNOSIS — I480 Paroxysmal atrial fibrillation: Secondary | ICD-10-CM

## 2020-12-02 DIAGNOSIS — M21162 Varus deformity, not elsewhere classified, left knee: Secondary | ICD-10-CM | POA: Diagnosis not present

## 2020-12-02 DIAGNOSIS — I2510 Atherosclerotic heart disease of native coronary artery without angina pectoris: Secondary | ICD-10-CM | POA: Diagnosis not present

## 2020-12-02 DIAGNOSIS — T801XXA Vascular complications following infusion, transfusion and therapeutic injection, initial encounter: Secondary | ICD-10-CM | POA: Diagnosis not present

## 2020-12-02 DIAGNOSIS — Z808 Family history of malignant neoplasm of other organs or systems: Secondary | ICD-10-CM | POA: Diagnosis not present

## 2020-12-02 DIAGNOSIS — Z8249 Family history of ischemic heart disease and other diseases of the circulatory system: Secondary | ICD-10-CM | POA: Diagnosis not present

## 2020-12-02 DIAGNOSIS — Z8719 Personal history of other diseases of the digestive system: Secondary | ICD-10-CM | POA: Diagnosis not present

## 2020-12-02 DIAGNOSIS — E860 Dehydration: Secondary | ICD-10-CM | POA: Diagnosis not present

## 2020-12-02 DIAGNOSIS — Z79899 Other long term (current) drug therapy: Secondary | ICD-10-CM | POA: Diagnosis not present

## 2020-12-02 DIAGNOSIS — I4892 Unspecified atrial flutter: Secondary | ICD-10-CM | POA: Diagnosis not present

## 2020-12-02 DIAGNOSIS — I252 Old myocardial infarction: Secondary | ICD-10-CM | POA: Diagnosis not present

## 2020-12-02 DIAGNOSIS — Z951 Presence of aortocoronary bypass graft: Secondary | ICD-10-CM

## 2020-12-02 DIAGNOSIS — Z85828 Personal history of other malignant neoplasm of skin: Secondary | ICD-10-CM | POA: Diagnosis not present

## 2020-12-02 DIAGNOSIS — I4819 Other persistent atrial fibrillation: Secondary | ICD-10-CM | POA: Diagnosis not present

## 2020-12-02 DIAGNOSIS — I9789 Other postprocedural complications and disorders of the circulatory system, not elsewhere classified: Secondary | ICD-10-CM | POA: Diagnosis not present

## 2020-12-02 DIAGNOSIS — Y828 Other medical devices associated with adverse incidents: Secondary | ICD-10-CM | POA: Diagnosis not present

## 2020-12-02 DIAGNOSIS — I10 Essential (primary) hypertension: Secondary | ICD-10-CM | POA: Diagnosis not present

## 2020-12-02 LAB — CBC
HCT: 32 % — ABNORMAL LOW (ref 39.0–52.0)
Hemoglobin: 10.7 g/dL — ABNORMAL LOW (ref 13.0–17.0)
MCH: 30 pg (ref 26.0–34.0)
MCHC: 33.4 g/dL (ref 30.0–36.0)
MCV: 89.6 fL (ref 80.0–100.0)
Platelets: 153 10*3/uL (ref 150–400)
RBC: 3.57 MIL/uL — ABNORMAL LOW (ref 4.22–5.81)
RDW: 13.6 % (ref 11.5–15.5)
WBC: 10.9 10*3/uL — ABNORMAL HIGH (ref 4.0–10.5)
nRBC: 0 % (ref 0.0–0.2)

## 2020-12-02 LAB — BASIC METABOLIC PANEL WITH GFR
Anion gap: 7 (ref 5–15)
BUN: 14 mg/dL (ref 8–23)
CO2: 24 mmol/L (ref 22–32)
Calcium: 8.2 mg/dL — ABNORMAL LOW (ref 8.9–10.3)
Chloride: 106 mmol/L (ref 98–111)
Creatinine, Ser: 0.77 mg/dL (ref 0.61–1.24)
GFR, Estimated: >60 mL/min
Glucose, Bld: 106 mg/dL — ABNORMAL HIGH (ref 70–99)
Potassium: 4.1 mmol/L (ref 3.5–5.1)
Sodium: 137 mmol/L (ref 135–145)

## 2020-12-02 LAB — TROPONIN I (HIGH SENSITIVITY)
Troponin I (High Sensitivity): 117 ng/L
Troponin I (High Sensitivity): 180 ng/L

## 2020-12-02 LAB — ECHOCARDIOGRAM LIMITED
Height: 65 in
Weight: 2804.25 [oz_av]

## 2020-12-02 MED ORDER — METOPROLOL TARTRATE 25 MG PO TABS: 12.5000 mg | ORAL_TABLET | Freq: Two times a day (BID) | ORAL | Status: DC

## 2020-12-02 MED ORDER — DILTIAZEM HCL-DEXTROSE 125-5 MG/125ML-% IV SOLN (PREMIX)
5.0000 mg/h | INTRAVENOUS | Status: DC
  Administered 2020-12-02: 5 mg/h via INTRAVENOUS
  Filled 2020-12-02 (×2): qty 125

## 2020-12-02 MED ORDER — ATORVASTATIN CALCIUM 40 MG PO TABS
40.0000 mg | ORAL_TABLET | Freq: Every day | ORAL | Status: DC
  Administered 2020-12-02 – 2020-12-09 (×8): 40 mg via ORAL
  Filled 2020-12-02 (×8): qty 1

## 2020-12-02 MED ORDER — SODIUM CHLORIDE 0.9 % IV BOLUS
250.0000 mL | Freq: Once | INTRAVENOUS | Status: AC
  Administered 2020-12-02: 250 mL via INTRAVENOUS

## 2020-12-02 MED ORDER — APIXABAN 5 MG PO TABS
5.0000 mg | ORAL_TABLET | Freq: Two times a day (BID) | ORAL | Status: DC
  Administered 2020-12-02 – 2020-12-10 (×16): 5 mg via ORAL
  Filled 2020-12-02 (×8): qty 1
  Filled 2020-12-02: qty 2
  Filled 2020-12-02 (×5): qty 1
  Filled 2020-12-02: qty 2
  Filled 2020-12-02: qty 1

## 2020-12-02 NOTE — Consult Note (Signed)
Cardiology Consultation:   Patient ID: Donald [name] MRN: [id number]; DOB: 1945/08/27  Admit date: 11/30/2020 Date of Consult: 12/02/2020  PCP:  Ma Hillock, DO   [facility] Providers Cardiologist:  Larae Grooms, MD  Electrophysiologist:  Thompson Grayer, MD  {   Patient Profile:   Donald [name] is a 75 y.o. male with a with a PMH of CAD s/p CABG in 2001, atrial flutter s/p ablation in 2011 no longer on anticoagulation, hypertension, hyperlipidemia, renal cancer s/p left nephrectomy 02/2019, who is being seen 12/02/2020 for the evaluation of atrial fibrillation at the request of Dr. Marylyn Ishihara.  History of Present Illness:   Mr. Teagle was last evaluated by cardiology and an outpatient visit with Dr. Irish Lack 11/01/2020 for preoperative evaluation in anticipation of upcoming TKR.  He was without anginal complaints or palpitations at that time.  He was felt to be acceptable risk for surgery without further cardiac testing.  His last echocardiogram in 12/2019 showed EF 55-60%, no RWMA, normal LV diastolic function, normal RV size/function, and no significant valvular abnormalities.  He has not had any recent ischemic testing.  He presented to the hospital 11/30/2020 for planned left TKR.  He was recovering well and working with physical therapy though did note some dizziness and hypotension for which he was given IV fluids.  12/02/2020 he had another episode of hypotension while working with PT, again given IV fluids however tachycardia remained.  EKG obtained showing atrial fibrillation with RVR with rate 135 with anteroseptal/lateral ST abnormalities.  He was transferred to a telemetry floor.  Troponins 117>180.  Echocardiogram showed EF 70-75%, indeterminate LV diastolic function, no RWMA, normal RV size/function, and no significant valvular abnormalities.  Cardiology asked to evaluate.  At the time of my evaluation he is sitting in the bedside chair in NAD. He reported intermittent dizziness  when working with PT but no dizziness/lightheadedness/pre-syncope at rest. He reported yesterday evening he had a pre-syncopal episode with standing which he attributed to the pain in his knee. He is unaware of any palpitations or racing heart beats. He denies any chest pain or SOB. He believes this is his first reoccurrence of atrial fibrillation since his ablation in 2011. He has not had any exertional CP, SOB, DOE, orthopnea, PND, LE edema, or syncope in the past several months.    Past Medical History:  Diagnosis Date   Age-related nuclear cataract, bilateral 06/29/2020   Allergic rhinitis    BPH (benign prostatic hyperplasia)    Chicken pox    Coronary artery disease    a. MI 2003 with CABG.   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    Osteoarthritis of knee 05/13/2017   Renal cell carcinoma ([place]) 02/04/2019   Partial nephrectomyKIDNEY, LEFT, PARTIAL NEPHRECTOMY:    Right ureteral stone    S/P ablation of atrial flutter    followed by dr Rayann Heman (EP cardiolgoist)  ablation 04-17-2004  and by dr allred 02-10- 2011 was successful   S/P CABG x 5 08-05-2001   in [place] , [place]   LIMA to LAD,  seqVG to Diagonal,  SeqVG to OM,  seqVG to PDA and PLA  Seasonal allergies    Sinus bradycardia    Wears glasses     Past Surgical History:  Procedure Laterality Date   CARDIAC CATHETERIZATION  08-04-2001  in [place], [place]   severe 3 vessel cad   CARDIAC ELECTROPHYSIOLOGY [place]  x2  04-17-2004 [place], [place]);  04-14-2009 by dr Rayann Heman   per dr allred report-- successful ablation clockwise isthmus-dependent right atrial flutter along the usual cavotricupid isthmus;  complete bidirectionl isthmus block achieved   CORONARY  ARTERY BYPASS GRAFT  08-06-2003  in [place], [place]   x5-- LIMA - LAD,  seqVG to Diagonal, OM, PDA, and PLA   CYSTOSCOPY WITH RETROGRADE PYELOGRAM, URETEROSCOPY AND STENT PLACEMENT Right 08/10/2016   Procedure: CYSTOSCOPY WITH RETROGRADE PYELOGRAM, URETEROSCOPY AND STENT PLACEMENT;  Surgeon: Alexis Frock, MD;  Location: [facility];  Service: Urology;  Laterality: Right;   FIXATION RIGHT WRIST Right 2011   stability for arthritis   INGUINAL HERNIA REPAIR Right 08-08-2015   [facility] care at [place] Left 02/04/2019   Procedure: XI ROBOTIC ASSITED LEFT PARTIAL NEPHRECTOMY; LEFT PARTIAL ADRENALECTOMY;  Surgeon: Alexis Frock, MD;  Location: [facility];  Service: Urology;  Laterality: Left;  3 HRS   ROTATOR CUFF REPAIR Right 2004;  2007   WISDOM TOOTH EXTRACTION       Home Medications:  Prior to Admission medications   Medication Sig Start Date End Date Taking? Authorizing Provider  aspirin 81 MG chewable tablet Chew 81 mg by mouth at bedtime.   Yes [provider]  aspirin EC 81 MG tablet Take 1 tablet (81 mg total) by mouth 2 (two) times daily after a meal. Day after surgery 11/30/20  Yes Susa Day, MD  atorvastatin (LIPITOR) 40 MG tablet Take 40 mg by mouth at bedtime.    Yes [provider]  docusate sodium (COLACE) 100 MG capsule Take 1 capsule (100 mg total) by mouth 2 (two) times daily as needed for mild constipation. 11/30/20  Yes Susa Day, MD  ibuprofen (ADVIL) 800 MG tablet Take 800 mg by mouth every 8 (eight) hours as needed for mild pain or moderate pain. 09/15/20  Yes [provider]  Multiple Vitamin (MULTIVITAMIN ADULT PO) Take 1 tablet by mouth daily.   Yes [provider]  Omega-3 Fatty Acids (FISH OIL) 1000 MG CAPS Take 1,000 mg by mouth daily.   Yes [provider]  oxyCODONE (OXY IR/ROXICODONE) 5 MG immediate release tablet Take 1-2 tablets (5-10 mg total) by  mouth every 4 (four) hours as needed for severe pain. 11/30/20  Yes Susa Day, MD  polyethylene glycol (MIRALAX / GLYCOLAX) 17 g packet Take 17 g by mouth daily. 11/30/20  Yes Susa Day, MD  Red Yeast Rice 600 MG CAPS Take 600 mg by mouth daily.   Yes [provider]  midodrine (PROAMATINE) 2.5 MG tablet Take 1 tablet (2.5 mg total) by mouth 3 (three) times daily with meals as needed. Patient not taking: No sig reported 01/14/20   Jettie Booze, MD    Inpatient Medications: Scheduled Meds:  acidophilus  1 capsule Oral Daily   apixaban  5 mg Oral BID   docusate sodium  100 mg Oral BID   metoprolol tartrate  12.5 mg Oral BID   multivitamin with minerals   Oral Daily   Continuous Infusions:  sodium chloride 250 mL/hr at 12/02/20 1145   diltiazem (CARDIZEM) infusion     methocarbamol (ROBAXIN)  IV 500 mg (11/30/20 1033)   PRN Meds: acetaminophen, alum & mag hydroxide-simeth, bisacodyl, diphenhydrAMINE, HYDROmorphone (DILAUDID) injection, menthol-cetylpyridinium **OR** phenol, methocarbamol **OR** methocarbamol (ROBAXIN) IV, metoCLOPramide **OR** metoCLOPramide (REGLAN) injection, midodrine, ondansetron **OR** ondansetron (ZOFRAN) IV, oxyCODONE, oxyCODONE, polyethylene glycol  Allergies:   No Known Allergies  Social History:   Social History   Socioeconomic History   Marital status: Married    Spouse name: Not on file   Number of children: Not on file   Years of education: Not on file   Highest education level: Not on file  Occupational History   Occupation: retired  Tobacco Use   Smoking status: Never   Smokeless tobacco: Current    Types: Snuff   Tobacco comments:    08-02-2016 ~ dip tobacco for 30 yrs  Vaping Use   Vaping Use: Never used  Substance and Sexual Activity   Alcohol use: Yes    Comment: occ.    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; Geneticist, molecular Situation: lives with wife      Spiritual Beliefs: Christian      Lifestyle: tries to make it to the gym 5 days per week; healthy diet     -smoke alarm in the home:Yes     - wears seatbelt: Yes     - Feels safe in their relationships: Yes         Social Determinants of Health   Financial Resource Strain: Low Risk    Difficulty of Paying Living Expenses: Not hard at all  Food Insecurity: No Food Insecurity   Worried About Charity fundraiser in the Last Year: Never true   [place] in the Last Year: Never true  Transportation Needs: No Transportation Needs   Lack of Transportation (Medical): No   Lack of Transportation (Non-Medical): No  Physical Activity: Sufficiently Active   Days of Exercise per Week: 4 days   Minutes of Exercise per Session: 60 min  Stress: No Stress Concern Present   Feeling of Stress : Not at all  Social Connections: Moderately Isolated   Frequency of Communication with Friends and Family: More than three times a week   Frequency of Social Gatherings with Friends and Family: More than three times a week   Attends Religious Services: Never   Marine scientist or Organizations: No   Attends Music therapist: Never   Marital Status: Married  Human resources officer Violence: Not At Risk   Fear of Current or Ex-Partner: No   Emotionally Abused: No   Physically Abused: No   Sexually Abused: No    Family History:    Family History  Problem Relation Age of Onset   Heart attack Mother    Alzheimer's disease Mother    Cancer Father        throat cancer, chewing tobacco   Hypertension Neg Hx      ROS:  Please see the history of present illness.   All other ROS reviewed and negative.     Physical Exam/Data:   Vitals:   12/02/20 1127 12/02/20 1200 12/02/20 1347 12/02/20 1713  BP: (!) 80/60 110/60 (!) 111/56 108/67  Pulse: (!) 134 (!) 110 (!) 130 (!) 118  Resp: 16  16 18  (!) 21  Temp:   99 F (37.2 C) 98.6 F (37 C)  TempSrc:    Oral  SpO2:   98%   Weight:      Height:        Intake/Output Summary (Last 24 hours) at 12/02/2020 1730 Last data filed at 12/02/2020 1515 Gross per 24 hour  Intake 1576.68 ml  Output 2240 ml  Net -663.32 ml   Last 3 Weights 11/30/2020 11/22/2020 11/01/2020  Weight (lbs) 175 lb 4.3 oz 156 lb 6.4 oz 159 lb  Weight (kg) 79.5 kg 70.943 kg 72.122 kg     Body mass index is 29.17 kg/m.  General:  Well nourished, well developed, in no acute distress HEENT: sclera anicteric Neck: no JVD Vascular: No carotid bruits; Distal pulses 2+ bilaterally Cardiac:  normal S1, S2; IRIR; no murmurs, rubs, or gallops Lungs:  clear to auscultation bilaterally, no wheezing, rhonchi or rales  Abd: soft, nontender, no hepatomegaly  Ext: no edema; LLE is wrapped  Musculoskeletal:  No deformities, BUE and BLE strength normal and equal Skin: warm and dry  Neuro:  CNs 2-12 intact, no focal abnormalities noted Psych:  Normal affect   EKG:  The EKG was personally reviewed and demonstrates:  atrial fibrillation with RVR with rate 135 with anteroseptal/lateral ST abnormalities. Telemetry:  Telemetry was personally reviewed and demonstrates:  Not currently on telemetry as he was on an ortho floor.  Relevant CV Studies: Echocardiogram 12/2019: 1. Left ventricular ejection fraction, by estimation, is 55 to 60%. The  left ventricle has normal function. The left ventricle has no regional  wall motion abnormalities. Left ventricular diastolic parameters were  normal.   2. Right ventricular systolic function is normal. The right ventricular  size is normal.   3. The mitral valve is normal in structure. Trivial mitral valve  regurgitation. No evidence of mitral stenosis.   4. The aortic valve is tricuspid. Aortic valve regurgitation is not  visualized. No aortic stenosis is present.   5. The inferior vena cava is normal in size with greater than  50%  respiratory variability, suggesting right atrial pressure of 3 mmHg.   Laboratory Data:  High Sensitivity Troponin:   Recent Labs  Lab 12/02/20 1339 12/02/20 1527  TROPONINIHS 117* 180*     Chemistry Recent Labs  Lab 12/01/20 0322 12/02/20 1159  NA 136 137  K 4.3 4.1  CL 106 106  CO2 26 24  GLUCOSE 164* 106*  BUN 16 14  CREATININE 0.86 0.77  CALCIUM 8.6* 8.2*  GFRNONAA >60 >60  ANIONGAP 4* 7    No results for input(s): PROT, ALBUMIN, AST, ALT, ALKPHOS, BILITOT in the last 168 hours. Lipids No results for input(s): CHOL, TRIG, HDL, LABVLDL, LDLCALC, CHOLHDL in the last 168 hours.  Hematology Recent Labs  Lab 12/01/20 0322 12/02/20 0324  WBC 12.9* 10.9*  RBC 3.53* 3.57*  HGB 10.7* 10.7*  HCT 31.7* 32.0*  MCV 89.8 89.6  MCH 30.3 30.0  MCHC 33.8 33.4  RDW 13.4 13.6  PLT 173 153   Thyroid No results for input(s): TSH, FREET4 in the last 168 hours.  BNPNo results for input(s): BNP, PROBNP in the last 168 hours.  DDimer No results for input(s): DDIMER in the last 168 hours.   Radiology/Studies:  DG Knee 1-2 Views Left  Result Date: 11/30/2020 CLINICAL DATA:  Post left knee replacement EXAM: LEFT KNEE - 1-2 VIEW COMPARISON:  None. FINDINGS: Changes of left knee replacement. Soft tissue and joint space gas. No hardware  bony complicating feature. IMPRESSION: Left knee replacement.  No visible complicating feature. Electronically Signed   By: Rolm Baptise M.D.   On: 11/30/2020 13:20   ECHOCARDIOGRAM LIMITED  Result Date: 12/02/2020    ECHOCARDIOGRAM REPORT   Patient Name:   Donald [name] Date of Exam: 12/02/2020 Medical Rec #:  [id number]     Height:       65.0 in Accession #:    [id number]    Weight:       175.3 lb Date of Birth:  [birth year]      BSA:          1.870 m Patient Age:    74 years      BP:           111/56 mmHg Patient Gender: M             HR:           130 bpm. Exam Location:  Inpatient Procedure: Limited Echo and Color Doppler Indications:    I48.91*  Unspecified atrial fibrillation  History:        Patient has prior history of Echocardiogram examinations, most                 recent 01/01/2020. Prior CABG, Arrythmias:Atrial Fibrillation;                 Risk Factors:Dyslipidemia.  Sonographer:    Raquel Sarna Senior RDCS Referring Phys: [id number] Man  1. Left ventricular ejection fraction, by estimation, is 70 to 75%. The left ventricle has hyperdynamic function. The left ventricle has no regional wall motion abnormalities. Left ventricular diastolic function could not be evaluated.  2. Right ventricular systolic function is normal. The right ventricular size is normal.  3. The mitral valve is grossly normal. No evidence of mitral valve regurgitation.  4. The aortic valve is tricuspid. Aortic valve regurgitation is not visualized. No aortic stenosis is present.  5. The inferior vena cava is normal in size with <50% respiratory variability, suggesting right atrial pressure of 8 mmHg. Comparison(s): Changes from prior study are noted. 01/01/2020: LVEF 55-60%, normal atrial size. FINDINGS  Left Ventricle: Left ventricular ejection fraction, by estimation, is 70 to 75%. The left ventricle has hyperdynamic function. The left ventricle has no regional wall motion abnormalities. The left ventricular internal cavity size was normal in size. There is borderline left ventricular hypertrophy. Left ventricular diastolic function could not be evaluated due to atrial fibrillation. Left ventricular diastolic function could not be evaluated. Right Ventricle: The right ventricular size is normal. No increase in right ventricular wall thickness. Right ventricular systolic function is normal. Left Atrium: Left atrial size was normal in size. Right Atrium: Right atrial size was normal in size. Pericardium: There is no evidence of pericardial effusion. Mitral Valve: The mitral valve is grossly normal. No evidence of mitral valve regurgitation. Tricuspid Valve: The  tricuspid valve is grossly normal. Tricuspid valve regurgitation is trivial. Aortic Valve: The aortic valve is tricuspid. Aortic valve regurgitation is not visualized. No aortic stenosis is present. Pulmonic Valve: The pulmonic valve was grossly normal. Pulmonic valve regurgitation is not visualized. Aorta: The aortic root and ascending aorta are structurally normal, with no evidence of dilitation. Venous: The inferior vena cava is normal in size with less than 50% respiratory variability, suggesting right atrial pressure of 8 mmHg. IAS/Shunts: No atrial level shunt detected by color flow Doppler. EKG: Rhythm strip during this exam demostrated AFib wtih RVR. [facility]  MD Electronically signed by Lyman Bishop MD Signature Date/Time: 12/02/2020/3:56:58 PM    Final      Assessment and Plan:   1.  Atrial fibrillation with RVR patient with history of atrial flutter s/p ablation in 2011: Patient here for planned TKR which occurred 07/05/7739 without complications.  Over the past 2 days he has had a couple episodes of dizziness and hypotension when working with physical therapy.  He was given IV fluids on both occasions however today was noted to be persistently tachycardic.  EKG showed atrial fibrillation with rate 135 bpm.  This appears to be his first recurrence following an ablation in 2011 for management of atrial flutter.  He has not been on anticoagulation. He continues to have tachycardia on exam with IRIR. Echocardiogram with hyperdynamic LV function and no RWMA or valvular abnormalities. - Can continue diltiazem as BP will allow - monitor closely given baseline bradycardia history when in sinus rhythm. - CHA2DS2-VASc Score = 4 [CHF History: 0, HTN History: 1, Diabetes History: 0, Stroke History: 0, Vascular Disease History: 1, Age Score: 2, Gender Score: 0].  Therefore, the patient's annual risk of stroke is 4.8 %.    - Spoke with [place], [place] with ortho who cleared to start anticoagulation - Will start  eliquis 5mg  BID for stroke ppx. - Would benefit from reconnection with EP outpatient  2. CAD s/p CABG in 2001: no recent chest pain complaints. EKG does show some STD in anteroseptal/lateral leads in the setting of atrial fibrillation with RVR. HsTrop 117- not terribly surprising given recent surgery and elevated HR's - Await troponin trend - Will stop aspirin at this time - Continue statin  3. HTN: also with history of orthostatic hypotension. BP has been intermittently soft this admission - Continue diltiazem for now for rate control as above - Encourage compression stockings  4. HLD:  - Continue atorvastatin  5. S/p left TKR: underwent surgery 11/30/20 without immediate complications. Recovering well - Continue management per ortho     Risk Assessment/Risk Scores:   CHA2DS2-VASc Score = 4   This indicates a 4.8% annual risk of stroke. The patient's score is based upon: CHF History: 0 HTN History: 1 Diabetes History: 0 Stroke History: 0 Vascular Disease History: 1 Age Score: 2 Gender Score: 0     For questions or updates, please contact [facility] Please consult [email or web address] for contact info under    Signed, Abigail Butts, PA-C  12/02/2020 5:30 PM

## 2020-12-02 NOTE — Progress Notes (Signed)
New pt transfer from , [facility]. IV drip was not bought with pt, called [facility] to request stat.

## 2020-12-02 NOTE — Hospital Course (Signed)
Subjective: 2 Days Post-Op Procedure(s) (LRB): TOTAL KNEE ARTHROPLASTY (Left) Patient reports pain as moderate and severe.    Patient has complaints of L knee pain. Yesterday issues with dizziness and hypotension. Was feeling better this AM and BP improved, no other c/o. Since then had another episode of hypotension, dizziness, tachycardia. EKG with AFib. Hospitalist and cardiology involved, appreciate recommendations.  Objective: Vital signs in last 24 hours: Temp:  [98.4 F (36.9 C)-99.4 F (37.4 C)] 99 F (37.2 C) (09/30 1347) Pulse Rate:  [65-134] 130 (09/30 1347) Resp:  [16-20] 18 (09/30 1347) BP: (80-148)/(54-73) 111/56 (09/30 1347) SpO2:  [94 %-98 %] 98 % (09/30 1347)  Intake/Output from previous day:  Intake/Output Summary (Last 24 hours) at 12/02/2020 1432 Last data filed at 12/02/2020 1145 Gross per 24 hour  Intake 959.97 ml  Output 1990 ml  Net -1030.03 ml    Intake/Output this shift: Total I/O In: -  Out: 800 [Urine:800]  Labs: Results for orders placed or performed during the hospital encounter of 11/30/20  CBC  Result Value Ref Range   WBC 12.9 (H) 4.0 - 10.5 K/uL   RBC 3.53 (L) 4.22 - 5.81 MIL/uL   Hemoglobin 10.7 (L) 13.0 - 17.0 g/dL   HCT 31.7 (L) 39.0 - 52.0 %   MCV 89.8 80.0 - 100.0 fL   MCH 30.3 26.0 - 34.0 pg   MCHC 33.8 30.0 - 36.0 g/dL   RDW 13.4 11.5 - 15.5 %   Platelets 173 150 - 400 K/uL   nRBC 0.0 0.0 - 0.2 %  Basic metabolic panel  Result Value Ref Range   Sodium 136 135 - 145 mmol/L   Potassium 4.3 3.5 - 5.1 mmol/L   Chloride 106 98 - 111 mmol/L   CO2 26 22 - 32 mmol/L   Glucose, Bld 164 (H) 70 - 99 mg/dL   BUN 16 8 - 23 mg/dL   Creatinine, Ser 0.86 0.61 - 1.24 mg/dL   Calcium 8.6 (L) 8.9 - 10.3 mg/dL   GFR, Estimated >60 >60 mL/min   Anion gap 4 (L) 5 - 15  CBC  Result Value Ref Range   WBC 10.9 (H) 4.0 - 10.5 K/uL   RBC 3.57 (L) 4.22 - 5.81 MIL/uL   Hemoglobin 10.7 (L) 13.0 - 17.0 g/dL   HCT 32.0 (L) 39.0 - 52.0 %   MCV  89.6 80.0 - 100.0 fL   MCH 30.0 26.0 - 34.0 pg   MCHC 33.4 30.0 - 36.0 g/dL   RDW 13.6 11.5 - 15.5 %   Platelets 153 150 - 400 K/uL   nRBC 0.0 0.0 - 0.2 %  Basic metabolic panel  Result Value Ref Range   Sodium 137 135 - 145 mmol/L   Potassium 4.1 3.5 - 5.1 mmol/L   Chloride 106 98 - 111 mmol/L   CO2 24 22 - 32 mmol/L   Glucose, Bld 106 (H) 70 - 99 mg/dL   BUN 14 8 - 23 mg/dL   Creatinine, Ser 0.77 0.61 - 1.24 mg/dL   Calcium 8.2 (L) 8.9 - 10.3 mg/dL   GFR, Estimated >60 >60 mL/min   Anion gap 7 5 - 15    Exam - Neurologically intact ABD soft Neurovascular intact Sensation intact distally Intact pulses distally Dorsiflexion/Plantar flexion intact Incision: dressing C/D/I and no drainage No cellulitis present Compartment soft No sign of DVT Dressing/Incision - clean, dry, no drainage Motor function intact - moving foot and toes well on exam.   Assessment/Plan: 2  Days Post-Op Procedure(s) (LRB): TOTAL KNEE ARTHROPLASTY (Left)  Advance diet Up with therapy D/C IV fluids Past Medical History:  Diagnosis Date   Age-related nuclear cataract, bilateral 06/29/2020   Allergic rhinitis    BPH (benign prostatic hyperplasia)    Chicken pox    Coronary artery disease    a. MI 2003 with CABG.   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    Osteoarthritis of knee 05/13/2017   Renal cell carcinoma ([place]) 02/04/2019   Partial nephrectomyKIDNEY, LEFT, PARTIAL NEPHRECTOMY:    Right ureteral stone    S/P ablation of atrial flutter    followed by dr Rayann Heman (EP cardiolgoist)  ablation 04-17-2004  and by dr Rayann Heman 02-10- 2011 was successful   S/P CABG x 5 08-05-2001   in [place] , [place]   LIMA to LAD,  seqVG to Diagonal,   SeqVG to OM,  seqVG to PDA and PLA   Seasonal allergies    Sinus bradycardia    Wears glasses     DVT Prophylaxis - ASA Protocol Weight-Bearing as tolerated to left leg Outpt PT scheduled for next week. Will need to wait for D/C until medically stable Seen by myself and Dr Tonita Cong today  Cecilie Kicks 12/02/2020, 2:32 PM

## 2020-12-02 NOTE — Progress Notes (Signed)
Echocardiogram 2D Echocardiogram has been performed.  Oneal Deputy Jeziah Kretschmer RDCS 12/02/2020, 3:39 PM

## 2020-12-02 NOTE — Consult Note (Addendum)
Medical Consultation  Donald [name] JOA:[id number] DOB: [birth year] DOA: 11/30/2020 PCP: Ma Hillock, DO   Requesting physician: Dr. Tonita Cong Date of consultation: 9.30.22 Reason for consultation: Hypotension  Impression/Recommendations Hypotension Pre-syncope A fib     - let's move him to tele     - he's had two episodes like this over the past 2 days     - BP was 80/60 earlier today     - BP is better now that he is sitting (110/60, HR 100); would normally lead to thinking this is orthostatic hypotension (he has home med of PRN midodrine for "dizziness"; however, his EKG shows a fib at a rate of 134 w/o st elevations     - he denies palpitations, but does endorse some fleeting left side chest pain     - let's have cardiology take a look at him     - check echo, trp  UPDATE: he's back in to the 130s, SBP 110's. Let's get a dilt gtt going. He's moving to progressive.  CAD s/p CABG     - continue home regimen  Left knee DJD s/p TKR     - per primary team  [facility] will follow-up again tomorrow. Please contact me if I can be of assistance in the meanwhile. Thank you for this consultation.  Chief Complaint: knee pain  HPI:  Donald [name] is a 75 y.o. male with medical history significant of CAD s/p CABG, HLD, OA. Presenting w/ knee pain. Follows w/ [facility] for this condition. It was recommended that he have a total knee replacement after failing conservative management. He successfully completed a TKR 9/28. The patient has been working with physical therapy. He had an episode of dizziness yesterday. He was found to be hypotensive. He was given fluids and seemed to improve. Donald signs of infection. He had another episode of hypotension today while working with PT. He was given fluids but remained tachycardic. Ortho consulted [facility] for evaluation.   Review of Systems:  Denies dyspnea, N/V, fevers, chills, palpitations, abdominal pain. Reports lightheadedness, dizziness, chest pain.  Remainder of ROS is negative for all not mentioned in HPI.   Past Medical History:  Diagnosis Date   Age-related nuclear cataract, bilateral 06/29/2020   Allergic rhinitis    BPH (benign prostatic hyperplasia)    Chicken pox    Coronary artery disease    a. MI 2003 with CABG.   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    Osteoarthritis of knee 05/13/2017   Renal cell carcinoma ([place]) 02/04/2019   Partial nephrectomyKIDNEY, LEFT, PARTIAL NEPHRECTOMY:    Right ureteral stone    S/P ablation of atrial flutter    followed by dr Rayann Heman ([facility] cardiolgoist)  ablation 04-17-2004  and by dr allred 02-10- 2011 was successful   S/P CABG x 5 08-05-2001   in [place] , [place]   LIMA to LAD,  seqVG to Diagonal,  SeqVG to OM,  seqVG to PDA and PLA   Seasonal allergies    Sinus bradycardia    Wears glasses    Past Surgical History:  Procedure Laterality Date   CARDIAC CATHETERIZATION  08-04-2001  in [place], [place]   severe 3 vessel cad   [place]  x2  04-17-2004 [place], [place]);  04-14-2009 by dr allred   per dr allred report-- successful ablation clockwise isthmus-dependent right atrial flutter along the usual cavotricupid isthmus;  complete bidirectionl isthmus block achieved   CORONARY ARTERY BYPASS GRAFT  08-06-2003  in [place], [place]   x5-- LIMA - LAD,  seqVG to Diagonal, OM, PDA, and PLA   CYSTOSCOPY WITH RETROGRADE PYELOGRAM, URETEROSCOPY AND STENT PLACEMENT Right 08/10/2016   Procedure: CYSTOSCOPY WITH RETROGRADE PYELOGRAM, URETEROSCOPY AND STENT PLACEMENT;  Surgeon: Alexis Frock, MD;  Location: [facility];  Service: Urology;  Laterality: Right;   FIXATION RIGHT WRIST Right 2011   stability  for arthritis   INGUINAL HERNIA REPAIR Right 08-08-2015   [facility] health care at [facility] Left 02/04/2019   Procedure: XI ROBOTIC ASSITED LEFT PARTIAL NEPHRECTOMY; LEFT PARTIAL ADRENALECTOMY;  Surgeon: Alexis Frock, MD;  Location: [facility];  Service: Urology;  Laterality: Left;  3 HRS   ROTATOR CUFF REPAIR Right 2004;  2007   WISDOM TOOTH EXTRACTION     Social History:  reports that he has never smoked. His smokeless tobacco use includes snuff. He reports current alcohol use. He reports that he does not use drugs.  Donald Known Allergies Family History  Problem Relation Age of Onset   Heart attack Mother    Alzheimer's disease Mother    Cancer Father        throat cancer, chewing tobacco   Hypertension Neg Hx     Prior to Admission medications   Medication Sig Start Date End Date Taking? Authorizing Provider  aspirin 81 MG chewable tablet Chew 81 mg by mouth at bedtime.   Yes [provider]  aspirin EC 81 MG tablet Take 1 tablet (81 mg total) by mouth 2 (two) times daily after a meal. Day after surgery 11/30/20  Yes Susa Day, MD  atorvastatin (LIPITOR) 40 MG tablet Take 40 mg by mouth at bedtime.    Yes [provider]  docusate sodium (COLACE) 100 MG capsule Take 1 capsule (100 mg total) by mouth 2 (two) times daily as needed for mild constipation. 11/30/20  Yes Susa Day, MD  ibuprofen (ADVIL) 800 MG tablet Take 800 mg by mouth every 8 (eight) hours as needed for mild pain or moderate pain. 09/15/20  Yes [provider]  Multiple Vitamin (MULTIVITAMIN ADULT PO) Take 1 tablet by mouth daily.   Yes [provider]  Omega-3 Fatty Acids (FISH OIL) 1000 MG CAPS Take 1,000 mg by mouth daily.   Yes [provider]  oxyCODONE (OXY IR/ROXICODONE) 5 MG immediate release tablet Take 1-2 tablets (5-10 mg total) by mouth every 4 (four) hours as needed for severe pain. 11/30/20  Yes Susa Day, MD   polyethylene glycol (MIRALAX / GLYCOLAX) 17 g packet Take 17 g by mouth daily. 11/30/20  Yes Susa Day, MD  Red Yeast Rice 600 MG CAPS Take 600 mg by mouth daily.   Yes [provider]  midodrine (PROAMATINE) 2.5 MG tablet Take 1 tablet (2.5 mg total) by mouth 3 (three) times daily with meals as needed. Patient not taking: Donald sig reported 01/14/20   Jettie Booze, MD   Physical Exam: Blood pressure 110/60, pulse (!) 110, temperature 98.4 F (36.9 C), temperature source Oral, resp. rate 16, height 5\' 5"  (1.651 m), weight 79.5 kg, SpO2 94 %.  Vitals:   12/02/20 1127 12/02/20 1200  BP: (!) 80/60 110/60  Pulse: (!) 134 (!) 110  Resp: 16 16  Temp:    SpO2:      General: 75 y.o. male resting in chair in NAD Eyes: PERRL, normal sclera ENMT: Nares patent w/o discharge, orophaynx clear, dentition normal, ears w/o discharge/lesions/ulcers Neck: Supple, trachea midline Cardiovascular: tachy irregular, +S1, S2, Donald m/g/r, equal pulses throughout Respiratory: CTABL, Donald w/r/r, normal WOB GI: BS+, NDNT, Donald masses noted, Donald organomegaly noted MSK: Donald e/c/c Skin: Donald rashes, bruises, ulcerations noted Neuro: A&O x 3, Donald focal deficits Psyc: Appropriate interaction and affect, calm/cooperative  Labs on Admission:  Basic Metabolic Panel: Recent Labs  Lab 12/01/20 0322 12/02/20 1159  NA 136 137  K 4.3 4.1  CL 106 106  CO2 26 24  GLUCOSE 164* 106*  BUN 16 14  CREATININE 0.86 0.77  CALCIUM 8.6* 8.2*   Liver Function Tests: Donald results for input(s): AST, ALT, ALKPHOS, BILITOT, PROT, ALBUMIN in the last 168 hours. Donald results for input(s): LIPASE, AMYLASE in the last 168 hours. Donald results for input(s): AMMONIA in the last 168 hours. CBC: Recent Labs  Lab 12/01/20 0322 12/02/20 0324  WBC 12.9* 10.9*  HGB 10.7* 10.7*  HCT 31.7* 32.0*  MCV 89.8 89.6  PLT 173 153   Cardiac Enzymes: Donald results for input(s): CKTOTAL, CKMB, CKMBINDEX, TROPONINI in the last 168  hours. BNP: Invalid input(s): POCBNP CBG: Donald results for input(s): GLUCAP in the last 168 hours.  Radiological Exams on Admission: Donald results found.  EKG: Independently reviewed. A fib; rate 134; Donald st elevations  Time spent: 45 minutes  Elle Vezina A Alyza Artiaga DO [facility]  If 7PM-7AM, please contact night-coverage [email or web address] 12/02/2020, 1:15 PM

## 2020-12-02 NOTE — Progress Notes (Signed)
Physical Therapy Treatment Patient Details Name: Donald [name] MRN: [id number] DOB: 07-12-1945 Today's Date: 12/02/2020   History of Present Illness Pt is a 75 y.o. male s/p L TKA on 11/30/20. PMH significant for CAD s/p CABG x5 , MI, OA,  RTCR (2004; 2007).    PT Comments    POD # 2 am session Pt was OOB in recliner and stated he was dizzy when getting up the the nurse.  Vitals taken prior to activity shows irregular HR from 82 - 144 and BP 104/68.  RN called to room to verify and take manual BP.  We assisted pt to standing and BP dropped to 80/60 with increased dizziness/inability to stand.  Quickly assisted to recliner and placed in recovery supine position.   Pt still on IV fluids and received Bolus yesterday. Pt also took 10 mg Oxy earlier.  No c/o chest pain or dyspnea, just dizziness Assisted with standing just long enough to obtain a standing BP.  Positioned back to recliner.  In room extended time to monitor.    Recommendations for follow up therapy are one component of a multi-disciplinary discharge planning process, led by the attending physician.  Recommendations may be updated based on patient status, additional functional criteria and insurance authorization.  Follow Up Recommendations  Follow surgeon's recommendation for DC plan and follow-up therapies;Home health PT     Equipment Recommendations  None recommended by PT    Recommendations for Other Services       Precautions / Restrictions Precautions Precautions: Fall Precaution Comments: instructed no pillow under knee Restrictions Weight Bearing Restrictions: No LLE Weight Bearing: Weight bearing as tolerated     Mobility  Bed Mobility               General bed mobility comments: OOB in recliner    Transfers Overall transfer level: Needs assistance Equipment used: Rolling walker (2 wheeled) Transfers: Sit to/from Stand Sit to Stand: Min assist;+2 safety/equipment         General transfer  comment: 50% VC's on proper hand placement and LE extension as well as safety with turns.  stood just long enough to abtain a BP with RN.  Ambulation/Gait             General Gait Details: did not amb this session due to hypotension and irregular HR.   Stairs             Wheelchair Mobility    Modified Rankin (Stroke Patients Only)       Balance                                            Cognition Arousal/Alertness: Awake/alert Behavior During Therapy: WFL for tasks assessed/performed Overall Cognitive Status: Within Functional Limits for tasks assessed                                 General Comments: AxO x 3 very pleasant      Exercises      General Comments        Pertinent Vitals/Pain Pain Assessment: 0-10 Pain Score: 3  Pain Location: Rt knee Pain Descriptors / Indicators: Aching Pain Intervention(s): Monitored during session;Premedicated before session;Repositioned;Ice applied    Home Living  Prior Function            PT Goals (current goals can now be found in the care plan section) Progress towards PT goals: Progressing toward goals    Frequency    7X/week      PT Plan Current plan remains appropriate    Co-evaluation              AM-PAC PT "6 Clicks" Mobility   Outcome Measure  Help needed turning from your back to your side while in a flat bed without using bedrails?: A Little Help needed moving from lying on your back to sitting on the side of a flat bed without using bedrails?: A Little Help needed moving to and from a bed to a chair (including a wheelchair)?: A Little Help needed standing up from a chair using your arms (e.g., wheelchair or bedside chair)?: A Little Help needed to walk in hospital room?: A Little Help needed climbing 3-5 steps with a railing? : A Lot 6 Click Score: 17    End of Session Equipment Utilized During Treatment: Gait  belt Activity Tolerance: Other (comment) (A Fib and Hypotensive) Patient left: in chair;with call bell/phone within reach Nurse Communication: Mobility status PT Visit Diagnosis: Unsteadiness on feet (R26.81);Muscle weakness (generalized) (M62.81);Pain Pain - Right/Left: Left Pain - part of body: Knee     Time: 6063-0160 PT Time Calculation (min) (ACUTE ONLY): 40 min  Charges:  $Therapeutic Activity: 38-52 mins                     Rica Koyanagi  PTA Acute  Rehabilitation Services Pager      [phone] Office      [phone]

## 2020-12-02 NOTE — Plan of Care (Signed)
  Problem: Pain Managment: Goal: General experience of comfort will improve Outcome: Progressing   Problem: Activity: Goal: Ability to avoid complications of mobility impairment will improve Outcome: Progressing   Problem: Activity: Goal: Risk for activity intolerance will decrease Outcome: Progressing

## 2020-12-02 NOTE — Progress Notes (Signed)
PHYSICAL THERAPY  Pt was OOB in recliner and stated he was dizzy when getting up the the nurse.  Vitals taken prior to activity shows irregular HR from 82 - 144 and BP 104/68.  RN called to room to verify and take manual BP.  We assisted pt to standing and BP dropped to 80/60 with increased dizziness/inability to stand.  Quickly assisted to recliner and placed in recovery supine position.   Pt still on IV fluids and received Bolus yesterday. Pt also took 10 mg Oxy earlier.  Pt looking like he will not meet goals to D/C to home today.  Will attempt to see again after lunch.  Rica Koyanagi  PTA Acute  Rehabilitation Services Pager      [phone] Office      [phone]

## 2020-12-02 NOTE — Progress Notes (Signed)
Paged Cherylann Ratel MD for patients Troponin level of 117.

## 2020-12-02 NOTE — Progress Notes (Signed)
PT Cancellation Note  Patient Details Name: ADHVIK CANADY MRN: [id number] DOB: Jul 11, 1945   Cancelled Treatment:     will hold off PT this afternoon due to Cardiac workup.  Resume Therapy in am   Rica Koyanagi  PTA Acute  Rehabilitation Services Pager      [phone] Office      [phone]

## 2020-12-02 NOTE — Progress Notes (Signed)
Pt was feeling dizzi while PT was trying to get him up from the chair. Checked his BP 80/60, P 134 sitting, checked manually BP 105/60, P 110 irregular. Called and talked to [employer], PA and Beane Jeffrey,MD. As per order, NS 250 bolus given, STAT EKG done, STAT Metabolic panel done. Rechecked Vitals at 1200. BP 110/60, P 110. EKG report called to Cherylann Ratel MD. Cardiology consult done and recommended to transfer pt to Progressive Care Unit.

## 2020-12-02 NOTE — Progress Notes (Signed)
Paged Cherylann Ratel MD for patients Troponin level of 180

## 2020-12-03 ENCOUNTER — Encounter (HOSPITAL_COMMUNITY): Payer: Self-pay | Admitting: Specialist

## 2020-12-03 DIAGNOSIS — I480 Paroxysmal atrial fibrillation: Secondary | ICD-10-CM | POA: Diagnosis not present

## 2020-12-03 LAB — CBC
HCT: 31.3 % — ABNORMAL LOW (ref 39.0–52.0)
Hemoglobin: 10.4 g/dL — ABNORMAL LOW (ref 13.0–17.0)
MCH: 30.3 pg (ref 26.0–34.0)
MCHC: 33.2 g/dL (ref 30.0–36.0)
MCV: 91.3 fL (ref 80.0–100.0)
Platelets: 166 10*3/uL (ref 150–400)
RBC: 3.43 MIL/uL — ABNORMAL LOW (ref 4.22–5.81)
RDW: 13.9 % (ref 11.5–15.5)
WBC: 10.7 10*3/uL — ABNORMAL HIGH (ref 4.0–10.5)
nRBC: 0 % (ref 0.0–0.2)

## 2020-12-03 MED ORDER — DIGOXIN 0.25 MG/ML IJ SOLN
0.2500 mg | Freq: Once | INTRAMUSCULAR | Status: AC
  Administered 2020-12-03: 0.25 mg via INTRAVENOUS
  Filled 2020-12-03: qty 2

## 2020-12-03 MED ORDER — DILTIAZEM HCL ER COATED BEADS 120 MG PO CP24
120.0000 mg | ORAL_CAPSULE | Freq: Every day | ORAL | Status: DC
  Administered 2020-12-03: 120 mg via ORAL
  Filled 2020-12-03: qty 1

## 2020-12-03 MED ORDER — DILTIAZEM HCL 30 MG PO TABS
30.0000 mg | ORAL_TABLET | Freq: Once | ORAL | Status: AC
  Administered 2020-12-03: 30 mg via ORAL
  Filled 2020-12-03: qty 1

## 2020-12-03 NOTE — Progress Notes (Signed)
Cardizem 30 mg given at 1430. HR continues to be 125-130's sustained AFIB. Hosptialist provider contacted. See orders

## 2020-12-03 NOTE — Progress Notes (Signed)
PROGRESS NOTE  Donald [name] WNI:[id number] DOB: [birth year] DOA: 11/30/2020 PCP: Howard Pouch A, DO   LOS: 1 day   Brief Narrative / Interim history: 75 year old male with history of CAD status post CABG, hyperlipidemia, comes in for  elective knee replacement, and underwent TKR 9/28.  Had an episode of dizziness, hypotension on 9/30, was found to be in A. fib with RVR and the hospitalist team was consulted.  Subjective / 24h Interval events: Doing well, denies any chest pain, denies any palpitations.  The knee is bothering him  Assessment & Plan: Principal Problem Hypotension, presyncope due to A. fib with RVR-he was placed on diltiazem drip, converted to sinus rhythm this morning.  Cardiology was also consulted.  2D echo was fairly unremarkable.  He will be converted to oral diltiazem, was also placed on Eliquis.  Stable for discharge from medicine standpoint  Active Problems Coronary artery disease with history of CABG-continue home medications  If he is still here tomorrow I will continue to follow  Scheduled Meds:  acidophilus  1 capsule Oral Daily   apixaban  5 mg Oral BID   atorvastatin  40 mg Oral QHS   diltiazem  120 mg Oral Daily   docusate sodium  100 mg Oral BID   multivitamin with minerals   Oral Daily   Continuous Infusions:  sodium chloride 50 mL/hr at 12/02/20 2140   methocarbamol (ROBAXIN) IV 500 mg (11/30/20 1033)   PRN Meds:.acetaminophen, alum & mag hydroxide-simeth, bisacodyl, diphenhydrAMINE, HYDROmorphone (DILAUDID) injection, menthol-cetylpyridinium **OR** phenol, methocarbamol **OR** methocarbamol (ROBAXIN) IV, metoCLOPramide **OR** metoCLOPramide (REGLAN) injection, midodrine, ondansetron **OR** ondansetron (ZOFRAN) IV, oxyCODONE, oxyCODONE, polyethylene glycol  Diet Orders (From admission, onward)     Start     Ordered   11/30/20 1419  Diet regular Room service appropriate? Yes; Fluid consistency: Thin  Diet effective now       Question Answer Comment   Room service appropriate? Yes   Fluid consistency: Thin      11/30/20 1418            DVT prophylaxis: SCDs Start: 11/30/20 1419 Place TED hose Start: 11/30/20 1419 apixaban (ELIQUIS) tablet 5 mg     Code Status: Full Code  Family Communication: No family at bedside  Level of care: Telemetry   Procedures:  2D echo  Microbiology  none  Antimicrobials: none    Objective: Vitals:   12/02/20 1713 12/02/20 2139 12/02/20 2348 12/03/20 0436  BP: 108/67 (!) 109/52 (!) 116/58 (!) 112/55  Pulse: (!) 118 76 71 73  Resp: (!) 21 19 19 16   Temp: 98.6 F (37 C) 100 F (37.8 C) 99.5 F (37.5 C) 98.2 F (36.8 C)  TempSrc: Oral Oral Oral Oral  SpO2:  99% 95% 96%  Weight:      Height:        Intake/Output Summary (Last 24 hours) at 12/03/2020 1053 Last data filed at 12/03/2020 1000 Gross per 24 hour  Intake 1451.52 ml  Output 1175 ml  Net 276.52 ml   Filed Weights   11/30/20 1449  Weight: 79.5 kg    Examination:  Constitutional: NAD Eyes: no scleral icterus ENMT: Mucous membranes are moist.  Neck: normal, supple Respiratory: clear to auscultation bilaterally, no wheezing, no crackles. Normal respiratory effort.  Cardiovascular: Regular rate and rhythm, no murmurs / rubs / gallops. No LE edema.  Abdomen: non distended, no tenderness. Bowel sounds positive.  Musculoskeletal: no clubbing / cyanosis.  Skin: no rashes Neurologic: No focal deficits  Data Reviewed: I have independently reviewed following labs and imaging studies  CBC: Recent Labs  Lab 12/01/20 0322 12/02/20 0324 12/03/20 0425  WBC 12.9* 10.9* 10.7*  HGB 10.7* 10.7* 10.4*  HCT 31.7* 32.0* 31.3*  MCV 89.8 89.6 91.3  PLT 173 153 332   Basic Metabolic Panel: Recent Labs  Lab 12/01/20 0322 12/02/20 1159  NA 136 137  K 4.3 4.1  CL 106 106  CO2 26 24  GLUCOSE 164* 106*  BUN 16 14  CREATININE 0.86 0.77  CALCIUM 8.6* 8.2*   Liver Function Tests: No results for input(s): AST, ALT,  ALKPHOS, BILITOT, PROT, ALBUMIN in the last 168 hours. Coagulation Profile: No results for input(s): INR, PROTIME in the last 168 hours. HbA1C: No results for input(s): HGBA1C in the last 72 hours. CBG: No results for input(s): GLUCAP in the last 168 hours.  Recent Results (from the past 240 hour(s))  SARS Coronavirus 2 (TAT 6-24 hrs)     Status: None   Collection Time: 11/28/20 12:00 AM  Result Value Ref Range Status   SARS Coronavirus 2 RESULT: NEGATIVE  Final    Comment: RESULT: NEGATIVESARS-CoV-2 INTERPRETATION:A NEGATIVE  test result means that SARS-CoV-2 RNA was not present in the specimen above the limit of detection of this test. This does not preclude a possible SARS-CoV-2 infection and should not be used as the  sole basis for patient management decisions. Negative results must be combined with clinical observations, patient history, and epidemiological information. Optimum specimen types and timing for peak viral levels during infections caused by SARS-CoV-2  have not been determined. Collection of multiple specimens or types of specimens may be necessary to detect virus. Improper specimen collection and handling, sequence variability under primers/probes, or organism present below the limit of detection may  lead to false negative results. Positive and negative predictive values of testing are highly dependent on prevalence. False negative test results are more likely when prevalence of disease is high.The expected result is NEGATIVE.Fact S heet for  Healthcare Providers: [email or web address].no Sheet for Patients: SalonLookup.es Reference Range - Negative      Radiology Studies: ECHOCARDIOGRAM LIMITED  Result Date: 12/02/2020    ECHOCARDIOGRAM REPORT   Patient Name:   CHAZ MCGLASSON Date of Exam: 12/02/2020 Medical Rec #:  [id number]     Height:       65.0 in Accession #:    [id number]    Weight:       175.3 lb Date of Birth:   Jul 18, 1945      BSA:          1.870 m Patient Age:    35 years      BP:           111/56 mmHg Patient Gender: M             HR:           130 bpm. Exam Location:  Inpatient Procedure: Limited Echo and Color Doppler Indications:    I48.91* Unspecified atrial fibrillation  History:        Patient has prior history of Echocardiogram examinations, most                 recent 01/01/2020. Prior CABG, Arrythmias:Atrial Fibrillation;                 Risk Factors:Dyslipidemia.  Sonographer:    Raquel Sarna Senior RDCS Referring Phys: [id number] [place]  1. Left ventricular ejection fraction, by estimation, is 70  to 75%. The left ventricle has hyperdynamic function. The left ventricle has no regional wall motion abnormalities. Left ventricular diastolic function could not be evaluated.  2. Right ventricular systolic function is normal. The right ventricular size is normal.  3. The mitral valve is grossly normal. No evidence of mitral valve regurgitation.  4. The aortic valve is tricuspid. Aortic valve regurgitation is not visualized. No aortic stenosis is present.  5. The inferior vena cava is normal in size with <50% respiratory variability, suggesting right atrial pressure of 8 mmHg. Comparison(s): Changes from prior study are noted. 01/01/2020: LVEF 55-60%, normal atrial size. FINDINGS  Left Ventricle: Left ventricular ejection fraction, by estimation, is 70 to 75%. The left ventricle has hyperdynamic function. The left ventricle has no regional wall motion abnormalities. The left ventricular internal cavity size was normal in size. There is borderline left ventricular hypertrophy. Left ventricular diastolic function could not be evaluated due to atrial fibrillation. Left ventricular diastolic function could not be evaluated. Right Ventricle: The right ventricular size is normal. No increase in right ventricular wall thickness. Right ventricular systolic function is normal. Left Atrium: Left atrial size was normal  in size. Right Atrium: Right atrial size was normal in size. Pericardium: There is no evidence of pericardial effusion. Mitral Valve: The mitral valve is grossly normal. No evidence of mitral valve regurgitation. Tricuspid Valve: The tricuspid valve is grossly normal. Tricuspid valve regurgitation is trivial. Aortic Valve: The aortic valve is tricuspid. Aortic valve regurgitation is not visualized. No aortic stenosis is present. Pulmonic Valve: The pulmonic valve was grossly normal. Pulmonic valve regurgitation is not visualized. Aorta: The aortic root and ascending aorta are structurally normal, with no evidence of dilitation. Venous: The inferior vena cava is normal in size with less than 50% respiratory variability, suggesting right atrial pressure of 8 mmHg. IAS/Shunts: No atrial level shunt detected by color flow Doppler. EKG: Rhythm strip during this exam demostrated AFib wtih RVR. Lyman Bishop MD Electronically signed by Lyman Bishop MD Signature Date/Time: 12/02/2020/3:56:58 PM    Final     Marzetta Board, MD, PhD [facility]  Between 7 am - 7 pm I am available, please contact me via Amion (for emergencies) or Securechat (non urgent messages)  Between 7 pm - 7 am I am not available, please contact night coverage MD/APP via Amion

## 2020-12-03 NOTE — Progress Notes (Signed)
Patients HR is sustaining AFIB RVR 135-145. BP 97/70. Hospitalist Provider contacted. See orders. Will continue to monitor.

## 2020-12-03 NOTE — Progress Notes (Signed)
DAILY PROGRESS NOTE   Patient Name: Donald [name] Date of Encounter: 12/03/2020 Cardiologist: Larae Grooms, MD  Chief Complaint   No complaints  Patient Profile   Donald [name] is a 75 y.o. male with a with a PMH of CAD s/p CABG in 2001, atrial flutter s/p ablation in 2011 no longer on anticoagulation, hypertension, hyperlipidemia, renal cancer s/p left nephrectomy 02/2019, who is being seen 12/02/2020 for the evaluation of atrial fibrillation at the request of Dr. Marylyn Ishihara.  Subjective   Converted to sinus rhythm overnight. Seen by surgery today POD #3, plan d/c home today or tomorrow.  Objective   Vitals:   12/02/20 1713 12/02/20 2139 12/02/20 2348 12/03/20 0436  BP: 108/67 (!) 109/52 (!) 116/58 (!) 112/55  Pulse: (!) 118 76 71 73  Resp: (!) 21 19 19 16   Temp: 98.6 F (37 C) 100 F (37.8 C) 99.5 F (37.5 C) 98.2 F (36.8 C)  TempSrc: Oral Oral Oral Oral  SpO2:  99% 95% 96%  Weight:      Height:        Intake/Output Summary (Last 24 hours) at 12/03/2020 0354 Last data filed at 12/03/2020 0530 Gross per 24 hour  Intake 1451.52 ml  Output 1575 ml  Net -123.48 ml   Filed Weights   11/30/20 1449  Weight: 79.5 kg    Physical Exam   General appearance: alert and no distress Neck: no carotid bruit, no JVD, and thyroid not enlarged, symmetric, no tenderness/mass/nodules Lungs: clear to auscultation bilaterally Heart: regular rate and rhythm, S1, S2 normal, no murmur, click, rub or gallop Abdomen: soft, non-tender; bowel sounds normal; no masses,  no organomegaly Extremities: extremities normal, atraumatic, no cyanosis or edema Pulses: 2+ and symmetric Skin: Skin color, texture, turgor normal. No rashes or lesions Neurologic: Grossly normal Psych: Pleasant  Inpatient Medications    Scheduled Meds:  acidophilus  1 capsule Oral Daily   apixaban  5 mg Oral BID   atorvastatin  40 mg Oral QHS   docusate sodium  100 mg Oral BID   multivitamin with minerals    Oral Daily    Continuous Infusions:  sodium chloride 50 mL/hr at 12/02/20 2140   diltiazem (CARDIZEM) infusion 5 mg/hr (12/02/20 1732)   methocarbamol (ROBAXIN) IV 500 mg (11/30/20 1033)    PRN Meds: acetaminophen, alum & mag hydroxide-simeth, bisacodyl, diphenhydrAMINE, HYDROmorphone (DILAUDID) injection, menthol-cetylpyridinium **OR** phenol, methocarbamol **OR** methocarbamol (ROBAXIN) IV, metoCLOPramide **OR** metoCLOPramide (REGLAN) injection, midodrine, ondansetron **OR** ondansetron (ZOFRAN) IV, oxyCODONE, oxyCODONE, polyethylene glycol   Labs   Results for orders placed or performed during the hospital encounter of 11/30/20 (from the past 48 hour(s))  CBC     Status: Abnormal   Collection Time: 12/02/20  3:24 AM  Result Value Ref Range   WBC 10.9 (H) 4.0 - 10.5 K/uL   RBC 3.57 (L) 4.22 - 5.81 MIL/uL   Hemoglobin 10.7 (L) 13.0 - 17.0 g/dL   HCT 32.0 (L) 39.0 - 52.0 %   MCV 89.6 80.0 - 100.0 fL   MCH 30.0 26.0 - 34.0 pg   MCHC 33.4 30.0 - 36.0 g/dL   RDW 13.6 11.5 - 15.5 %   Platelets 153 150 - 400 K/uL   nRBC 0.0 0.0 - 0.2 %    Comment: Performed at [facility], [place] [address], [place], [place] [address]  Basic metabolic panel     Status: Abnormal   Collection Time: 12/02/20 11:59 AM  Result Value Ref Range  Sodium 137 135 - 145 mmol/L   Potassium 4.1 3.5 - 5.1 mmol/L   Chloride 106 98 - 111 mmol/L   CO2 24 22 - 32 mmol/L   Glucose, Bld 106 (H) 70 - 99 mg/dL    Comment: Glucose reference range applies only to samples taken after fasting for at least 8 hours.   BUN 14 8 - 23 mg/dL   Creatinine, Ser 0.77 0.61 - 1.24 mg/dL   Calcium 8.2 (L) 8.9 - 10.3 mg/dL   GFR, Estimated >60 >60 mL/min    Comment: (NOTE) Calculated using the CKD-EPI Creatinine Equation (2021)    Anion gap 7 5 - 15    Comment: Performed at [facility], [place] [address], [place], [place] [address]  Troponin I (High Sensitivity)     Status: Abnormal    Collection Time: 12/02/20  1:39 PM  Result Value Ref Range   Troponin I (High Sensitivity) 117 (HH) <18 ng/L    Comment: CRITICAL RESULT CALLED TO, READ BACK BY AND VERIFIED WITH: BHANDARI,K. RN @1453  ON 12/02/2020 BY COHEN,K (NOTE) Elevated high sensitivity troponin I (hsTnI) values and significant  changes across serial measurements may suggest ACS but many other  chronic and acute conditions are known to elevate hsTnI results.  Refer to the Links section for chest pain algorithms and additional  guidance. Performed at [facility], [place] [address], [place], [place] [address]   Troponin I (High Sensitivity)     Status: Abnormal   Collection Time: 12/02/20  3:27 PM  Result Value Ref Range   Troponin I (High Sensitivity) 180 (HH) <18 ng/L    Comment: DELTA CHECK NOTED CRITICAL RESULT CALLED TO, READ BACK BY AND VERIFIED WITH: BHANDARI,N. RN @1621  ON 12/02/2020 BY COHEN,K (NOTE) Elevated high sensitivity troponin I (hsTnI) values and significant  changes across serial measurements may suggest ACS but many other  chronic and acute conditions are known to elevate hsTnI results.  Refer to the Links section for chest pain algorithms and additional  guidance. Performed at [facility], [place] [address], [place], [place] [address]   CBC     Status: Abnormal   Collection Time: 12/03/20  4:25 AM  Result Value Ref Range   WBC 10.7 (H) 4.0 - 10.5 K/uL   RBC 3.43 (L) 4.22 - 5.81 MIL/uL   Hemoglobin 10.4 (L) 13.0 - 17.0 g/dL   HCT 31.3 (L) 39.0 - 52.0 %   MCV 91.3 80.0 - 100.0 fL   MCH 30.3 26.0 - 34.0 pg   MCHC 33.2 30.0 - 36.0 g/dL   RDW 13.9 11.5 - 15.5 %   Platelets 166 150 - 400 K/uL   nRBC 0.0 0.0 - 0.2 %    Comment: Performed at [facility], [place] [address] [place], [place] [address]    ECG   N/A  Telemetry   Sinus rhythm in the 60's - Personally Reviewed  Radiology    ECHOCARDIOGRAM LIMITED  Result Date:  12/02/2020    ECHOCARDIOGRAM REPORT   Patient Name:   Donald [name] Date of Exam: 12/02/2020 Medical Rec #:  [id number]     Height:       65.0 in Accession #:    [id number]    Weight:       175.3 lb Date of Birth:  1945/09/23      BSA:          1.870 m Patient Age:    66 years  BP:           111/56 mmHg Patient Gender: M             HR:           130 bpm. Exam Location:  Inpatient Procedure: Limited Echo and Color Doppler Indications:    I48.91* Unspecified atrial fibrillation  History:        Patient has prior history of Echocardiogram examinations, most                 recent 01/01/2020. Prior CABG, Arrythmias:Atrial Fibrillation;                 Risk Factors:Dyslipidemia.  Sonographer:    Raquel Sarna Senior RDCS Referring Phys: [id number] [place]  1. Left ventricular ejection fraction, by estimation, is 70 to 75%. The left ventricle has hyperdynamic function. The left ventricle has no regional wall motion abnormalities. Left ventricular diastolic function could not be evaluated.  2. Right ventricular systolic function is normal. The right ventricular size is normal.  3. The mitral valve is grossly normal. No evidence of mitral valve regurgitation.  4. The aortic valve is tricuspid. Aortic valve regurgitation is not visualized. No aortic stenosis is present.  5. The inferior vena cava is normal in size with <50% respiratory variability, suggesting right atrial pressure of 8 mmHg. Comparison(s): Changes from prior study are noted. 01/01/2020: LVEF 55-60%, normal atrial size. FINDINGS  Left Ventricle: Left ventricular ejection fraction, by estimation, is 70 to 75%. The left ventricle has hyperdynamic function. The left ventricle has no regional wall motion abnormalities. The left ventricular internal cavity size was normal in size. There is borderline left ventricular hypertrophy. Left ventricular diastolic function could not be evaluated due to atrial fibrillation. Left ventricular diastolic function  could not be evaluated. Right Ventricle: The right ventricular size is normal. No increase in right ventricular wall thickness. Right ventricular systolic function is normal. Left Atrium: Left atrial size was normal in size. Right Atrium: Right atrial size was normal in size. Pericardium: There is no evidence of pericardial effusion. Mitral Valve: The mitral valve is grossly normal. No evidence of mitral valve regurgitation. Tricuspid Valve: The tricuspid valve is grossly normal. Tricuspid valve regurgitation is trivial. Aortic Valve: The aortic valve is tricuspid. Aortic valve regurgitation is not visualized. No aortic stenosis is present. Pulmonic Valve: The pulmonic valve was grossly normal. Pulmonic valve regurgitation is not visualized. Aorta: The aortic root and ascending aorta are structurally normal, with no evidence of dilitation. Venous: The inferior vena cava is normal in size with less than 50% respiratory variability, suggesting right atrial pressure of 8 mmHg. IAS/Shunts: No atrial level shunt detected by color flow Doppler. EKG: Rhythm strip during this exam demostrated AFib wtih RVR. Lyman Bishop MD Electronically signed by Lyman Bishop MD Signature Date/Time: 12/02/2020/3:56:58 PM    Final     Cardiac Studies   See echo above  Assessment   Active Problems:   S/P TKR (total knee replacement) using cement   PAF (paroxysmal atrial fibrillation) ([place])   Plan   Converted to sinus rhythm overnight - history of CABG and atrial flutter s/p ablation - agree with Eliquis 5 mg BID. Will d/c IV diltiazem, switch to cardizem CD 120 mg daily. Concern for tachy-brady in the past, will need close follow-up in afib clinic, possibly next week. Will try to arrange for follow-up.  Ok to d/c today from a cardiac standpoint. Will need an Rx for the diltiazem.  Time Spent Directly with Patient:  I have spent a total of 25 minutes with the patient reviewing hospital notes, telemetry, EKGs, labs and  examining the patient as well as establishing an assessment and plan that was discussed personally with the patient.  > 50% of time was spent in direct patient care.  Length of Stay:  LOS: 1 day   Pixie Casino, MD, [facility], [place] Director of the Advanced Lipid Disorders &  Cardiovascular Risk Reduction Clinic Diplomate of the American Board of Clinical Lipidology Attending Cardiologist  Direct Dial: [phone]  Fax: [phone]  Website:  [email or web address] [email or web address] Abrahim Sargent 12/03/2020, 9:21 AM

## 2020-12-03 NOTE — Progress Notes (Signed)
Msg sent to Afib clinic per Dr. Lysbeth Penner request to arrange f/u. Office will call pt with appt info. I listed this FYI on his AVS.

## 2020-12-03 NOTE — Progress Notes (Signed)
   Subjective: 3 Days Post-Op Procedure(s) (LRB): TOTAL KNEE ARTHROPLASTY (Left) Patient reports pain as moderate.   Plan is to go Home after hospital stay. Had afib with rapid rate and low BP yesterday necessitating cardizem drip and transfer to telemetry. Rate well controlled this AM with normal BP  Objective: Vital signs in last 24 hours: Temp:  [98.2 F (36.8 C)-100 F (37.8 C)] 98.2 F (36.8 C) (10/01 0436) Pulse Rate:  [71-134] 73 (10/01 0436) Resp:  [16-21] 16 (10/01 0436) BP: (80-116)/(52-67) 112/55 (10/01 0436) SpO2:  [95 %-99 %] 96 % (10/01 0436)  Intake/Output from previous day:  Intake/Output Summary (Last 24 hours) at 12/03/2020 0830 Last data filed at 12/02/2020 1759 Gross per 24 hour  Intake 616.71 ml  Output 1175 ml  Net -558.29 ml    Intake/Output this shift: No intake/output data recorded.  Labs: Recent Labs    12/01/20 0322 12/02/20 0324 12/03/20 0425  HGB 10.7* 10.7* 10.4*   Recent Labs    12/02/20 0324 12/03/20 0425  WBC 10.9* 10.7*  RBC 3.57* 3.43*  HCT 32.0* 31.3*  PLT 153 166   Recent Labs    12/01/20 0322 12/02/20 1159  NA 136 137  K 4.3 4.1  CL 106 106  CO2 26 24  BUN 16 14  CREATININE 0.86 0.77  GLUCOSE 164* 106*  CALCIUM 8.6* 8.2*   No results for input(s): LABPT, INR in the last 72 hours.  EXAM General - Patient is Alert, Appropriate, and Oriented Extremity - Neurologically intact Neurovascular intact Incision: dressing C/D/I No cellulitis present Compartment soft Dressing/Incision - clean, dry, no drainage Motor Function - intact, moving foot and toes well on exam.   Past Medical History:  Diagnosis Date   Age-related nuclear cataract, bilateral 06/29/2020   Allergic rhinitis    BPH (benign prostatic hyperplasia)    Chicken pox    Coronary artery disease    a. MI 2003 with CABG.   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    Osteoarthritis of knee 05/13/2017   Renal cell carcinoma ([place]) 02/04/2019   Partial nephrectomyKIDNEY, LEFT, PARTIAL NEPHRECTOMY:    Right ureteral stone    S/P ablation of atrial flutter    followed by dr Rayann Heman ([place] cardiolgoist)  ablation 04-17-2004  and by dr Rayann Heman 02-10- 2011 was successful   S/P CABG x 5 08-05-2001   in [place] , [place]   LIMA to LAD,  seqVG to Diagonal,  SeqVG to OM,  seqVG to PDA and PLA   Seasonal allergies    Sinus bradycardia    Wears glasses     Assessment/Plan: 3 Days Post-Op Procedure(s) (LRB): TOTAL KNEE ARTHROPLASTY (Left) Active Problems:   S/P TKR (total knee replacement) using cement   Up with therapy Afib management per hospitalist team- Appreciate their assistance Continue PT Possible discharge tomorrow if HD stable  Weight-Bearing as tolerated to left leg  Donald [name] 12/03/2020, 8:30 AM

## 2020-12-03 NOTE — Plan of Care (Signed)
  Problem: Pain Managment: Goal: General experience of comfort will improve Outcome: Completed/Met

## 2020-12-03 NOTE — Progress Notes (Addendum)
Physical Therapy Treatment Patient Details Name: Donald [name] MRN: [id number] DOB: 1945/09/24 Today's Date: 12/03/2020   History of Present Illness Pt is a 75 y.o. male s/p L TKA on 11/30/20. PMH significant for CAD s/p CABG x5 , MI, OA,  RTCR (2004; 2007).    PT Comments    Pt continues to be limited with therapy due to BP and dizziness complaints. Pt tolerates LE strengthening exercises in recliner. Pt powers to stand with increased time and cues, unable to tolerate static standing for 3 minutes to perform BP reading due to dizziness. Educated pt on sitting up in recliner with head upright to continue to acclimate to upright sitting, but pt requests RN lower pt into supine position in recliner while PT out to get ice; again educated pt and pt states he wants to nap then he will elevate head. Educated pt on performing additional quad sets and ankle pumps throughout the day; spouse also educated and all questions.  Supine in recliner: 121/53 Sitting: 123/51 Standing: 100/59 Standing 3 minutes: unable Return to sitting: 123/74    Recommendations for follow up therapy are one component of a multi-disciplinary discharge planning process, led by the attending physician.  Recommendations may be updated based on patient status, additional functional criteria and insurance authorization.  Follow Up Recommendations  Follow surgeon's recommendation for DC plan and follow-up therapies;Home health PT     Equipment Recommendations  None recommended by PT    Recommendations for Other Services       Precautions / Restrictions Precautions Precautions: Fall Restrictions Weight Bearing Restrictions: No LLE Weight Bearing: Weight bearing as tolerated     Mobility  Bed Mobility  General bed mobility comments: OOB in recliner    Transfers Overall transfer level: Needs assistance Equipment used: Rolling walker (2 wheeled) Transfers: Sit to/from Stand Sit to Stand: Min assist  General  transfer comment: VC for hand placement and LLE placement to power up and return to sitting, unable to tolerate standing full 3 minutes for for 2nd BP reading due to dizziness  Ambulation/Gait  General Gait Details: unable due to BP and dizziness   Stairs             Wheelchair Mobility    Modified Rankin (Stroke Patients Only)       Balance Overall balance assessment: Needs assistance  Standing balance support: During functional activity;Bilateral upper extremity supported Standing balance-Leahy Scale: Poor Standing balance comment: reliant on UE support     Cognition Arousal/Alertness: Awake/alert Behavior During Therapy: WFL for tasks assessed/performed Overall Cognitive Status: Impaired/Different from baseline  General Comments: spouse present states pt appears "spacey or distracted" compared to normal, able to follow instructions appropriately      Exercises Total Joint Exercises Ankle Circles/Pumps: Seated;AROM;Both;20 reps (LE extended on recliner) Quad Sets: Seated;AROM;Strengthening;Both;10 reps (LE extended on recliner) Heel Slides: Seated;AAROM;Left;10 reps (LE extended on recliner)    General Comments        Pertinent Vitals/Pain Pain Assessment: 0-10 Pain Score: 6  Pain Location: Rt knee Pain Descriptors / Indicators: Aching;Sore Pain Intervention(s): Limited activity within patient's tolerance;Monitored during session;Premedicated before session;Repositioned;Ice applied    Home Living                      Prior Function            PT Goals (current goals can now be found in the care plan section) Acute Rehab PT Goals Patient Stated Goal: go home tomorrow PT  Goal Formulation: With patient Time For Goal Achievement: 12/14/20 Potential to Achieve Goals: Good Progress towards PT goals: Progressing toward goals    Frequency    7X/week      PT Plan Current plan remains appropriate    Co-evaluation              AM-PAC  PT "6 Clicks" Mobility   Outcome Measure  Help needed turning from your back to your side while in a flat bed without using bedrails?: A Little Help needed moving from lying on your back to sitting on the side of a flat bed without using bedrails?: A Little Help needed moving to and from a bed to a chair (including a wheelchair)?: A Little Help needed standing up from a chair using your arms (e.g., wheelchair or bedside chair)?: A Little Help needed to walk in hospital room?: A Little Help needed climbing 3-5 steps with a railing? : A Lot 6 Click Score: 17    End of Session Equipment Utilized During Treatment: Gait belt Activity Tolerance: Other (comment) (hypotension) Patient left: in chair;with call bell/phone within reach;with family/visitor present Nurse Communication: Mobility status;Other (comment) (RN fixed IV, BP) PT Visit Diagnosis: Unsteadiness on feet (R26.81);Muscle weakness (generalized) (M62.81);Pain Pain - Right/Left: Left Pain - part of body: Knee     Time: 1020-1104 PT Time Calculation (min) (ACUTE ONLY): 44 min  Charges:  $Therapeutic Exercise: 8-22 mins $Therapeutic Activity: 23-37 mins                      Tori Kellan Boehlke PT, DPT 12/03/20, 12:36 PM

## 2020-12-04 DIAGNOSIS — I480 Paroxysmal atrial fibrillation: Secondary | ICD-10-CM | POA: Diagnosis not present

## 2020-12-04 MED ORDER — DILTIAZEM HCL ER COATED BEADS 180 MG PO CP24
180.0000 mg | ORAL_CAPSULE | Freq: Every day | ORAL | Status: DC
  Administered 2020-12-04 – 2020-12-07 (×3): 180 mg via ORAL
  Filled 2020-12-04 (×4): qty 1

## 2020-12-04 NOTE — Progress Notes (Signed)
PROGRESS NOTE  Donald [name] OYD:[id number] DOB: 01-05-1946 DOA: 11/30/2020 PCP: Howard Pouch A, DO   LOS: 2 days   Brief Narrative / Interim history: 75 year old male with history of CAD status post CABG, hyperlipidemia, comes in for  elective knee replacement, and underwent TKR 9/28.  Had an episode of dizziness, hypotension on 9/30, was found to be in A. fib with RVR and the hospitalist team was consulted.  Subjective / 24h Interval events: No chest pain, no palpitations.  When he developed A. fib with RVR yesterday again he thinks he was trying to get up with physical therapy and had severe pain  Assessment & Plan: Principal Problem Hypotension, presyncope due to A. fib with RVR-initially placed on diltiazem drip on 9/30, converted to sinus and was placed on diltiazem extended release 120 mg. On 10/1 he was in sinus rhythm but upon working with PT flipped back into A. fib with RVR.  I gave him her oral short acting diltiazem yesterday afternoon and this morning he is back into sinus rhythm.  Increase diltiazem CD to 180 today.  He is to work with PT again. -2D echo was fairly unremarkable, continue Eliquis  Active Problems Coronary artery disease with history of CABG-continue home medications  If he is still here tomorrow I will continue to follow  Scheduled Meds:  acidophilus  1 capsule Oral Daily   apixaban  5 mg Oral BID   atorvastatin  40 mg Oral QHS   diltiazem  180 mg Oral Daily   docusate sodium  100 mg Oral BID   multivitamin with minerals   Oral Daily   Continuous Infusions:  methocarbamol (ROBAXIN) IV 500 mg (11/30/20 1033)   PRN Meds:.acetaminophen, alum & mag hydroxide-simeth, bisacodyl, diphenhydrAMINE, HYDROmorphone (DILAUDID) injection, menthol-cetylpyridinium **OR** phenol, methocarbamol **OR** methocarbamol (ROBAXIN) IV, metoCLOPramide **OR** metoCLOPramide (REGLAN) injection, midodrine, ondansetron **OR** ondansetron (ZOFRAN) IV, oxyCODONE, oxyCODONE,  polyethylene glycol  Diet Orders (From admission, onward)     Start     Ordered   11/30/20 1419  Diet regular Room service appropriate? Yes; Fluid consistency: Thin  Diet effective now       Question Answer Comment  Room service appropriate? Yes   Fluid consistency: Thin      11/30/20 1418            DVT prophylaxis: SCDs Start: 11/30/20 1419 Place TED hose Start: 11/30/20 1419 apixaban (ELIQUIS) tablet 5 mg     Code Status: Full Code  Family Communication: No family at bedside  Level of care: Telemetry   Procedures:  2D echo  Microbiology  none  Antimicrobials: none    Objective: Vitals:   12/04/20 0200 12/04/20 0300 12/04/20 0400 12/04/20 0500  BP: 114/68 129/73 133/62 110/68  Pulse: (!) 123 (!) 124 (!) 132 (!) 118  Resp: 14 12 14 13   Temp:      TempSrc:      SpO2: 95% 94% 93% 94%  Weight:      Height:        Intake/Output Summary (Last 24 hours) at 12/04/2020 0856 Last data filed at 12/04/2020 0515 Gross per 24 hour  Intake 1411.8 ml  Output 1100 ml  Net 311.8 ml    Filed Weights   11/30/20 1449  Weight: 79.5 kg    Examination:  Constitutional: No distress, in chair Eyes: Anicteric ENMT: mmm Neck: normal, supple Respiratory: Clear bilaterally, no wheezing or crackles Cardiovascular: Regular rate and rhythm, no murmurs, no edema Abdomen: Soft, NT, ND, bowel sounds  positive Musculoskeletal: no clubbing / cyanosis.  Skin: No rash seen Neurologic: Nonfocal, equal strength   Data Reviewed: I have independently reviewed following labs and imaging studies  CBC: Recent Labs  Lab 12/01/20 0322 12/02/20 0324 12/03/20 0425  WBC 12.9* 10.9* 10.7*  HGB 10.7* 10.7* 10.4*  HCT 31.7* 32.0* 31.3*  MCV 89.8 89.6 91.3  PLT 173 153 277    Basic Metabolic Panel: Recent Labs  Lab 12/01/20 0322 12/02/20 1159  NA 136 137  K 4.3 4.1  CL 106 106  CO2 26 24  GLUCOSE 164* 106*  BUN 16 14  CREATININE 0.86 0.77  CALCIUM 8.6* 8.2*     Liver Function Tests: No results for input(s): AST, ALT, ALKPHOS, BILITOT, PROT, ALBUMIN in the last 168 hours. Coagulation Profile: No results for input(s): INR, PROTIME in the last 168 hours. HbA1C: No results for input(s): HGBA1C in the last 72 hours. CBG: No results for input(s): GLUCAP in the last 168 hours.  Recent Results (from the past 240 hour(s))  SARS Coronavirus 2 (TAT 6-24 hrs)     Status: None   Collection Time: 11/28/20 12:00 AM  Result Value Ref Range Status   SARS Coronavirus 2 RESULT: NEGATIVE  Final    Comment: RESULT: NEGATIVESARS-CoV-2 INTERPRETATION:A NEGATIVE  test result means that SARS-CoV-2 RNA was not present in the specimen above the limit of detection of this test. This does not preclude a possible SARS-CoV-2 infection and should not be used as the  sole basis for patient management decisions. Negative results must be combined with clinical observations, patient history, and epidemiological information. Optimum specimen types and timing for peak viral levels during infections caused by SARS-CoV-2  have not been determined. Collection of multiple specimens or types of specimens may be necessary to detect virus. Improper specimen collection and handling, sequence variability under primers/probes, or organism present below the limit of detection may  lead to false negative results. Positive and negative predictive values of testing are highly dependent on prevalence. False negative test results are more likely when prevalence of disease is high.The expected result is NEGATIVE.Fact S heet for  Healthcare Providers: LocalChronicle.no Sheet for Patients: SalonLookup.es Reference Range - Negative       Radiology Studies: No results found.  Marzetta Board, MD, PhD [facility]  Between 7 am - 7 pm I am available, please contact me via Amion (for emergencies) or Securechat (non urgent  messages)  Between 7 pm - 7 am I am not available, please contact night coverage MD/APP via Amion

## 2020-12-04 NOTE — Progress Notes (Signed)
DAILY PROGRESS NOTE   Patient Name: Donald [name] Date of Encounter: 12/04/2020 Cardiologist: Larae Grooms, MD  Chief Complaint   No complaints  Patient Profile   Donald [name] is a 75 y.o. male with a with a PMH of CAD s/p CABG in 2001, atrial flutter s/p ablation in 2011 no longer on anticoagulation, hypertension, hyperlipidemia, renal cancer s/p left nephrectomy 02/2019, who is being seen 12/02/2020 for the evaluation of atrial fibrillation at the request of Dr. Marylyn Ishihara.  Subjective   Some breakthrough afib yesterday, given extra diltiazem and now on 180 mg LA daily. Maintaining sinus.  Objective   Vitals:   12/04/20 0200 12/04/20 0300 12/04/20 0400 12/04/20 0500  BP: 114/68 129/73 133/62 110/68  Pulse: (!) 123 (!) 124 (!) 132 (!) 118  Resp: 14 12 14 13   Temp:      TempSrc:      SpO2: 95% 94% 93% 94%  Weight:      Height:        Intake/Output Summary (Last 24 hours) at 12/04/2020 8088 Last data filed at 12/04/2020 0515 Gross per 24 hour  Intake 1411.8 ml  Output 1100 ml  Net 311.8 ml   Filed Weights   11/30/20 1449  Weight: 79.5 kg    Physical Exam   Donald appearance: alert and no distress Neck: no carotid bruit, no JVD, and thyroid not enlarged, symmetric, no tenderness/mass/nodules Lungs: clear to auscultation bilaterally Heart: regular rate and rhythm, S1, S2 normal, no murmur, click, rub or gallop Abdomen: soft, non-tender; bowel sounds normal; no masses,  no organomegaly Extremities: extremities normal, atraumatic, no cyanosis or edema Pulses: 2+ and symmetric Skin: Skin color, texture, turgor normal. No rashes or lesions Neurologic: Grossly normal Psych: Pleasant  Inpatient Medications    Scheduled Meds:  acidophilus  1 capsule Oral Daily   apixaban  5 mg Oral BID   atorvastatin  40 mg Oral QHS   diltiazem  180 mg Oral Daily   docusate sodium  100 mg Oral BID   multivitamin with minerals   Oral Daily    Continuous Infusions:   methocarbamol (ROBAXIN) IV 500 mg (11/30/20 1033)    PRN Meds: acetaminophen, alum & mag hydroxide-simeth, bisacodyl, diphenhydrAMINE, HYDROmorphone (DILAUDID) injection, menthol-cetylpyridinium **OR** phenol, methocarbamol **OR** methocarbamol (ROBAXIN) IV, metoCLOPramide **OR** metoCLOPramide (REGLAN) injection, midodrine, ondansetron **OR** ondansetron (ZOFRAN) IV, oxyCODONE, oxyCODONE, polyethylene glycol   Labs   Results for orders placed or performed during the hospital encounter of 11/30/20 (from the past 48 hour(s))  Basic metabolic panel     Status: Abnormal   Collection Time: 12/02/20 11:59 AM  Result Value Ref Range   Sodium 137 135 - 145 mmol/L   Potassium 4.1 3.5 - 5.1 mmol/L   Chloride 106 98 - 111 mmol/L   CO2 24 22 - 32 mmol/L   Glucose, Bld 106 (H) 70 - 99 mg/dL    Comment: Glucose reference range applies only to samples taken after fasting for at least 8 hours.   BUN 14 8 - 23 mg/dL   Creatinine, Ser 0.77 0.61 - 1.24 mg/dL   Calcium 8.2 (L) 8.9 - 10.3 mg/dL   GFR, Estimated >60 >60 mL/min    Comment: (NOTE) Calculated using the CKD-EPI Creatinine Equation (2021)    Anion gap 7 5 - 15    Comment: Performed at [facility], [place] [address], [place], [place] [address]  Troponin I (High Sensitivity)     Status: Abnormal   Collection Time:  12/02/20  1:39 PM  Result Value Ref Range   Troponin I (High Sensitivity) 117 (HH) <18 ng/L    Comment: CRITICAL RESULT CALLED TO, READ BACK BY AND VERIFIED WITH: BHANDARI,K. RN @1453  ON 12/02/2020 BY COHEN,K (NOTE) Elevated high sensitivity troponin I (hsTnI) values and significant  changes across serial measurements may suggest ACS but many other  chronic and acute conditions are known to elevate hsTnI results.  Refer to the Links section for chest pain algorithms and additional  guidance. Performed at [facility], [place] [address], [place], [place] [address]   Troponin I (High  Sensitivity)     Status: Abnormal   Collection Time: 12/02/20  3:27 PM  Result Value Ref Range   Troponin I (High Sensitivity) 180 (HH) <18 ng/L    Comment: DELTA CHECK NOTED CRITICAL RESULT CALLED TO, READ BACK BY AND VERIFIED WITH: BHANDARI,N. RN @1621  ON 12/02/2020 BY COHEN,K (NOTE) Elevated high sensitivity troponin I (hsTnI) values and significant  changes across serial measurements may suggest ACS but many other  chronic and acute conditions are known to elevate hsTnI results.  Refer to the Links section for chest pain algorithms and additional  guidance. Performed at [facility], [place] [address], [place], [place] [address]   CBC     Status: Abnormal   Collection Time: 12/03/20  4:25 AM  Result Value Ref Range   WBC 10.7 (H) 4.0 - 10.5 K/uL   RBC 3.43 (L) 4.22 - 5.81 MIL/uL   Hemoglobin 10.4 (L) 13.0 - 17.0 g/dL   HCT 31.3 (L) 39.0 - 52.0 %   MCV 91.3 80.0 - 100.0 fL   MCH 30.3 26.0 - 34.0 pg   MCHC 33.2 30.0 - 36.0 g/dL   RDW 13.9 11.5 - 15.5 %   Platelets 166 150 - 400 K/uL   nRBC 0.0 0.0 - 0.2 %    Comment: Performed at [facility], [place] [address] [place], [place] [address]    ECG   N/A  Telemetry   Sinus rhythm in the 60's - Personally Reviewed  Radiology    ECHOCARDIOGRAM LIMITED  Result Date: 12/02/2020    ECHOCARDIOGRAM REPORT   Patient Name:   Donald [name] Date of Exam: 12/02/2020 Medical Rec #:  [id number]     Height:       65.0 in Accession #:    [id number]    Weight:       175.3 lb Date of Birth:  Jun 09, 1945      BSA:          1.870 m Patient Age:    56 years      BP:           111/56 mmHg Patient Gender: M             HR:           130 bpm. Exam Location:  Inpatient Procedure: Limited Echo and Color Doppler Indications:    I48.91* Unspecified atrial fibrillation  History:        Patient has prior history of Echocardiogram examinations, most                 recent 01/01/2020. Prior CABG, Arrythmias:Atrial Fibrillation;                  Risk Factors:Dyslipidemia.  Sonographer:    Raquel Sarna Senior RDCS Referring Phys: [id number] [place]  1. Left ventricular ejection fraction, by estimation, is  70 to 75%. The left ventricle has hyperdynamic function. The left ventricle has no regional wall motion abnormalities. Left ventricular diastolic function could not be evaluated.  2. Right ventricular systolic function is normal. The right ventricular size is normal.  3. The mitral valve is grossly normal. No evidence of mitral valve regurgitation.  4. The aortic valve is tricuspid. Aortic valve regurgitation is not visualized. No aortic stenosis is present.  5. The inferior vena cava is normal in size with <50% respiratory variability, suggesting right atrial pressure of 8 mmHg. Comparison(s): Changes from prior study are noted. 01/01/2020: LVEF 55-60%, normal atrial size. FINDINGS  Left Ventricle: Left ventricular ejection fraction, by estimation, is 70 to 75%. The left ventricle has hyperdynamic function. The left ventricle has no regional wall motion abnormalities. The left ventricular internal cavity size was normal in size. There is borderline left ventricular hypertrophy. Left ventricular diastolic function could not be evaluated due to atrial fibrillation. Left ventricular diastolic function could not be evaluated. Right Ventricle: The right ventricular size is normal. No increase in right ventricular wall thickness. Right ventricular systolic function is normal. Left Atrium: Left atrial size was normal in size. Right Atrium: Right atrial size was normal in size. Pericardium: There is no evidence of pericardial effusion. Mitral Valve: The mitral valve is grossly normal. No evidence of mitral valve regurgitation. Tricuspid Valve: The tricuspid valve is grossly normal. Tricuspid valve regurgitation is trivial. Aortic Valve: The aortic valve is tricuspid. Aortic valve regurgitation is not visualized. No aortic stenosis is  present. Pulmonic Valve: The pulmonic valve was grossly normal. Pulmonic valve regurgitation is not visualized. Aorta: The aortic root and ascending aorta are structurally normal, with no evidence of dilitation. Venous: The inferior vena cava is normal in size with less than 50% respiratory variability, suggesting right atrial pressure of 8 mmHg. IAS/Shunts: No atrial level shunt detected by color flow Doppler. EKG: Rhythm strip during this exam demostrated AFib wtih RVR. Lyman Bishop MD Electronically signed by Lyman Bishop MD Signature Date/Time: 12/02/2020/3:56:58 PM    Final     Cardiac Studies   See echo above  Assessment   Active Problems:   S/P TKR (total knee replacement) using cement   PAF (paroxysmal atrial fibrillation) ([place])   Plan   Some breakthrough afib with ambulation yesterday, given extra diltiazem and his long-acting dose has been increased. Could probably be discharged today if there is no recurrence with ambulation.  Time Spent Directly with Patient:  I have spent a total of 25 minutes with the patient reviewing hospital notes, telemetry, EKGs, labs and examining the patient as well as establishing an assessment and plan that was discussed personally with the patient.  > 50% of time was spent in direct patient care.  Length of Stay:  LOS: 2 days   Pixie Casino, MD, [facility], [place] Director of the [facility] Lipid Disorders &  Cardiovascular Risk Reduction [facility] Diplomate of the American Board of Clinical Lipidology Attending Cardiologist  Direct Dial: [phone]  Fax: [phone]  Website:  [email or web address] 12/04/2020, 9:28 AM

## 2020-12-04 NOTE — Progress Notes (Signed)
Physical Therapy Treatment Patient Details Name: Donald [name] MRN: [id number] DOB: [birth year] Today's Date: 12/04/2020   History of Present Illness Pt is a 75 y.o. male s/p L TKA on 11/30/20. PMH significant for CAD s/p CABG x5 , MI, OA,  RTCR (2004; 2007).    PT Comments    Pt cooperative but requiring increased time for all activities and unable to attempt standing/gait 2* drop in BP from 130/68 supine in recliner to 78/55 sitting up with feet on floor and with c/o dizziness.  RN aware.   Recommendations for follow up therapy are one component of a multi-disciplinary discharge planning process, led by the attending physician.  Recommendations may be updated based on patient status, additional functional criteria and insurance authorization.  Follow Up Recommendations  Follow surgeon's recommendation for DC plan and follow-up therapies;Home health PT     Equipment Recommendations  None recommended by PT    Recommendations for Other Services       Precautions / Restrictions Precautions Precautions: Fall Precaution Comments: instructed no pillow under knee Restrictions Weight Bearing Restrictions: No LLE Weight Bearing: Weight bearing as tolerated Other Position/Activity Restrictions: WBAT     Mobility  Bed Mobility               General bed mobility comments: Pt up in recliner and prefers to remain in same at session end    Transfers                 General transfer comment: Pt supine in chair and assisted to sitting upright with feet on floor but standing not attempted 2* c/o dizziness and BP drop from 130/68 to 78/55  Ambulation/Gait             General Gait Details: unable due to BP and dizziness   Stairs             Wheelchair Mobility    Modified Rankin (Stroke Patients Only)       Balance Overall balance assessment: Needs assistance Sitting-balance support: Feet supported Sitting balance-Leahy Scale: Good                                       Cognition Arousal/Alertness: Awake/alert Behavior During Therapy: [facility] for tasks assessed/performed                                          Exercises Total Joint Exercises Ankle Circles/Pumps: Seated;AROM;Both;20 reps Quad Sets: Seated;AROM;Strengthening;Both;10 reps Heel Slides: Seated;AAROM;Left;15 reps Straight Leg Raises: AAROM;Left;10 reps;Supine Goniometric ROM: AAROM L knee -5 - 45    General Comments        Pertinent Vitals/Pain Pain Assessment: 0-10 Pain Score: 5  Pain Location: Rt knee Pain Descriptors / Indicators: Aching;Sore;Grimacing;Guarding Pain Intervention(s): Limited activity within patient's tolerance;Monitored during session;Premedicated before session;Ice applied    Home Living                      Prior Function            PT Goals (current goals can now be found in the care plan section) Acute Rehab PT Goals Patient Stated Goal: Regain IND PT Goal Formulation: With patient Time For Goal Achievement: 12/14/20 Potential to Achieve Goals: Good Progress towards PT goals: Not progressing toward goals -  comment (orthostatic)    Frequency    7X/week      PT Plan Current plan remains appropriate    Co-evaluation              AM-PAC PT "6 Clicks" Mobility   Outcome Measure  Help needed turning from your back to your side while in a flat bed without using bedrails?: A Little Help needed moving from lying on your back to sitting on the side of a flat bed without using bedrails?: A Little Help needed moving to and from a bed to a chair (including a wheelchair)?: A Little Help needed standing up from a chair using your arms (e.g., wheelchair or bedside chair)?: A Little Help needed to walk in hospital room?: A Lot Help needed climbing 3-5 steps with a railing? : A Lot 6 Click Score: 16    End of Session Equipment Utilized During Treatment: Gait belt Activity Tolerance: Other  (comment) (orthostatic) Patient left: in chair;with call bell/phone within reach;with family/visitor present Nurse Communication: Mobility status;Other (comment) PT Visit Diagnosis: Unsteadiness on feet (R26.81);Muscle weakness (generalized) (M62.81);Pain Pain - Right/Left: Left Pain - part of body: Knee     Time: 1009-1036 PT Time Calculation (min) (ACUTE ONLY): 27 min  Charges:  $Therapeutic Exercise: 8-22 mins $Therapeutic Activity: 8-22 mins                     Debe Coder PT Acute Rehabilitation Services Pager [phone] Office [phone]    Arjun Hard 12/04/2020, 12:22 PM

## 2020-12-04 NOTE — Progress Notes (Signed)
Patient ID: Donald [name], male   DOB: 07-06-1945, 75 y.o.   MRN: [id number] Subjective: 4 Days Post-Op Procedure(s) (LRB): TOTAL KNEE ARTHROPLASTY (Left)    Patient reports pain as moderate. Seems a little confused or at least slowed mentation but unaware of his baseline  Went back into a-fib yesterday but with increased Diltiazem he has converted back to sinus rhythm   Objective:   VITALS:   Vitals:   12/04/20 0400 12/04/20 0500  BP: 133/62 110/68  Pulse: (!) 132 (!) 118  Resp: 14 13  Temp:    SpO2: 93% 94%    Neurovascular intact Incision: dressing C/D/I  LABS Recent Labs    12/02/20 0324 12/03/20 0425  HGB 10.7* 10.4*  HCT 32.0* 31.3*  WBC 10.9* 10.7*  PLT 153 166    Recent Labs    12/02/20 1159  NA 137  K 4.1  BUN 14  CREATININE 0.77  GLUCOSE 106*    No results for input(s): LABPT, INR in the last 72 hours.   Assessment/Plan: 4 Days Post-Op Procedure(s) (LRB): TOTAL KNEE ARTHROPLASTY (Left)   Up with therapy At this point I would recommend that he stay today and work with therapy and make certain that he remains in sinus rhythm for 24 hours after discussing with Dr. Cruzita Lederer.   Cardiology had signed off and whether needed or not can be determined by Donald [name] to follow up tomorrow to assess whether or not discharge can occur

## 2020-12-04 NOTE — Progress Notes (Signed)
Physical Therapy Treatment Patient Details Name: Donald [name] MRN: [id number] DOB: 1946-01-27 Today's Date: 12/04/2020   History of Present Illness Pt is a 75 y.o. male s/p L TKA on 11/30/20. PMH significant for CAD s/p CABG x5 , MI, OA,  RTCR (2004; 2007).    PT Comments    Pt continues cooperative and able to stand and ambulate limited distance in room - pt fatigues easily and continues orthostatic - BP in recliner 123/53; with feet on floor 93/62; standing 89/76; after ambulating across room 90/52 - RN aware.   Recommendations for follow up therapy are one component of a multi-disciplinary discharge planning process, led by the attending physician.  Recommendations may be updated based on patient status, additional functional criteria and insurance authorization.  Follow Up Recommendations  Follow surgeon's recommendation for DC plan and follow-up therapies;Home health PT     Equipment Recommendations  None recommended by PT    Recommendations for Other Services       Precautions / Restrictions Precautions Precautions: Fall Restrictions Weight Bearing Restrictions: No LLE Weight Bearing: Weight bearing as tolerated     Mobility  Bed Mobility               General bed mobility comments: Pt up in recliner and prefers to remain in same at session end    Transfers Overall transfer level: Needs assistance Equipment used: Rolling walker (2 wheeled) Transfers: Sit to/from Stand Sit to Stand: Min assist         General transfer comment: cues for use of UEs to self assist.  Physical assist to bring wt up and fwd and to balance in standing  Ambulation/Gait Ambulation/Gait assistance: Min assist;+2 safety/equipment (chair follow) Gait Distance (Feet): 8 Feet Assistive device: Rolling walker (2 wheeled) Gait Pattern/deviations: Step-to pattern;Decreased stride length;Decreased weight shift to left;Decreased stance time - left Gait velocity: decr   General Gait  Details: cues for sequence, assist to correct for posterior drift and manage RW   Stairs             Wheelchair Mobility    Modified Rankin (Stroke Patients Only)       Balance Overall balance assessment: Needs assistance Sitting-balance support: Feet supported Sitting balance-Leahy Scale: Good     Standing balance support: Bilateral upper extremity supported Standing balance-Leahy Scale: Poor                              Cognition Arousal/Alertness: Awake/alert Behavior During Therapy: WFL for tasks assessed/performed                                          Exercises      General Comments        Pertinent Vitals/Pain Pain Assessment: 0-10 Pain Score: 5  Pain Location: Rt knee Pain Descriptors / Indicators: Aching;Sore;Grimacing;Guarding Pain Intervention(s): Limited activity within patient's tolerance;Monitored during session;Premedicated before session;Ice applied    Home Living                      Prior Function            PT Goals (current goals can now be found in the care plan section) Acute Rehab PT Goals Patient Stated Goal: Regain IND PT Goal Formulation: With patient Time For Goal Achievement: 12/14/20 Potential to Achieve Goals:  Good Progress towards PT goals: Progressing toward goals    Frequency    7X/week      PT Plan Current plan remains appropriate    Co-evaluation              AM-PAC PT "6 Clicks" Mobility   Outcome Measure  Help needed turning from your back to your side while in a flat bed without using bedrails?: A Little Help needed moving from lying on your back to sitting on the side of a flat bed without using bedrails?: A Little Help needed moving to and from a bed to a chair (including a wheelchair)?: A Little Help needed standing up from a chair using your arms (e.g., wheelchair or bedside chair)?: A Little Help needed to walk in hospital room?: A Little Help  needed climbing 3-5 steps with a railing? : A Lot 6 Click Score: 17    End of Session Equipment Utilized During Treatment: Gait belt;Left knee immobilizer Activity Tolerance: Other (comment);Patient limited by fatigue (orthostatic) Patient left: in chair;with call bell/phone within reach Nurse Communication: Mobility status;Other (comment) PT Visit Diagnosis: Unsteadiness on feet (R26.81);Muscle weakness (generalized) (M62.81);Pain Pain - Right/Left: Left Pain - part of body: Knee     Time: 8676-1950 PT Time Calculation (min) (ACUTE ONLY): 22 min  Charges:  $Gait Training: 8-22 mins                     [place] Pager [phone] Office [phone]    Veola Cafaro 12/04/2020, 5:23 PM

## 2020-12-05 ENCOUNTER — Other Ambulatory Visit (HOSPITAL_COMMUNITY): Payer: Self-pay

## 2020-12-05 DIAGNOSIS — I4819 Other persistent atrial fibrillation: Secondary | ICD-10-CM

## 2020-12-05 LAB — HEPATIC FUNCTION PANEL
ALT: 13 U/L (ref 0–44)
AST: 21 U/L (ref 15–41)
Albumin: 2.7 g/dL — ABNORMAL LOW (ref 3.5–5.0)
Alkaline Phosphatase: 55 U/L (ref 38–126)
Bilirubin, Direct: 0.2 mg/dL (ref 0.0–0.2)
Indirect Bilirubin: 0.8 mg/dL (ref 0.3–0.9)
Total Bilirubin: 1 mg/dL (ref 0.3–1.2)
Total Protein: 5.9 g/dL — ABNORMAL LOW (ref 6.5–8.1)

## 2020-12-05 LAB — TSH: TSH: 1.118 u[IU]/mL (ref 0.350–4.500)

## 2020-12-05 MED ORDER — AMIODARONE HCL IN DEXTROSE 360-4.14 MG/200ML-% IV SOLN
30.0000 mg/h | INTRAVENOUS | Status: AC
  Administered 2020-12-05 – 2020-12-06 (×3): 30 mg/h via INTRAVENOUS
  Filled 2020-12-05 (×3): qty 200

## 2020-12-05 MED ORDER — SODIUM CHLORIDE 0.9 % IV BOLUS
500.0000 mL | Freq: Once | INTRAVENOUS | Status: AC
  Administered 2020-12-05: 500 mL via INTRAVENOUS

## 2020-12-05 MED ORDER — AMIODARONE LOAD VIA INFUSION
150.0000 mg | Freq: Once | INTRAVENOUS | Status: AC
  Administered 2020-12-05: 150 mg via INTRAVENOUS
  Filled 2020-12-05: qty 83.34

## 2020-12-05 MED ORDER — AMIODARONE HCL IN DEXTROSE 360-4.14 MG/200ML-% IV SOLN
60.0000 mg/h | INTRAVENOUS | Status: AC
  Administered 2020-12-05 (×2): 60 mg/h via INTRAVENOUS
  Filled 2020-12-05: qty 200

## 2020-12-05 NOTE — Progress Notes (Addendum)
PROGRESS NOTE  Donald [name] VOJ:[id number] DOB: 09/05/1945 DOA: 11/30/2020 PCP: Howard Pouch A, DO   LOS: 3 days   Brief Narrative / Interim history: 75 year old male with history of CAD status post CABG, hyperlipidemia, comes in for  elective knee replacement, and underwent TKR 9/28.  Had an episode of dizziness, hypotension on 9/30, was found to be in A. fib with RVR and the hospitalist team was consulted.  Subjective / 24h Interval events: Feels well, remains in sinus rhythm.  No chest pain, no abdominal pain, no nausea or vomiting.  He was orthostatic yesterday when he worked with PT.  Assessment & Plan: Principal Problem Hypotension, presyncope due to A. fib with RVR-initially placed on diltiazem drip on 9/30, converted to sinus and was placed on diltiazem extended release 120 mg. On 10/1 he was in sinus rhythm but upon working with PT flipped back into A. fib with RVR.  His diltiazem was increased to 180 and converted back to sinus rhythm -2D echo was fairly unremarkable, continue Eliquis  -Will give another bolus of 500 cc this morning, he tells me he has not been drinking as much as he should, so he is probably dehydrated.  I discussed with cardiology given soft blood pressures and orthostasis whether he can tolerate 180 mg of Cardizem or we should try lower dose versus alternative rate controlling agents.  He was bradycardic at 1 point with metoprolol in the past.  Cardiology will see patient shortly, defer to them choice of agents as well as dosing at this point and to be potentially discharged home pending cardiology input  Active Problems Coronary artery disease with history of CABG-continue home medications  If he is still here tomorrow I will continue to follow  Scheduled Meds:  acidophilus  1 capsule Oral Daily   apixaban  5 mg Oral BID   atorvastatin  40 mg Oral QHS   diltiazem  180 mg Oral Daily   docusate sodium  100 mg Oral BID   multivitamin with minerals   Oral  Daily   Continuous Infusions:  methocarbamol (ROBAXIN) IV 500 mg (11/30/20 1033)   PRN Meds:.acetaminophen, alum & mag hydroxide-simeth, bisacodyl, diphenhydrAMINE, HYDROmorphone (DILAUDID) injection, menthol-cetylpyridinium **OR** phenol, methocarbamol **OR** methocarbamol (ROBAXIN) IV, metoCLOPramide **OR** metoCLOPramide (REGLAN) injection, midodrine, ondansetron **OR** ondansetron (ZOFRAN) IV, oxyCODONE, oxyCODONE, polyethylene glycol  Diet Orders (From admission, onward)     Start     Ordered   11/30/20 1419  Diet regular Room service appropriate? Yes; Fluid consistency: Thin  Diet effective now       Question Answer Comment  Room service appropriate? Yes   Fluid consistency: Thin      11/30/20 1418            DVT prophylaxis: SCDs Start: 11/30/20 1419 Place TED hose Start: 11/30/20 1419 apixaban (ELIQUIS) tablet 5 mg     Code Status: Full Code  Family Communication: No family at bedside  Level of care: Telemetry   Procedures:  2D echo  Microbiology  none  Antimicrobials: none    Objective: Vitals:   12/04/20 2000 12/05/20 0407 12/05/20 0951 12/05/20 0952  BP: 124/63 (!) 128/55 99/65 108/63  Pulse: 80 79  75  Resp: 18 18    Temp: 99.4 F (37.4 C) 99.1 F (37.3 C)    TempSrc: Oral Oral    SpO2: 96% 93%    Weight:      Height:        Intake/Output Summary (Last 24 hours) at  12/05/2020 1056 Last data filed at 12/05/2020 0844 Gross per 24 hour  Intake 888.91 ml  Output 300 ml  Net 588.91 ml    Filed Weights   11/30/20 1449  Weight: 79.5 kg    Examination:  Constitutional: No distress, in chair Eyes: No scleral icterus ENMT: mmm Neck: normal, supple Respiratory: Clear bilaterally, no wheezing, no crackles Cardiovascular: Regular rate and rhythm, no murmurs, no peripheral edema Abdomen: Nondistended, bowel sounds positive Musculoskeletal: no clubbing / cyanosis.  Skin: No rashes seen Neurologic: No focal deficits   Data Reviewed: I  have independently reviewed following labs and imaging studies  CBC: Recent Labs  Lab 12/01/20 0322 12/02/20 0324 12/03/20 0425  WBC 12.9* 10.9* 10.7*  HGB 10.7* 10.7* 10.4*  HCT 31.7* 32.0* 31.3*  MCV 89.8 89.6 91.3  PLT 173 153 502    Basic Metabolic Panel: Recent Labs  Lab 12/01/20 0322 12/02/20 1159  NA 136 137  K 4.3 4.1  CL 106 106  CO2 26 24  GLUCOSE 164* 106*  BUN 16 14  CREATININE 0.86 0.77  CALCIUM 8.6* 8.2*    Liver Function Tests: No results for input(s): AST, ALT, ALKPHOS, BILITOT, PROT, ALBUMIN in the last 168 hours. Coagulation Profile: No results for input(s): INR, PROTIME in the last 168 hours. HbA1C: No results for input(s): HGBA1C in the last 72 hours. CBG: No results for input(s): GLUCAP in the last 168 hours.  Recent Results (from the past 240 hour(s))  SARS Coronavirus 2 (TAT 6-24 hrs)     Status: None   Collection Time: 11/28/20 12:00 AM  Result Value Ref Range Status   SARS Coronavirus 2 RESULT: NEGATIVE  Final    Comment: RESULT: NEGATIVESARS-CoV-2 INTERPRETATION:A NEGATIVE  test result means that SARS-CoV-2 RNA was not present in the specimen above the limit of detection of this test. This does not preclude a possible SARS-CoV-2 infection and should not be used as the  sole basis for patient management decisions. Negative results must be combined with clinical observations, patient history, and epidemiological information. Optimum specimen types and timing for peak viral levels during infections caused by SARS-CoV-2  have not been determined. Collection of multiple specimens or types of specimens may be necessary to detect virus. Improper specimen collection and handling, sequence variability under primers/probes, or organism present below the limit of detection may  lead to false negative results. Positive and negative predictive values of testing are highly dependent on prevalence. False negative test results are more likely when prevalence  of disease is high.The expected result is NEGATIVE.Fact S heet for  Healthcare Providers: LocalChronicle.no Sheet for Patients: SalonLookup.es Reference Range - Negative       Radiology Studies: No results found.  Marzetta Board, MD, PhD [facility]  Between 7 am - 7 pm I am available, please contact me via Amion (for emergencies) or Securechat (non urgent messages)  Between 7 pm - 7 am I am not available, please contact night coverage MD/APP via Amion

## 2020-12-05 NOTE — Progress Notes (Signed)
BP is 99/65 and recheck is 108/63 HR is 75. MD updated. Received order to hold Cardizem for now. Oralia Rud, RN

## 2020-12-05 NOTE — Progress Notes (Signed)
Subjective: 5 Days Post-Op Procedure(s) (LRB): TOTAL KNEE ARTHROPLASTY (Left) Patient reports pain as mild to moderate.    Objective: Vital signs in last 24 hours: Temp:  [99.1 F (37.3 C)-99.4 F (37.4 C)] 99.1 F (37.3 C) (10/03 0407) Pulse Rate:  [72-80] 72 (10/03 1409) Resp:  [18] 18 (10/03 1409) BP: (99-129)/(55-65) 122/58 (10/03 1632) SpO2:  [93 %-96 %] 93 % (10/03 0407)  Intake/Output from previous day: 10/02 0701 - 10/03 0700 In: 600 [P.O.:600] Out: 300 [Urine:300] Intake/Output this shift: Total I/O In: 518.7 [I.V.:109.8; IV Piggyback:408.9] Out: 200 [Urine:200]  Recent Labs    12/03/20 0425  HGB 10.4*   Recent Labs    12/03/20 0425  WBC 10.7*  RBC 3.43*  HCT 31.3*  PLT 166   No results for input(s): NA, K, CL, CO2, BUN, CREATININE, GLUCOSE, CALCIUM in the last 72 hours. No results for input(s): LABPT, INR in the last 72 hours.  Neurologically intact ABD soft Neurovascular intact Sensation intact distally Intact pulses distally Dorsiflexion/Plantar flexion intact Incision: dressing C/D/I and no drainage No cellulitis present Compartment soft No sign of DVT   Assessment/Plan: 5 Days Post-Op Procedure(s) (LRB): TOTAL KNEE ARTHROPLASTY (Left) Advance diet Up with therapy D/C IV fluids Plan for D/C when stable from cardiac standpoint  Cecilie Kicks 12/05/2020, 4:41 PM

## 2020-12-05 NOTE — Progress Notes (Addendum)
Physical Therapy Treatment Patient Details Name: Donald [name] MRN: [id number] DOB: [birth year] Today's Date: 12/05/2020   History of Present Illness Pt is a 75 y.o. male s/p L TKA on 11/30/20. PMH significant for CAD s/p CABG x5 , MI, OA,  RTCR (2004; 2007).    PT Comments    POD # 5 L TKR + A-Fib am session General Comments: pt is not as "Sprite/sharpe" as he was Friday. Mixing up words and breakinging up sentences.  following all commands and pleasant but appears to be masking some of his mental block. Pt already OOB in recliner.  Feeling "better".  Assisted with amb in hallway was unsuccessful.  General Gait Details: 50% VC's on proper sequencing and proper walker to self distance.  Unstaedy.  Shaky. required assist to navigate walker through doorway.  eyes shiffting.  Recliner following closely behind.  BP drops >20 Pt received another Bolus this morning so I took vitals   Supine in recliner               BP 118/62   HR 70 Seated                               BP   85/48    HR 82 Standing                            BP  90/66     HR 93   Pt c/o "fuzzy" 3 min                                  BP  88/53     HR 88  pt dizzy with unsteady gait 18 feet   Pt also took 10 mg OXY prior to session   Pt feels ":fine" when seated but once he gets moving he feels "fuzzy" and gait is unsteady.  Cardiology Consult much appreciated.   Ortho MD: can he be switch to a different pain med?  OXY may be contibuting to both BP's and unsteadiness he feels when moving+ mental clarity.    Will see pt again this afternoon as he is not met safe PT goals to D/C to home.  Have yet to practice stairs.  Recommendations for follow up therapy are one component of a multi-disciplinary discharge planning process, led by the attending physician.  Recommendations may be updated based on patient status, additional functional criteria and insurance authorization.  Follow Up Recommendations  Follow surgeon's recommendation for  DC plan and follow-up therapies;Home health PT     Equipment Recommendations  None recommended by PT    Recommendations for Other Services       Precautions / Restrictions Precautions Precautions: Fall Precaution Comments: instructed no pillow under knee Restrictions Weight Bearing Restrictions: No LLE Weight Bearing: Weight bearing as tolerated     Mobility  Bed Mobility               General bed mobility comments: OOB in recliner    Transfers Overall transfer level: Needs assistance Equipment used: Rolling walker (2 wheeled) Transfers: Sit to/from [facility] Sit to Stand: Min assist Stand pivot transfers: Min assist;Mod assist       General transfer comment: cues for use of UEs to self assist.  Physical assist to bring wt up and fwd  and to balance in standing.  shaky.  Unsteady initially.  Ambulation/Gait Ambulation/Gait assistance: Min assist;+2 safety/equipment Gait Distance (Feet): 11 Feet Assistive device: Rolling walker (2 wheeled) Gait Pattern/deviations: Step-to pattern;Decreased stride length;Decreased weight shift to left;Decreased stance time - left Gait velocity: decreased   General Gait Details: 50% VC's on proper sequencing and proper walker to self distance.  Unstaedy.  Shaky. required assist to navigate walker through doorway.  eyes shiffting.  Recliner following closely behind.  BP drops >20   Stairs             Wheelchair Mobility    Modified Rankin (Stroke Patients Only)       Balance                                            Cognition Arousal/Alertness: Awake/alert Behavior During Therapy: WFL for tasks assessed/performed Overall Cognitive Status: Impaired/Different from baseline                                 General Comments: pt is not as "Sprite/sharpe" as he was Friday. Mixing up words and breakinging up sentences.  following all commands and pleasant but appears to  be masking some of his mental block.      Exercises  Total Knee Replacement TE's following HEP handout 10 reps B LE ankle pumps 05 reps towel squeezes 05 reps knee presses 05 reps heel slides  05 reps SAQ's 05 reps SLR's 05 reps ABD Educated on use of gait belt to assist with TE's Followed by ICE     General Comments        Pertinent Vitals/Pain Pain Assessment: 0-10 Pain Score: 6  Pain Location: Rt knee Pain Descriptors / Indicators: Aching;Sore;Grimacing;Guarding;Operative site guarding Pain Intervention(s): Monitored during session;Premedicated before session;Repositioned;Ice applied    Home Living                      Prior Function            PT Goals (current goals can now be found in the care plan section) Progress towards PT goals: Progressing toward goals    Frequency    7X/week      PT Plan Current plan remains appropriate    Co-evaluation              AM-PAC PT "6 Clicks" Mobility   Outcome Measure  Help needed turning from your back to your side while in a flat bed without using bedrails?: A Little Help needed moving from lying on your back to sitting on the side of a flat bed without using bedrails?: A Little Help needed moving to and from a bed to a chair (including a wheelchair)?: A Little Help needed standing up from a chair using your arms (e.g., wheelchair or bedside chair)?: A Little Help needed to walk in hospital room?: A Little Help needed climbing 3-5 steps with a railing? : A Lot 6 Click Score: 17    End of Session Equipment Utilized During Treatment: Gait belt Activity Tolerance: Other (comment) (hypotensive) Patient left: in chair;with call bell/phone within reach Nurse Communication: Mobility status;Other (comment) PT Visit Diagnosis: Unsteadiness on feet (R26.81);Muscle weakness (generalized) (M62.81);Pain Pain - Right/Left: Left Pain - part of body: Knee     Time: 1005-1057 PT Time Calculation (  min)  (ACUTE ONLY): 52 min  Charges:  $Gait Training: 8-22 mins $Therapeutic Exercise: 8-22 mins $Therapeutic Activity: 8-22 mins                     {Lannah Koike  PTA Acute  Rehabilitation Services Pager      [phone] Office      [phone]

## 2020-12-05 NOTE — Progress Notes (Addendum)
PHYSICAL THERAPY  Pt received another Bolus this morning so I took vitals  Supine in recliner               BP 118/62   HR 70 Seated                               BP   85/48    HR 82 Standing                            BP  90/66     HR 93   Pt c/o "fuzzy" 3 min                                  BP  88/53     HR 88  pt dizzy with unsteady gait 18 feet  Pt also took 10 mg OXY prior to session  Pt feels ":fine" when seated but once he gets moving he feels "fuzzy" and gait is unsteady.  Cardiology Consult much appreciated.  Ortho MD: can he be switch to a different pain med?  OXY may be contibuting to both BP's and unsteadiness he feels when moving.  Will see pt again this afternoon as he is not met safe PT goals to D/C to home.  Have yet to practice stairs.  Rica Koyanagi  PTA Acute  Rehabilitation Services Pager      [phone] Office      [phone]

## 2020-12-05 NOTE — Progress Notes (Addendum)
Progress Note  Patient Name: Donald [name] Date of Encounter: 12/05/2020  [facility] Cardiologist: Larae Grooms, MD   Subjective   Donald [name] is doing well. He does feel his heart rate speed up.  Inpatient Medications    Scheduled Meds:  acidophilus  1 capsule Oral Daily   apixaban  5 mg Oral BID   atorvastatin  40 mg Oral QHS   diltiazem  180 mg Oral Daily   docusate sodium  100 mg Oral BID   multivitamin with minerals   Oral Daily   Continuous Infusions:  methocarbamol (ROBAXIN) IV 500 mg (11/30/20 1033)   PRN Meds: acetaminophen, alum & mag hydroxide-simeth, bisacodyl, diphenhydrAMINE, HYDROmorphone (DILAUDID) injection, menthol-cetylpyridinium **OR** phenol, methocarbamol **OR** methocarbamol (ROBAXIN) IV, metoCLOPramide **OR** metoCLOPramide (REGLAN) injection, midodrine, ondansetron **OR** ondansetron (ZOFRAN) IV, oxyCODONE, oxyCODONE, polyethylene glycol   Vital Signs    Vitals:   12/04/20 2000 12/05/20 0407 12/05/20 0951 12/05/20 0952  BP: 124/63 (!) 128/55 99/65 108/63  Pulse: 80 79  75  Resp: 18 18    Temp: 99.4 F (37.4 C) 99.1 F (37.3 C)    TempSrc: Oral Oral    SpO2: 96% 93%    Weight:      Height:        Intake/Output Summary (Last 24 hours) at 12/05/2020 1229 Last data filed at 12/05/2020 0844 Gross per 24 hour  Intake 888.91 ml  Output 300 ml  Net 588.91 ml   Last 3 Weights 11/30/2020 11/22/2020 11/01/2020  Weight (lbs) 175 lb 4.3 oz 156 lb 6.4 oz 159 lb  Weight (kg) 79.5 kg 70.943 kg 72.122 kg      Telemetry    SR and atrial fib - Personally Reviewed  ECG    none - Personally Reviewed  Physical Exam  Per MD  Labs    High Sensitivity Troponin:   Recent Labs  Lab 12/02/20 1339 12/02/20 1527  TROPONINIHS 117* 180*     Chemistry Recent Labs  Lab 12/01/20 0322 12/02/20 1159  NA 136 137  K 4.3 4.1  CL 106 106  CO2 26 24  GLUCOSE 164* 106*  BUN 16 14  CREATININE 0.86 0.77  CALCIUM 8.6* 8.2*  GFRNONAA >60 >60   ANIONGAP 4* 7    Lipids No results for input(s): CHOL, TRIG, HDL, LABVLDL, LDLCALC, CHOLHDL in the last 168 hours.  Hematology Recent Labs  Lab 12/01/20 0322 12/02/20 0324 12/03/20 0425  WBC 12.9* 10.9* 10.7*  RBC 3.53* 3.57* 3.43*  HGB 10.7* 10.7* 10.4*  HCT 31.7* 32.0* 31.3*  MCV 89.8 89.6 91.3  MCH 30.3 30.0 30.3  MCHC 33.8 33.4 33.2  RDW 13.4 13.6 13.9  PLT 173 153 166   Thyroid No results for input(s): TSH, FREET4 in the last 168 hours.  BNPNo results for input(s): BNP, PROBNP in the last 168 hours.  DDimer No results for input(s): DDIMER in the last 168 hours.   Radiology    No results found.  Cardiac Studies  Echo 12/02/20  IMPRESSIONS     1. Left ventricular ejection fraction, by estimation, is 70 to 75%. The  left ventricle has hyperdynamic function. The left ventricle has no  regional wall motion abnormalities. Left ventricular diastolic function  could not be evaluated.   2. Right ventricular systolic function is normal. The right ventricular  size is normal.   3. The mitral valve is grossly normal. No evidence of mitral valve  regurgitation.   4. The aortic valve is tricuspid. Aortic  valve regurgitation is not  visualized. No aortic stenosis is present.   5. The inferior vena cava is normal in size with <50% respiratory  variability, suggesting right atrial pressure of 8 mmHg.   Comparison(s): Changes from prior study are noted. 01/01/2020: LVEF  55-60%, normal atrial size.   FINDINGS   Left Ventricle: Left ventricular ejection fraction, by estimation, is 70  to 75%. The left ventricle has hyperdynamic function. The left ventricle  has no regional wall motion abnormalities. The left ventricular internal  cavity size was normal in size.  There is borderline left ventricular hypertrophy. Left ventricular  diastolic function could not be evaluated due to atrial fibrillation. Left  ventricular diastolic function could not be evaluated.   Right  Ventricle: The right ventricular size is normal. No increase in  right ventricular wall thickness. Right ventricular systolic function is  normal.   Left Atrium: Left atrial size was normal in size.   Right Atrium: Right atrial size was normal in size.   Pericardium: There is no evidence of pericardial effusion.   Mitral Valve: The mitral valve is grossly normal. No evidence of mitral  valve regurgitation.  Patient Profile     75 y.o. male with a with a PMH of CAD s/p CABG in 2001, atrial flutter s/p ablation in 2011 no longer on anticoagulation, hypertension, hyperlipidemia, renal cancer s/p left nephrectomy 02/2019 being followed by cards for atrial fib post for lt total knee.      Assessment & Plan    PAF developed post op this admit and placed on eliquis and dilt. [name].  Hx of a flutter ablation.   Yesterday with break through a fib with ambulation given extra dilt and his CD dose was increased.  Today with hypotension with increase of dilt. Though with follow up BP now 154/64.  Resume 180 of dilt ? pt still in and out of atrial fib.   --hx of bradycardia.  ? discharge       For questions or updates, please contact [place] Please consult [email or web address] for contact info under        Signed, Cecilie Kicks, NP  12/05/2020, 12:29 PM        Donald [name] is a 75 y/o male with history of atrial flutter s/p ablation in 2011, CABG 2001, HTN, HLD, presented for elective TKR 9/28 who developed new onset pAF with RVR on 9/30 started back on anticoagulation (eliquis) and diltiazem 120-180 mg. He has had persistent atrial fibrillation up to 120s on diltiazem. He is tolerating eliquis. His EF is normal and LA is normal as well. Considering he has not been able to tolerate BB, and has had orthostatic episodes with diltiazem, will plan for amiodraone IV infusion today and plan to transition to oral amiodraone 400 mg BID for 10 days (approximately an 8 g load) as  an outpatient to load followed by amiodraone 200 mg daily thereafter. Would recommend atrial fibrillation clinic for follow-up.  Vitals:   12/05/20 0951 12/05/20 0952  BP: 99/65 108/63  Pulse:  75  Resp:    Temp:    SpO2:     Physical Exam HENT:     Mouth/Throat:     Mouth: Mucous membranes are dry.  Cardiovascular:     Rate and Rhythm: Rhythm irregular.     Pulses:          Dorsalis pedis pulses are 2+ on the right side.  Pulmonary:  Effort: Pulmonary effort is normal.     Breath sounds: Normal breath sounds.  Abdominal:     Palpations: Abdomen is soft.  Skin:    General: Skin is warm.  Neurological:     Mental Status: He is alert.  Psychiatric:        Mood and Affect: Mood normal.    Janina Mayo MD, MS

## 2020-12-05 NOTE — Progress Notes (Signed)
Subjective: 5 Days Post-Op Procedure(s) (LRB): TOTAL KNEE ARTHROPLASTY (Left) Patient reports pain as 3 on 0-10 scale.   Denies CP or SOB.  Voiding without difficulty. Positive flatus. Objective: Vital signs in last 24 hours: Temp:  [99.1 F (37.3 C)-99.4 F (37.4 C)] 99.1 F (37.3 C) (10/03 0407) Pulse Rate:  [72-80] 72 (10/03 1409) Resp:  [18] 18 (10/03 1409) BP: (99-129)/(55-65) 122/58 (10/03 1632) SpO2:  [93 %-96 %] 93 % (10/03 0407)  Intake/Output from previous day: 10/02 0701 - 10/03 0700 In: 600 [P.O.:600] Out: 300 [Urine:300] Intake/Output this shift: Total I/O In: 518.7 [I.V.:109.8; IV Piggyback:408.9] Out: 200 [Urine:200]  Recent Labs    12/03/20 0425  HGB 10.4*   Recent Labs    12/03/20 0425  WBC 10.7*  RBC 3.43*  HCT 31.3*  PLT 166   No results for input(s): NA, K, CL, CO2, BUN, CREATININE, GLUCOSE, CALCIUM in the last 72 hours. No results for input(s): LABPT, INR in the last 72 hours.  Neurologically intact -DVT Moderate swelling  Assessment/Plan:  5 Days Post-Op Procedure(s) (LRB): TOTAL KNEE ARTHROPLASTY (Left) Advance diet Plan for discharge tomorrow if rate controlled TEDS SCD's   Active Problems:   S/P TKR (total knee replacement) using cement   PAF (paroxysmal atrial fibrillation) ([place])      Donald [name] 12/05/2020, @[facility] 

## 2020-12-05 NOTE — Progress Notes (Signed)
Physical Therapy Treatment Patient Details Name: Donald [name] MRN: [id number] DOB: 1945-05-17 Today's Date: 12/05/2020   History of Present Illness Pt is a 75 y.o. male s/p L TKA on 11/30/20. PMH significant for CAD s/p CABG x5 , MI, OA,  RTCR (2004; 2007).    PT Comments    POD # 5 L TKR + A-Fib pm session Slight improvement but able to amb safely enough to D/C to home today. General Gait Details: tolerated an increased distance with less c/o feeling "fuzzy" but still present along with  an unsteady shaky gait.  BP decreased to 88/53 with a MAP of 54. recliner following for safety.  Supine       BP 120/55  HR 72 Standing    BP  76/41   HR 80 3 min          BP 85/39    HR 82   MAP 54 Back in recliner   BP 137/92  MAP 97  Pt was given 650 mg Tylenol just prior to session and 500 mg Robaxin during session for pain control  Performed some TKR TE's L knee approx 10 - 70 degrees AAROM  Will continue to follow and see tomorrow   Recommendations for follow up therapy are one component of a multi-disciplinary discharge planning process, led by the attending physician.  Recommendations may be updated based on patient status, additional functional criteria and insurance authorization.  Follow Up Recommendations  Follow surgeon's recommendation for DC plan and follow-up therapies;Home health PT     Equipment Recommendations  None recommended by PT    Recommendations for Other Services       Precautions / Restrictions Precautions Precautions: Fall Precaution Comments: instructed no pillow under knee Restrictions Weight Bearing Restrictions: No LLE Weight Bearing: Weight bearing as tolerated     Mobility  Bed Mobility               General bed mobility comments: OOB in recliner    Transfers Overall transfer level: Needs assistance Equipment used: Rolling walker (2 wheeled) Transfers: Sit to/from [facility] Sit to Stand: Min assist Stand pivot  transfers: Min assist;Mod assist       General transfer comment: cues for use of UEs to self assist.  Physical assist to bring wt up and fwd and to balance in standing.  shaky.  Unsteady initially.  Ambulation/Gait Ambulation/Gait assistance: Min assist;+2 safety/equipment Gait Distance (Feet): 26 Feet Assistive device: Rolling walker (2 wheeled) Gait Pattern/deviations: Step-to pattern;Decreased stride length;Decreased weight shift to left;Decreased stance time - left Gait velocity: decreased   General Gait Details: tolerated an increased distance with less c/o feeling "fuzzy" but still present along with  an unsteady shaky gait.  BP decreased to 88/53 with a MAP of 54. recliner following for safety.   Stairs             Wheelchair Mobility    Modified Rankin (Stroke Patients Only)       Balance                                            Cognition Arousal/Alertness: Awake/alert Behavior During Therapy: WFL for tasks assessed/performed Overall Cognitive Status: Impaired/Different from baseline  General Comments: pt is not as "Sprite/sharpe" as he was Friday. Mixing up words and breakinging up sentences.  following all commands and pleasant but appears to be masking some of his mental block.      Exercises      General Comments        Pertinent Vitals/Pain Pain Assessment: 0-10 Pain Score: 6  Pain Location: Rt knee Pain Descriptors / Indicators: Aching;Sore;Grimacing;Guarding;Operative site guarding Pain Intervention(s): Monitored during session;Premedicated before session;Repositioned;Ice applied    Home Living                      Prior Function            PT Goals (current goals can now be found in the care plan section) Progress towards PT goals: Progressing toward goals    Frequency    7X/week      PT Plan Current plan remains appropriate    Co-evaluation               AM-PAC PT "6 Clicks" Mobility   Outcome Measure  Help needed turning from your back to your side while in a flat bed without using bedrails?: A Little Help needed moving from lying on your back to sitting on the side of a flat bed without using bedrails?: A Little Help needed moving to and from a bed to a chair (including a wheelchair)?: A Little Help needed standing up from a chair using your arms (e.g., wheelchair or bedside chair)?: A Little Help needed to walk in hospital room?: A Little Help needed climbing 3-5 steps with a railing? : A Lot 6 Click Score: 17    End of Session Equipment Utilized During Treatment: Gait belt Activity Tolerance: Other (comment) (hypotensive) Patient left: in chair;with call bell/phone within reach Nurse Communication: Mobility status;Other (comment) PT Visit Diagnosis: Unsteadiness on feet (R26.81);Muscle weakness (generalized) (M62.81);Pain Pain - Right/Left: Left Pain - part of body: Knee     Time: 1441-1516 PT Time Calculation (min) (ACUTE ONLY): 35 min  Charges:  $Gait Training: 8-22 mins $Therapeutic Exercise: 8-22 mins                     {Manya Balash  PTA Acute  Rehabilitation Services Pager      [phone] Office      [phone]'

## 2020-12-06 MED ORDER — AMIODARONE HCL 200 MG PO TABS
400.0000 mg | ORAL_TABLET | Freq: Two times a day (BID) | ORAL | Status: DC
  Administered 2020-12-06 – 2020-12-10 (×7): 400 mg via ORAL
  Filled 2020-12-06 (×8): qty 2

## 2020-12-06 MED ORDER — HYDROCODONE-ACETAMINOPHEN 5-325 MG PO TABS
1.0000 | ORAL_TABLET | Freq: Four times a day (QID) | ORAL | Status: DC | PRN
  Administered 2020-12-07 – 2020-12-10 (×10): 1 via ORAL
  Filled 2020-12-06 (×10): qty 1

## 2020-12-06 NOTE — Progress Notes (Signed)
Physical Therapy Treatment Patient Details Name: Donald [name] MRN: [id number] DOB: 1946/01/11 Today's Date: 12/06/2020   History of Present Illness Pt is a 75 y.o. male s/p L TKA on 11/30/20. PMH significant for CAD s/p CABG x5 , MI, OA,  RTCR (2004; 2007).    PT Comments    POD # 6 General Comments: slowly improving cognition and level od alertness now that he is no longer taking OXY.  Not yet 100% bu better. Pt still in recliner.  Stated he stayed there all night.  Assisted pt to bathroom.  Extended time seated on toilet for a BM (has not done so since surgery) also assisted with a hygiene/bath/gown change and noted increased edema from L knee up all through his thigh/buttock area.  Reported to Dr Tonita Cong when he arrived to room. Pt has been receiving multiple Bolus and cont on IV fluids as well as staying in recliner 24/7 inactive.  Pt did amb in hallway 55 feet at [employer] while spouse followed with recliner.  Returned to room and perform a few TE's.  Applied ICE and elevated L LE. Will see pt again this afternoon.   Pt hopes to D/C to home tomorrow pending medical.   Recommendations for follow up therapy are one component of a multi-disciplinary discharge planning process, led by the attending physician.  Recommendations may be updated based on patient status, additional functional criteria and insurance authorization.  Follow Up Recommendations  Home health PT     Equipment Recommendations       Recommendations for Other Services       Precautions / Restrictions Precautions Precautions: Fall Precaution Comments: instructed no pillow under knee and to elevate to decrease swelling Restrictions Weight Bearing Restrictions: No LLE Weight Bearing: Weight bearing as tolerated     Mobility  Bed Mobility               General bed mobility comments: OOB in recliner    Transfers Overall transfer level: Needs assistance Equipment used: Rolling walker (2 wheeled) Transfers:  Sit to/from [facility] Sit to Stand: Min assist Stand pivot transfers: Min assist;Mod assist       General transfer comment: cues for use of UEs to self assist.  Physical assist to bring wt up and fwd and to balance in standing.  shaky.  Unsteady initially. slight improvement from yesterday.  Also assisted with a toilet teransfer.  Ambulation/Gait Ambulation/Gait assistance: Min assist;+2 safety/equipment Gait Distance (Feet): 55 Feet Assistive device: Rolling walker (2 wheeled) Gait Pattern/deviations: Step-to pattern;Decreased stride length;Decreased weight shift to left;Decreased stance time - left Gait velocity: decreased   General Gait Details: tolerated amb in hallway an increased distance with spouse following with recliner.  Pt's ststed he did not get "fuzzy" today.  "feeling better".  BP was actually elevated 154/87.  50 % VC's on proper sequencing to perform step to gait.   Stairs             Wheelchair Mobility    Modified Rankin (Stroke Patients Only)       Balance                                            Cognition Arousal/Alertness: Awake/alert Behavior During Therapy: WFL for tasks assessed/performed Overall Cognitive Status: Impaired/Different from baseline  General Comments: slowly improving cognition and level od alertness now that he is no longer taking OXY.  Not yet 100% bu better.      Exercises  Total Knee Replacement TE's following HEP handout 10 reps B LE ankle pumps 05 reps towel squeezes 05 reps knee presses 05 reps heel slides  05 reps SAQ's 05 reps SLR's 05 reps ABD Educated on use of gait belt to assist with TE's Followed by ICE     General Comments        Pertinent Vitals/Pain Pain Assessment: 0-10 Pain Score: 3  Pain Location: Rt knee Pain Descriptors / Indicators: Aching;Sore;Grimacing;Guarding;Operative site guarding Pain  Intervention(s): Monitored during session;Premedicated before session;Repositioned;Ice applied    Home Living                      Prior Function            PT Goals (current goals can now be found in the care plan section) Progress towards PT goals: Progressing toward goals    Frequency    7X/week      PT Plan Current plan remains appropriate    Co-evaluation              AM-PAC PT "6 Clicks" Mobility   Outcome Measure  Help needed turning from your back to your side while in a flat bed without using bedrails?: A Little Help needed moving from lying on your back to sitting on the side of a flat bed without using bedrails?: A Little Help needed moving to and from a bed to a chair (including a wheelchair)?: A Little Help needed standing up from a chair using your arms (e.g., wheelchair or bedside chair)?: A Little Help needed to walk in hospital room?: A Little Help needed climbing 3-5 steps with a railing? : A Lot 6 Click Score: 17    End of Session Equipment Utilized During Treatment: Gait belt Activity Tolerance: Patient tolerated treatment well Patient left: in chair;with call bell/phone within reach;with chair alarm set;with family/visitor present Nurse Communication: Mobility status PT Visit Diagnosis: Unsteadiness on feet (R26.81);Muscle weakness (generalized) (M62.81);Pain Pain - Right/Left: Left Pain - part of body: Knee     Time: 1140-1235 PT Time Calculation (min) (ACUTE ONLY): 55 min  Charges:  $Gait Training: 8-22 mins $Therapeutic Exercise: 8-22 mins $Therapeutic Activity: 23-37 mins                     {Ansleigh Safer  PTA Acute  Rehabilitation Services Pager      [phone] Office      [phone]

## 2020-12-06 NOTE — Progress Notes (Signed)
Physical Therapy Treatment Patient Details Name: Donald [name] MRN: [id number] DOB: April 20, 1945 Today's Date: 12/06/2020   History of Present Illness Pt is a 75 y.o. male s/p L TKA on 11/30/20. PMH significant for CAD s/p CABG x5 , MI, OA,  RTCR (2004; 2007).    PT Comments    POD # 6 pm session Pt progressing well now that his A Fib is under control and he is no longer taking OXY for pain.   General Gait Details: tolerated an even greater increased distance in hallway with less VC's on sequencing and present with increased stability.  NO c/o dizziness.  Pain 4/10.  Only c/o was his R shoulder discomfort from using supporting self on walker. General bed mobility comments: pt self able using his belt to get L LE up onto bed with one VC.  Elevated his L LE using 2 pillows, applied SCD's as well as ICE.   Pt hopes to D/C to home tomorrow.  Will see pt in morning to practice stairs.   Recommendations for follow up therapy are one component of a multi-disciplinary discharge planning process, led by the attending physician.  Recommendations may be updated based on patient status, additional functional criteria and insurance authorization.  Follow Up Recommendations  Home health PT     Equipment Recommendations  None recommended by PT    Recommendations for Other Services       Precautions / Restrictions Precautions Precautions: Fall Precaution Comments: instructed no pillow under knee and to elevate to decrease swelling Restrictions Weight Bearing Restrictions: No LLE Weight Bearing: Weight bearing as tolerated Other Position/Activity Restrictions: WBAT     Mobility  Bed Mobility Overal bed mobility: Needs Assistance Bed Mobility: Sit to Supine       Sit to supine: Supervision;Min guard   General bed mobility comments: pt self able using his belt to get L LE up onto bed with one VC    Transfers Overall transfer level: Needs assistance Equipment used: Rolling walker (2  wheeled) Transfers: Sit to/from [facility] Sit to Stand: Min guard Stand pivot transfers: Min guard;Min assist       General transfer comment: increased self ability to rise with slight vs moderate instability.  Progressing  Ambulation/Gait Ambulation/Gait assistance: Supervision;Min guard Gait Distance (Feet): 95 Feet Assistive device: Rolling walker (2 wheeled) Gait Pattern/deviations: Step-to pattern;Decreased stride length;Decreased weight shift to left;Decreased stance time - left Gait velocity: decreased   General Gait Details: tolerated an even greater increased distance in hallway with less VC's on sequencing and present with increased stability.  NO c/o dizziness.  Pain 4/10.  Only c/o was his R shoulder discomfort from using supporting self on walker.   Stairs             Wheelchair Mobility    Modified Rankin (Stroke Patients Only)       Balance                                            Cognition Arousal/Alertness: Awake/alert Behavior During Therapy: WFL for tasks assessed/performed Overall Cognitive Status: Impaired/Different from baseline                                 General Comments: slowly improving cognition and level od alertness now that he is no longer  taking OXY.  Increased ability to recall and retain.      Exercises      General Comments        Pertinent Vitals/Pain Pain Assessment: 0-10 Pain Score: 4  Pain Location: Rt knee Pain Descriptors / Indicators: Aching;Sore;Grimacing;Guarding;Operative site guarding Pain Intervention(s): Monitored during session;Premedicated before session;Repositioned;Ice applied    Home Living                      Prior Function            PT Goals (current goals can now be found in the care plan section) Progress towards PT goals: Progressing toward goals    Frequency    7X/week      PT Plan Current plan remains appropriate     Co-evaluation              AM-PAC PT "6 Clicks" Mobility   Outcome Measure  Help needed turning from your back to your side while in a flat bed without using bedrails?: A Little Help needed moving from lying on your back to sitting on the side of a flat bed without using bedrails?: A Little Help needed moving to and from a bed to a chair (including a wheelchair)?: A Little Help needed standing up from a chair using your arms (e.g., wheelchair or bedside chair)?: A Little Help needed to walk in hospital room?: A Little Help needed climbing 3-5 steps with a railing? : A Little 6 Click Score: 18    End of Session Equipment Utilized During Treatment: Gait belt Activity Tolerance: Patient tolerated treatment well Patient left: in bed;with call bell/phone within reach;with bed alarm set Nurse Communication: Mobility status PT Visit Diagnosis: Unsteadiness on feet (R26.81);Muscle weakness (generalized) (M62.81);Pain Pain - Right/Left: Left Pain - part of body: Knee     Time: 2595-6387 PT Time Calculation (min) (ACUTE ONLY): 28 min  Charges:  $Gait Training: 8-22 mins $Therapeutic Activity: 8-22 mins                     Rica Koyanagi  PTA Acute  Rehabilitation Services Pager      [phone] Office      [phone]

## 2020-12-06 NOTE — Progress Notes (Signed)
Progress Note  Patient Name: Donald [name] Date of Encounter: 12/06/2020  [facility] Cardiologist: Larae Grooms, MD   Subjective   [name] is doing well today, he worked with physical therapy and can walk around the hallway. He had some leg swelling on the left proximal from his leg swelling. Otherwise, he is eager to go home  Interval Hx: His hearts have been in normal in sinus rhythm on the amiodarone.   Inpatient Medications    Scheduled Meds:  acidophilus  1 capsule Oral Daily   amiodarone  400 mg Oral BID   apixaban  5 mg Oral BID   atorvastatin  40 mg Oral QHS   diltiazem  180 mg Oral Daily   docusate sodium  100 mg Oral BID   multivitamin with minerals   Oral Daily   Continuous Infusions:  amiodarone 30 mg/hr (12/06/20 0358)   methocarbamol (ROBAXIN) IV 500 mg (11/30/20 1033)   PRN Meds: acetaminophen, alum & mag hydroxide-simeth, bisacodyl, diphenhydrAMINE, HYDROcodone-acetaminophen, menthol-cetylpyridinium **OR** phenol, methocarbamol **OR** methocarbamol (ROBAXIN) IV, metoCLOPramide **OR** metoCLOPramide (REGLAN) injection, midodrine, ondansetron **OR** ondansetron (ZOFRAN) IV, polyethylene glycol   Vital Signs    Vitals:   12/06/20 0900 12/06/20 1020 12/06/20 1130 12/06/20 1335  BP:  (!) 121/55 (!) 122/58 (!) 131/59  Pulse:    69  Resp: 18  16 16   Temp:      TempSrc:      SpO2:    99%  Weight:      Height:         Physical Exam HENT:     Mouth/Throat:     Mouth: Mucous membranes are mildly dry Cardiovascular:     Rate and Rhythm: Rhythm irregular.     Pulses:          Dorsalis pedis pulses are 2+ on the right side.  Pulmonary:     Effort: Pulmonary effort is normal.     Breath sounds: Normal breath sounds.  Abdominal:     Palpations: Abdomen is soft.  Skin:    General: Skin is warm.  Neurological:     Mental Status: He is alert.  Psychiatric:        Mood and Affect: Mood normal.       Intake/Output Summary (Last 24 hours)  at 12/06/2020 1531 Last data filed at 12/06/2020 1241 Gross per 24 hour  Intake 622.97 ml  Output 1550 ml  Net -927.03 ml    Last 3 Weights 11/30/2020 11/22/2020 11/01/2020  Weight (lbs) 175 lb 4.3 oz 156 lb 6.4 oz 159 lb  Weight (kg) 79.5 kg 70.943 kg 72.122 kg      Telemetry    Sinus rhythm with PACs - Personally Reviewed  ECG     12/05/2020: Sinus rhythm with PACs HR 73 bpm- Personally Reviewed  Physical Exam  Per MD  Labs    High Sensitivity Troponin:   Recent Labs  Lab 12/02/20 1339 12/02/20 1527  TROPONINIHS 117* 180*      Chemistry Recent Labs  Lab 12/01/20 0322 12/02/20 1159 12/05/20 1341  NA 136 137  --   K 4.3 4.1  --   CL 106 106  --   CO2 26 24  --   GLUCOSE 164* 106*  --   BUN 16 14  --   CREATININE 0.86 0.77  --   CALCIUM 8.6* 8.2*  --   PROT  --   --  5.9*  ALBUMIN  --   --  2.7*  AST  --   --  21  ALT  --   --  13  ALKPHOS  --   --  55  BILITOT  --   --  1.0  GFRNONAA >60 >60  --   ANIONGAP 4* 7  --      Lipids No results for input(s): CHOL, TRIG, HDL, LABVLDL, LDLCALC, CHOLHDL in the last 168 hours.  Hematology Recent Labs  Lab 12/01/20 0322 12/02/20 0324 12/03/20 0425  WBC 12.9* 10.9* 10.7*  RBC 3.53* 3.57* 3.43*  HGB 10.7* 10.7* 10.4*  HCT 31.7* 32.0* 31.3*  MCV 89.8 89.6 91.3  MCH 30.3 30.0 30.3  MCHC 33.8 33.4 33.2  RDW 13.4 13.6 13.9  PLT 173 153 166    Thyroid  Recent Labs  Lab 12/05/20 1341  TSH 1.118     BNPNo results for input(s): BNP, PROBNP in the last 168 hours.  DDimer No results for input(s): DDIMER in the last 168 hours.   Radiology    No results found.  Cardiac Studies  Echo 12/02/20  IMPRESSIONS     1. Left ventricular ejection fraction, by estimation, is 70 to 75%. The  left ventricle has hyperdynamic function. The left ventricle has no  regional wall motion abnormalities. Left ventricular diastolic function  could not be evaluated.   2. Right ventricular systolic function is normal. The  right ventricular  size is normal.   3. The mitral valve is grossly normal. No evidence of mitral valve  regurgitation.   4. The aortic valve is tricuspid. Aortic valve regurgitation is not  visualized. No aortic stenosis is present.   5. The inferior vena cava is normal in size with <50% respiratory  variability, suggesting right atrial pressure of 8 mmHg.   Comparison(s): Changes from prior study are noted. 01/01/2020: LVEF  55-60%, normal atrial size.   FINDINGS   Left Ventricle: Left ventricular ejection fraction, by estimation, is 70  to 75%. The left ventricle has hyperdynamic function. The left ventricle  has no regional wall motion abnormalities. The left ventricular internal  cavity size was normal in size.  There is borderline left ventricular hypertrophy. Left ventricular  diastolic function could not be evaluated due to atrial fibrillation. Left  ventricular diastolic function could not be evaluated.   Right Ventricle: The right ventricular size is normal. No increase in  right ventricular wall thickness. Right ventricular systolic function is  normal.   Left Atrium: Left atrial size was normal in size.   Right Atrium: Right atrial size was normal in size.   Pericardium: There is no evidence of pericardial effusion.   Mitral Valve: The mitral valve is grossly normal. No evidence of mitral  valve regurgitation.  Patient Profile     75 y.o. male with a with a PMH of CAD s/p CABG in 2001, atrial flutter s/p ablation in 2011 no longer on anticoagulation, hypertension, hyperlipidemia, renal cancer s/p left nephrectomy 02/2019 being followed by cards for atrial fib post for lt total knee.      Assessment & Plan    PAF developed post op this admit and placed on eliquis and dilt. [name].  Hx of a flutter ablation. He had  break through a fib with ambulation given extra dilt and his CD dose was increased.  10/2-10/3 was hypotension with increase of  dilt to 180 mg. Considering his fluctuations with blood pressure, we transitioned him to amiodraone IV infusion and he has  converted -->continue eliquis renally dosed -->started amiodarone 400 mg BID, plan to stop infusion and start pills tonight --> can continue amiodarone 400 mg BID for 10 days with a plan to transition to amiodarone 200 mg daily -->Would recommend atrial fibrillation clinic for follow-up. -->we can follow peripherally, we do not have reservations for his discharge  For questions or updates, please contact [facility] Please consult [email or web address] for contact info under        Signed, Janina Mayo, MD  12/06/2020, 3:31 PM

## 2020-12-06 NOTE — Progress Notes (Addendum)
PROGRESS NOTE  Donald [name] SEG:[id number] DOB: 08/10/1945 DOA: 11/30/2020 PCP: Howard Pouch A, DO   LOS: 4 days   Brief Narrative / Interim history: 75 year old male with history of CAD status post CABG, hyperlipidemia, comes in for  elective knee replacement, and underwent TKR 9/28.  Had an episode of dizziness, hypotension on 9/30, was found to be in A. fib with RVR and the hospitalist team was consulted.  Subjective / 24h Interval events: Frustrated about being here.  She wants to go home  Assessment & Plan: Principal Problem Hypotension, presyncope due to A. fib with RVR-initially placed on diltiazem drip on 9/30, converted to sinus and was placed on oral diltiazem, however flipped in and out of A. fib.  Cardiology following as well, patient was placed on IV amiodarone 10/3.  Further work-up per cardiology, timing of discharge per cardiology -2D echo was fairly unremarkable, continue Eliquis  Active Problems Coronary artery disease with history of CABG-continue home medications  Will see patient in am. If he remains in sinus / rate controlled will be ready for dc   Scheduled Meds:  acidophilus  1 capsule Oral Daily   apixaban  5 mg Oral BID   atorvastatin  40 mg Oral QHS   diltiazem  180 mg Oral Daily   docusate sodium  100 mg Oral BID   multivitamin with minerals   Oral Daily   Continuous Infusions:  amiodarone 30 mg/hr (12/06/20 0358)   methocarbamol (ROBAXIN) IV 500 mg (11/30/20 1033)   PRN Meds:.acetaminophen, alum & mag hydroxide-simeth, bisacodyl, diphenhydrAMINE, HYDROmorphone (DILAUDID) injection, menthol-cetylpyridinium **OR** phenol, methocarbamol **OR** methocarbamol (ROBAXIN) IV, metoCLOPramide **OR** metoCLOPramide (REGLAN) injection, midodrine, ondansetron **OR** ondansetron (ZOFRAN) IV, oxyCODONE, oxyCODONE, polyethylene glycol  Diet Orders (From admission, onward)     Start     Ordered   11/30/20 1419  Diet regular Room service appropriate? Yes; Fluid  consistency: Thin  Diet effective now       Question Answer Comment  Room service appropriate? Yes   Fluid consistency: Thin      11/30/20 1418            DVT prophylaxis: Place and maintain sequential compression device Start: 12/05/20 1659 SCDs Start: 11/30/20 1419 Place TED hose Start: 11/30/20 1419 apixaban (ELIQUIS) tablet 5 mg     Code Status: Full Code  Family Communication: No family at bedside  Level of care: Telemetry   Procedures:  2D echo  Microbiology  none  Antimicrobials: none    Objective: Vitals:   12/06/20 0404 12/06/20 0730 12/06/20 0900 12/06/20 1020  BP: (!) 118/57 (!) 132/55  (!) 121/55  Pulse: 70 72    Resp: 19 (!) 22 18   Temp: 98.6 F (37 C) 98.1 F (36.7 C)    TempSrc: Oral Oral    SpO2: 95% 94%    Weight:      Height:        Intake/Output Summary (Last 24 hours) at 12/06/2020 1059 Last data filed at 12/06/2020 0300 Gross per 24 hour  Intake 732.77 ml  Output 1100 ml  Net -367.23 ml    Filed Weights   11/30/20 1449  Weight: 79.5 kg    Examination:  Constitutional: No distress, in chair Respiratory: Clear to auscultation bilaterally Cardiovascular: Regular rate and rhythm this morning, no murmurs, no edema  Data Reviewed: I have independently reviewed following labs and imaging studies  CBC: Recent Labs  Lab 12/01/20 0322 12/02/20 0324 12/03/20 0425  WBC 12.9* 10.9* 10.7*  HGB 10.7* 10.7* 10.4*  HCT 31.7* 32.0* 31.3*  MCV 89.8 89.6 91.3  PLT 173 153 371    Basic Metabolic Panel: Recent Labs  Lab 12/01/20 0322 12/02/20 1159  NA 136 137  K 4.3 4.1  CL 106 106  CO2 26 24  GLUCOSE 164* 106*  BUN 16 14  CREATININE 0.86 0.77  CALCIUM 8.6* 8.2*    Liver Function Tests: Recent Labs  Lab 12/05/20 1341  AST 21  ALT 13  ALKPHOS 55  BILITOT 1.0  PROT 5.9*  ALBUMIN 2.7*   Coagulation Profile: No results for input(s): INR, PROTIME in the last 168 hours. HbA1C: No results for input(s): HGBA1C in  the last 72 hours. CBG: No results for input(s): GLUCAP in the last 168 hours.  Recent Results (from the past 240 hour(s))  SARS Coronavirus 2 (TAT 6-24 hrs)     Status: None   Collection Time: 11/28/20 12:00 AM  Result Value Ref Range Status   SARS Coronavirus 2 RESULT: NEGATIVE  Final    Comment: RESULT: NEGATIVESARS-CoV-2 INTERPRETATION:A NEGATIVE  test result means that SARS-CoV-2 RNA was not present in the specimen above the limit of detection of this test. This does not preclude a possible SARS-CoV-2 infection and should not be used as the  sole basis for patient management decisions. Negative results must be combined with clinical observations, patient history, and epidemiological information. Optimum specimen types and timing for peak viral levels during infections caused by SARS-CoV-2  have not been determined. Collection of multiple specimens or types of specimens may be necessary to detect virus. Improper specimen collection and handling, sequence variability under primers/probes, or organism present below the limit of detection may  lead to false negative results. Positive and negative predictive values of testing are highly dependent on prevalence. False negative test results are more likely when prevalence of disease is high.The expected result is NEGATIVE.Fact S heet for  Healthcare Providers: LocalChronicle.no Sheet for Patients: SalonLookup.es Reference Range - Negative       Radiology Studies: No results found.  Marzetta Board, MD, PhD [facility]  Between 7 am - 7 pm I am available, please contact me via Amion (for emergencies) or Securechat (non urgent messages)  Between 7 pm - 7 am I am not available, please contact night coverage MD/APP via Amion

## 2020-12-06 NOTE — Progress Notes (Signed)
Subjective: 6 Days Post-Op Procedure(s) (LRB): TOTAL KNEE ARTHROPLASTY (Left) Patient reports pain as 3 on 0-10 scale.   Denies CP or SOB.  Voiding without difficulty. Positive flatus. Doing better on the Tylenol. Objective: Vital signs in last 24 hours: Temp:  [98.1 F (36.7 C)-99 F (37.2 C)] 98.1 F (36.7 C) (10/04 0730) Pulse Rate:  [64-72] 72 (10/04 0730) Resp:  [13-22] 16 (10/04 1130) BP: (113-132)/(47-63) 122/58 (10/04 1130) SpO2:  [94 %-97 %] 94 % (10/04 0730) Reported by therapy to be adversely affected by his oxycodone.   Intake/Output from previous day: 10/03 0701 - 10/04 0700 In: 1141.7 [P.O.:360; I.V.:372.8; IV Piggyback:408.9] Out: 1100 [Urine:1100] Intake/Output this shift: Total I/O In: -  Out: 100 [Urine:100]  No results for input(s): HGB in the last 72 hours. No results for input(s): WBC, RBC, HCT, PLT in the last 72 hours. No results for input(s): NA, K, CL, CO2, BUN, CREATININE, GLUCOSE, CALCIUM in the last 72 hours. No results for input(s): LABPT, INR in the last 72 hours.  Neurologically intact Sensation intact distally Intact pulses distally Dorsiflexion/Plantar flexion intact Incision: scant drainage Compartment soft Moderate swelling in the thigh and the knee today.  Ecchymosis the buttocks.  No Homans' sign.  Assessment/Plan:  6 Days Post-Op Procedure(s) (LRB): TOTAL KNEE ARTHROPLASTY (Left) It appears from a cardiac standpoint he is improved.  He has been in normal sinus rhythm.  His blood pressures been fine. Patient has increased generalized edema in the left lower extremities. This is likely due to inactivity.  Patient apparently has been sleeping in his recliner chair which does not afford him the ability to elevate.  In addition he has been getting multiple fluid boluses. He has been relatively inactive. No evidence of DVT. Instructed to elevate the leg at least 30 minutes at a time 6 times a day.  He is asleep in the bed.  I discussed  this with his wife he and his therapist.  I will discontinue the oxycodone.  He may do better with Hydrocodone if needed. Advance diet Up with therapy Plan for discharge tomorrow   Active Problems:   S/P TKR (total knee replacement) using cement   PAF (paroxysmal atrial fibrillation) ([place])      Donald [name] Donald [name] 12/06/2020, @NOW 

## 2020-12-06 NOTE — Plan of Care (Signed)
  Problem: Activity: Goal: Ability to avoid complications of mobility impairment will improve Outcome: Completed/Met   Problem: Safety: Goal: Ability to remain free from injury will improve Outcome: Completed/Met

## 2020-12-06 NOTE — Plan of Care (Signed)
  Problem: Activity: Goal: Ability to avoid complications of mobility impairment will improve Outcome: Completed/Met

## 2020-12-06 NOTE — Care Management Important Message (Signed)
Important Message  Patient Details IM Letter given to the Patient. Name: Donald [name] MRN: [id number] Date of Birth: Dec 06, 1945   Medicare Important Message Given:  Yes     Kerin Salen 12/06/2020, 12:35 PM

## 2020-12-07 ENCOUNTER — Other Ambulatory Visit (HOSPITAL_COMMUNITY): Payer: Self-pay

## 2020-12-07 LAB — COMPREHENSIVE METABOLIC PANEL
ALT: 18 U/L (ref 0–44)
AST: 23 U/L (ref 15–41)
Albumin: 2.5 g/dL — ABNORMAL LOW (ref 3.5–5.0)
Alkaline Phosphatase: 65 U/L (ref 38–126)
Anion gap: 7 (ref 5–15)
BUN: 21 mg/dL (ref 8–23)
CO2: 27 mmol/L (ref 22–32)
Calcium: 8.5 mg/dL — ABNORMAL LOW (ref 8.9–10.3)
Chloride: 109 mmol/L (ref 98–111)
Creatinine, Ser: 0.9 mg/dL (ref 0.61–1.24)
GFR, Estimated: >60 mL/min (ref >60)
Glucose, Bld: 124 mg/dL — ABNORMAL HIGH (ref 70–99)
Potassium: 4.3 mmol/L (ref 3.5–5.1)
Sodium: 143 mmol/L (ref 135–145)
Total Bilirubin: 0.9 mg/dL (ref 0.3–1.2)
Total Protein: 5.8 g/dL — ABNORMAL LOW (ref 6.5–8.1)

## 2020-12-07 LAB — CBC WITH DIFFERENTIAL/PLATELET
Abs Immature Granulocytes: 0.05 10*3/uL (ref 0.00–0.07)
Basophils Absolute: 0 10*3/uL (ref 0.0–0.1)
Basophils Relative: 0 %
Eosinophils Absolute: 0.2 10*3/uL (ref 0.0–0.5)
Eosinophils Relative: 2 %
HCT: 26.3 % — ABNORMAL LOW (ref 39.0–52.0)
Hemoglobin: 8.9 g/dL — ABNORMAL LOW (ref 13.0–17.0)
Immature Granulocytes: 1 %
Lymphocytes Relative: 12 %
Lymphs Abs: 1.2 10*3/uL (ref 0.7–4.0)
MCH: 30.3 pg (ref 26.0–34.0)
MCHC: 33.8 g/dL (ref 30.0–36.0)
MCV: 89.5 fL (ref 80.0–100.0)
Monocytes Absolute: 0.9 10*3/uL (ref 0.1–1.0)
Monocytes Relative: 9 %
Neutro Abs: 7.6 10*3/uL (ref 1.7–7.7)
Neutrophils Relative %: 76 %
Platelets: 279 10*3/uL (ref 150–400)
RBC: 2.94 MIL/uL — ABNORMAL LOW (ref 4.22–5.81)
RDW: 13.6 % (ref 11.5–15.5)
WBC: 9.9 10*3/uL (ref 4.0–10.5)
nRBC: 0 % (ref 0.0–0.2)

## 2020-12-07 LAB — PHOSPHORUS: Phosphorus: 3.6 mg/dL (ref 2.5–4.6)

## 2020-12-07 LAB — MAGNESIUM: Magnesium: 1.8 mg/dL (ref 1.7–2.4)

## 2020-12-07 MED ORDER — SODIUM CHLORIDE 0.9 % IV BOLUS
250.0000 mL | Freq: Once | INTRAVENOUS | Status: AC
  Administered 2020-12-07: 250 mL via INTRAVENOUS

## 2020-12-07 MED ORDER — APIXABAN 5 MG PO TABS: 5.0000 mg | ORAL_TABLET | Freq: Two times a day (BID) | ORAL | 0 refills | Status: DC

## 2020-12-07 MED ORDER — SODIUM CHLORIDE 0.9 % IV SOLN: INTRAVENOUS | Status: DC

## 2020-12-07 MED ORDER — RISAQUAD PO CAPS: 1.0000 | ORAL_CAPSULE | Freq: Every day | ORAL | 0 refills | Status: DC

## 2020-12-07 MED ORDER — AMIODARONE HCL 400 MG PO TABS: ORAL_TABLET | ORAL | 0 refills | Status: DC

## 2020-12-07 MED ORDER — ONDANSETRON HCL 4 MG PO TABS: 4.0000 mg | ORAL_TABLET | Freq: Four times a day (QID) | ORAL | 0 refills | Status: DC | PRN

## 2020-12-07 MED ORDER — ACETAMINOPHEN 325 MG PO TABS: 325.0000 mg | ORAL_TABLET | Freq: Four times a day (QID) | ORAL | 0 refills | Status: DC | PRN

## 2020-12-07 MED ORDER — HYDROCODONE-ACETAMINOPHEN 5-325 MG PO TABS: 1.0000 | ORAL_TABLET | ORAL | 0 refills | Status: DC | PRN

## 2020-12-07 NOTE — Progress Notes (Addendum)
Physical Therapy Treatment Patient Details Name: Donald [name] MRN: [id number] DOB: Dec 15, 1945 Today's Date: 12/07/2020   History of Present Illness Pt is a 75 y.o. male s/p L TKA on 11/30/20. PMH significant for CAD s/p CABG x5 , MI, OA,  RTCR (2004; 2007).    PT Comments    POD # 7 pm session 3 min BP are still dropping with pt symptomatic dizzy and instability. RN present during session to assist and also took MANUAL BP's.  82/54 with amb 10 feet Pt did NOT meet Therapy goals to safely D/C to home today.  Spouse very nervous and does not feel comfortable taking him home today. RN paged MD to cancel Discharge  Recommendations for follow up therapy are one component of a multi-disciplinary discharge planning process, led by the attending physician.  Recommendations may be updated based on patient status, additional functional criteria and insurance authorization.  Follow Up Recommendations  Home health PT     Equipment Recommendations  None recommended by PT    Recommendations for Other Services       Precautions / Restrictions Precautions Precautions: Fall Precaution Comments: instructed no pillow under knee and to elevate to decrease swelling Restrictions Weight Bearing Restrictions: No Other Position/Activity Restrictions: WBAT     Mobility  Bed Mobility Overal bed mobility: Needs Assistance Bed Mobility: Sit to Supine     Supine to sit: Supervision;HOB elevated     General bed mobility comments: pt self able using his belt to get L LE up onto bed with one VC    Transfers Overall transfer level: Needs assistance Equipment used: Rolling walker (2 wheeled) Transfers: Sit to/from [facility] Sit to Stand: Supervision Stand pivot transfers: Supervision;Min guard       General transfer comment: increased self ability to rise with slight vs moderate instability.  Progressing  Ambulation/Gait   Gait Distance (Feet): 22 Feet Assistive device:  Rolling walker (2 wheeled)   Gait velocity: decreased   General Gait Details: limited amb distance due to hypotensive dizziness and gait instability. Drunken with lateral sway.   RN also assisted asshe took BP's MANUALLY  3 min BP 82/54   Stairs Stairs: Yes Stairs assistance: Max assist Stair Management: One rail Left;Step to pattern;Forwards Number of Stairs: 2 General stair comments: Near fall required MAX Assist - spouse present and nervous   Wheelchair Mobility    Modified Rankin (Stroke Patients Only)       Balance                                            Cognition Arousal/Alertness: Awake/alert Behavior During Therapy: WFL for tasks assessed/performed Overall Cognitive Status: Within Functional Limits for tasks assessed                                 General Comments: slowly improving cognition and level od alertness now that he is no longer taking OXY.  Increased ability to recall and retain.      Exercises  Total Knee Replacement TE's following HEP handout 10 reps B LE ankle pumps 05 reps towel squeezes 05 reps knee presses 05 reps heel slides  05 reps SAQ's 05 reps SLR's 05 reps ABD Educated on use of gait belt to assist with TE's Followed by ICE  General Comments        Pertinent Vitals/Pain Pain Assessment: 0-10 Pain Score: 3  Pain Descriptors / Indicators: Aching;Sore;Grimacing;Guarding;Operative site guarding Pain Intervention(s): Monitored during session;Premedicated before session;Repositioned;Ice applied    Home Living                      Prior Function            PT Goals (current goals can now be found in the care plan section) Progress towards PT goals: Progressing toward goals    Frequency    7X/week      PT Plan Current plan remains appropriate    Co-evaluation              AM-PAC PT "6 Clicks" Mobility   Outcome Measure  Help needed turning from your back to  your side while in a flat bed without using bedrails?: A Little Help needed moving from lying on your back to sitting on the side of a flat bed without using bedrails?: A Little Help needed moving to and from a bed to a chair (including a wheelchair)?: A Little Help needed standing up from a chair using your arms (e.g., wheelchair or bedside chair)?: A Little Help needed to walk in hospital room?: A Little Help needed climbing 3-5 steps with a railing? : A Little 6 Click Score: 18    End of Session Equipment Utilized During Treatment: Gait belt Activity Tolerance: Patient tolerated treatment well Patient left: in bed Nurse Communication: Mobility status PT Visit Diagnosis: Unsteadiness on feet (R26.81);Muscle weakness (generalized) (M62.81);Pain Pain - Right/Left: Left Pain - part of body: Knee     Time: 1330-1410 PT Time Calculation (min) (ACUTE ONLY): 40 min  Charges:  $Gait Training: 23-37 mins $Therapeutic Exercise: 8-22 mins $Therapeutic Activity: 8-22 mins                     {Gladyce Mcray  PTA Acute  Rehabilitation Services Pager      [phone] Office      [phone]

## 2020-12-07 NOTE — Progress Notes (Signed)
Subjective: 7 Days Post-Op Procedure(s) (LRB): TOTAL KNEE ARTHROPLASTY (Left) Patient reports pain as moderate.    Objective: Vital signs in last 24 hours: Temp:  [98.8 F (37.1 C)-99.4 F (37.4 C)] 98.8 F (37.1 C) (10/05 0539) Pulse Rate:  [61-69] 61 (10/05 0539) Resp:  [16-24] 20 (10/05 0655) BP: (109-131)/(44-86) 109/54 (10/05 0539) SpO2:  [99 %] 99 % (10/05 0539)  Intake/Output from previous day: 10/04 0701 - 10/05 0700 In: 377.9 [P.O.:120; I.V.:257.9] Out: 1055 [Urine:1055] Intake/Output this shift: Total I/O In: -  Out: 150 [Urine:150]  No results for input(s): HGB in the last 72 hours. No results for input(s): WBC, RBC, HCT, PLT in the last 72 hours. No results for input(s): NA, K, CL, CO2, BUN, CREATININE, GLUCOSE, CALCIUM in the last 72 hours. No results for input(s): LABPT, INR in the last 72 hours.  Neurologically intact ABD soft Neurovascular intact Sensation intact distally Intact pulses distally Dorsiflexion/Plantar flexion intact Incision: dressing C/D/I and no drainage No cellulitis present Compartment soft No sign of DVT   Assessment/Plan: 7 Days Post-Op Procedure(s) (LRB): TOTAL KNEE ARTHROPLASTY (Left) Advance diet Up with therapy D/C IV fluids Plan D/C home today Stable from cardiac standpoint per notes yesterday Follow up accordingly in AFib clinic Outpt PT arranged Follow up with Dr Tonita Cong in 1 week  Cecilie Kicks 12/07/2020, 12:48 PM

## 2020-12-07 NOTE — Progress Notes (Signed)
PROGRESS NOTE    Donald [name]  NAT:[id number] DOB: [birth year] DOA: 11/30/2020 PCP: Ma Hillock, DO   Brief Narrative:  The patient is a 75 year old overweight Caucasian male with a past medical history significant for but not limited to CAD status post CABG, hyperlipidemia as well as other comorbidities who came in for elective knee replacement and underwent a total knee replacement on 11/22/2020.  He had an episode of dizziness and hypotension on 12/02/2020 and was found to be in A. fib with RVR and the hospitalist team was consulted.  He now is having some presyncope and is initially placed on a diltiazem drip at 12/02/2020 and converted to sinus rhythm and he was placed on oral diltiazem however he flipped in and out of atrial fibrillation.  Cardiology was subsequently consulted and he was placed on IV amiodarone on 12/05/2020.  He has had further work-up per cardiology and he had a 2D echo was fairly unremarkable and they recommended placing patient on Eliquis and changing the patient to p.o. amiodarone 400 mg p.o. twice daily for 10 days and then 200 mg p.o. daily.  This morning he was a little orthostatic given that he received diltiazem which is now been discontinued.  He received two 250 mL fluid boluses.  If he is not orthostatic he can safely be discharged home and follow-up with PCP and cardiology outpatient setting as well as orthopedic surgery within 1 to 2 weeks.  Assessment & Plan:   Active Problems:   S/P TKR (total knee replacement) using cement   PAF (paroxysmal atrial fibrillation) (HCC)  Hypotension and presyncope in the setting of new onset atrial fibrillation with RVR -Initially placed on a diltiazem drip on 12/02/2020 and converted to sinus rhythm but then he was placed on oral diltiazem and he felt in and out of A. fib. -Diltiazem p.o. was increased to 180 mg p.o. daily given his breakthrough atrial fibrillation with ambulation but he became hypotensive again and considering  his fluctuation of the blood pressure cardiology transition and IV amiodarone infusion he converted normal sinus rhythm -Cardiology recommends starting p.o. amiodarone 400 g twice daily as his infusion is now stopped and recommending twice daily dosing for 10 days and then transitioning to p.o. amiodarone 200 mg daily -Cardiology also recommends continuing Eliquis is renally dosed and recommending follow-up in the clinic for A. fib at discharge -Patient does have a history of atrial flutter status post ablation in 2011  Hypertension -Has been hypotensive and has taken midodrine in the past 2.5 mg p.o. 3 times daily as needed for dizziness -Continue to monitor blood pressures per protocol -Last blood pressure reading was 100/48  Hyperlipidemia -C/w Atorvastatin 40 mg nightly and will need to follow-up with cardiology outpatient setting  History of CAD status post CABG in 2001 -His aspirin has now been changed to Eliquis given his A. fib and he will need to follow-up with cardiology outpatient setting -C/w Statin at D/C   Orthostatic Hypotension -In the setting of diltiazem administration -Have discontinued diltiazem now and patient received 2 250 mL boluses -If he is no longer orthostatic can likely safely be discharged home and follow-up with PCP and cardiology in outpatient setting as well as orthopedic surgery -C/w TED Hose -Orthopedic surgery has minimizes narcotics   DVT prophylaxis: (Anticoagulated with Eliquis Code Status: FULL CODE  Family Communication: No family present at bedside Disposition Plan: Per primary team orthopedic surgery planning on discharging patient  Status is: Inpatient  Remains inpatient appropriate  because:Unsafe d/c plan, IV treatments appropriate due to intensity of illness or inability to take PO, and Inpatient level of care appropriate due to severity of illness  Dispo: The patient is from: Home              Anticipated d/c is to: Home               Patient currently is medically stable to d/c if not Orthostatic    Difficult to place patient No   Consultants:  Cardiology [facility] Orthopedic Surgery (Primary)  Procedures: ECHOCARDIOGRAM IMPRESSIONS     1. Left ventricular ejection fraction, by estimation, is 70 to 75%. The  left ventricle has hyperdynamic function. The left ventricle has no  regional wall motion abnormalities. Left ventricular diastolic function  could not be evaluated.   2. Right ventricular systolic function is normal. The right ventricular  size is normal.   3. The mitral valve is grossly normal. No evidence of mitral valve  regurgitation.   4. The aortic valve is tricuspid. Aortic valve regurgitation is not  visualized. No aortic stenosis is present.   5. The inferior vena cava is normal in size with <50% respiratory  variability, suggesting right atrial pressure of 8 mmHg.   Comparison(s): Changes from prior study are noted. 01/01/2020: LVEF  55-60%, normal atrial size.   FINDINGS   Left Ventricle: Left ventricular ejection fraction, by estimation, is 70  to 75%. The left ventricle has hyperdynamic function. The left ventricle  has no regional wall motion abnormalities. The left ventricular internal  cavity size was normal in size.  There is borderline left ventricular hypertrophy. Left ventricular  diastolic function could not be evaluated due to atrial fibrillation. Left  ventricular diastolic function could not be evaluated.   Right Ventricle: The right ventricular size is normal. No increase in  right ventricular wall thickness. Right ventricular systolic function is  normal.   Left Atrium: Left atrial size was normal in size.   Right Atrium: Right atrial size was normal in size.   Pericardium: There is no evidence of pericardial effusion.   Mitral Valve: The mitral valve is grossly normal. No evidence of mitral  valve regurgitation.   Tricuspid Valve: The tricuspid valve is grossly normal.  Tricuspid valve  regurgitation is trivial.   Aortic Valve: The aortic valve is tricuspid. Aortic valve regurgitation is  not visualized. No aortic stenosis is present.   Pulmonic Valve: The pulmonic valve was grossly normal. Pulmonic valve  regurgitation is not visualized.   Aorta: The aortic root and ascending aorta are structurally normal, with  no evidence of dilitation.   Venous: The inferior vena cava is normal in size with less than 50%  respiratory variability, suggesting right atrial pressure of 8 mmHg.   IAS/Shunts: No atrial level shunt detected by color flow Doppler.   EKG: Rhythm strip during this exam demostrated AFib wtih RVR.   Antimicrobials:  Anti-infectives (From admission, onward)    Start     Dose/Rate Route Frequency Ordered Stop   11/30/20 1500  ceFAZolin (ANCEF) IVPB 2g/100 mL premix        2 g 200 mL/hr over 30 Minutes Intravenous Every 6 hours 11/30/20 1418 11/30/20 2136   11/30/20 0600  ceFAZolin (ANCEF) IVPB 2g/100 mL premix        2 g 200 mL/hr over 30 Minutes Intravenous On call to O.R. 11/30/20 0533 11/30/20 0735        Subjective: Seen and examined at bedside  and was dizziness morning but then repeat orthostatics done he is not as dizzy but still slightly.  No chest pain or shortness of breath.  Denies any other concerns or complaints at this time and hopeful to go home soon.  Objective: Vitals:   12/07/20 0539 12/07/20 0655 12/07/20 1100 12/07/20 1309  BP: (!) 109/54  (!) 118/51 (!) 100/48  Pulse: 61     Resp: 20 20 17    Temp: 98.8 F (37.1 C)     TempSrc: Oral     SpO2: 99%     Weight:      Height:        Intake/Output Summary (Last 24 hours) at 12/07/2020 1319 Last data filed at 12/07/2020 0930 Gross per 24 hour  Intake 377.89 ml  Output 755 ml  Net -377.11 ml   Filed Weights   11/30/20 1449  Weight: 79.5 kg   Examination: Physical Exam:  Constitutional: WN/WD overweight Caucasian male currently no acute distress appears  calm sitting in the chair bedside Eyes: Lids and conjunctivae normal, sclerae anicteric  ENMT: External Ears, Nose appear normal. Grossly normal hearing. Mucous membranes are moist. Neck: Appears normal, supple, no cervical masses, normal ROM, no appreciable thyromegaly; no JVD Respiratory: Diminished to auscultation bilaterally with coarse breath sounds, no wheezing, rales, rhonchi or crackles. Normal respiratory effort and patient is not tachypenic. No accessory muscle use.  Cardiovascular: RRR, no murmurs / rubs / gallops. S1 and S2 auscultated.  Has 1+ lower extremity edema Abdomen: Soft, non-tender, distended secondary body habitus. Bowel sounds positive.  GU: Deferred. Musculoskeletal: No clubbing / cyanosis of digits/nails. No joint deformity upper and lower extremities.  Skin: No rashes, lesions, ulcers on a limited skin evaluation. No induration; Warm and dry.  Neurologic: CN 2-12 grossly intact with no focal deficits. Romberg sign cerebellar and reflexes not assessed.  Psychiatric: Normal judgment and insight. Alert and oriented x 3. Normal mood and appropriate affect.   Data Reviewed: I have personally reviewed following labs and imaging studies  CBC: Recent Labs  Lab 12/01/20 0322 12/02/20 0324 12/03/20 0425  WBC 12.9* 10.9* 10.7*  HGB 10.7* 10.7* 10.4*  HCT 31.7* 32.0* 31.3*  MCV 89.8 89.6 91.3  PLT 173 153 720   Basic Metabolic Panel: Recent Labs  Lab 12/01/20 0322 12/02/20 1159  NA 136 137  K 4.3 4.1  CL 106 106  CO2 26 24  GLUCOSE 164* 106*  BUN 16 14  CREATININE 0.86 0.77  CALCIUM 8.6* 8.2*   GFR: Estimated Creatinine Clearance: 75.1 mL/min (by C-G formula based on SCr of 0.77 mg/dL). Liver Function Tests: Recent Labs  Lab 12/05/20 1341  AST 21  ALT 13  ALKPHOS 55  BILITOT 1.0  PROT 5.9*  ALBUMIN 2.7*   No results for input(s): LIPASE, AMYLASE in the last 168 hours. No results for input(s): AMMONIA in the last 168 hours. Coagulation  Profile: No results for input(s): INR, PROTIME in the last 168 hours. Cardiac Enzymes: No results for input(s): CKTOTAL, CKMB, CKMBINDEX, TROPONINI in the last 168 hours. BNP (last 3 results) No results for input(s): PROBNP in the last 8760 hours. HbA1C: No results for input(s): HGBA1C in the last 72 hours. CBG: No results for input(s): GLUCAP in the last 168 hours. Lipid Profile: No results for input(s): CHOL, HDL, LDLCALC, TRIG, CHOLHDL, LDLDIRECT in the last 72 hours. Thyroid Function Tests: Recent Labs    12/05/20 1341  TSH 1.118   Anemia Panel: No results for input(s): VITAMINB12, FOLATE,  FERRITIN, TIBC, IRON, RETICCTPCT in the last 72 hours. Sepsis Labs: No results for input(s): PROCALCITON, LATICACIDVEN in the last 168 hours.  Recent Results (from the past 240 hour(s))  SARS Coronavirus 2 (TAT 6-24 hrs)     Status: None   Collection Time: 11/28/20 12:00 AM  Result Value Ref Range Status   SARS Coronavirus 2 RESULT: NEGATIVE  Final    Comment: RESULT: NEGATIVESARS-CoV-2 INTERPRETATION:A NEGATIVE  test result means that SARS-CoV-2 RNA was not present in the specimen above the limit of detection of this test. This does not preclude a possible SARS-CoV-2 infection and should not be used as the  sole basis for patient management decisions. Negative results must be combined with clinical observations, patient history, and epidemiological information. Optimum specimen types and timing for peak viral levels during infections caused by SARS-CoV-2  have not been determined. Collection of multiple specimens or types of specimens may be necessary to detect virus. Improper specimen collection and handling, sequence variability under primers/probes, or organism present below the limit of detection may  lead to false negative results. Positive and negative predictive values of testing are highly dependent on prevalence. False negative test results are more likely when prevalence of disease is  high.The expected result is NEGATIVE.Fact S heet for  Healthcare Providers: [email or web address].no Sheet for Patients: [email or web address].es Reference Range - Negative     RN Pressure Injury Documentation:     Estimated body mass index is 29.17 kg/m as calculated from the following:   Height as of this encounter: 5\' 5"  (1.651 m).   Weight as of this encounter: 79.5 kg.  Malnutrition Type:   Malnutrition Characteristics:   Nutrition Interventions:    Radiology Studies: No results found.  Scheduled Meds:  acidophilus  1 capsule Oral Daily   amiodarone  400 mg Oral BID   apixaban  5 mg Oral BID   atorvastatin  40 mg Oral QHS   docusate sodium  100 mg Oral BID   multivitamin with minerals   Oral Daily   Continuous Infusions:  methocarbamol (ROBAXIN) IV 500 mg (11/30/20 1033)   sodium chloride      LOS: 5 days    Kerney Elbe, DO [facility] PAGER is on [place]  If 7PM-7AM, please contact night-coverage [email or web address]

## 2020-12-07 NOTE — Progress Notes (Signed)
Physical Therapy Treatment Patient Details Name: Donald [name] MRN: [id number] DOB: 05-23-1945 Today's Date: 12/07/2020   History of Present Illness Pt is a 75 y.o. male s/p L TKA on 11/30/20. PMH significant for CAD s/p CABG x5 , MI, OA,  RTCR (2004; 2007).    PT Comments    POD # 7 am session General Comments: slowly improving cognition and level od alertness now that he is no longer taking OXY.  Increased ability to recall and retain. Assisted to EOB.  General bed mobility comments: pt self able using his belt to get L LE up onto bed with one VC.  Assisted to standing, pt started to c/o dizziness.  Pt swaying and eyes shifting. EOB         BP 117/55 Standing  BP 90/66 Unable to tolerate 3 min upright BP Not able to amb, assisted to recliner.  Partially reclined b LE elevated BP 140/58 pt "feels better" Performed some TKR TE's followed by ICE. Pt will need another PT session to tolerate amb in hallway and do stairs in order to D/C to home safely with spouse.   Recommendations for follow up therapy are one component of a multi-disciplinary discharge planning process, led by the attending physician.  Recommendations may be updated based on patient status, additional functional criteria and insurance authorization.  Follow Up Recommendations  Home health PT     Equipment Recommendations  None recommended by PT    Recommendations for Other Services       Precautions / Restrictions Precautions Precautions: Fall Precaution Comments: instructed no pillow under knee and to elevate to decrease swelling Restrictions Weight Bearing Restrictions: No Other Position/Activity Restrictions: WBAT     Mobility  Bed Mobility Overal bed mobility: Needs Assistance Bed Mobility: Supine to Sit     Supine to sit: Supervision;HOB elevated     General bed mobility comments: pt self able using his belt to get L LE up onto bed with one VC    Transfers Overall transfer level: Needs  assistance Equipment used: Rolling walker (2 wheeled) Transfers: Sit to/from [facility] Sit to Stand: Supervision Stand pivot transfers: Supervision;Min guard       General transfer comment: increased self ability to rise with slight vs moderate instability.  Progressing  Ambulation/Gait             General Gait Details: unable to amb this session due to drop in BP 90/66 with Mod c/o dizziness   Stairs             Wheelchair Mobility    Modified Rankin (Stroke Patients Only)       Balance                                            Cognition Arousal/Alertness: Awake/alert Behavior During Therapy: WFL for tasks assessed/performed Overall Cognitive Status: Within Functional Limits for tasks assessed                                 General Comments: slowly improving cognition and level od alertness now that he is no longer taking OXY.  Increased ability to recall and retain.      Exercises  Total Knee Replacement TE's following HEP handout 10 reps B LE ankle pumps 05 reps towel squeezes 05 reps  knee presses 05 reps heel slides  05 reps SAQ's 05 reps SLR's 05 reps ABD Educated on use of gait belt to assist with TE's Followed by ICE     General Comments        Pertinent Vitals/Pain Pain Assessment: 0-10 Pain Score: 3  Pain Descriptors / Indicators: Aching;Sore;Grimacing;Guarding;Operative site guarding Pain Intervention(s): Monitored during session;Premedicated before session;Repositioned;Ice applied    Home Living                      Prior Function            PT Goals (current goals can now be found in the care plan section) Progress towards PT goals: Progressing toward goals    Frequency    7X/week      PT Plan Current plan remains appropriate    Co-evaluation              AM-PAC PT "6 Clicks" Mobility   Outcome Measure  Help needed turning from your back to your  side while in a flat bed without using bedrails?: A Little Help needed moving from lying on your back to sitting on the side of a flat bed without using bedrails?: A Little Help needed moving to and from a bed to a chair (including a wheelchair)?: A Little Help needed standing up from a chair using your arms (e.g., wheelchair or bedside chair)?: A Little Help needed to walk in hospital room?: A Little Help needed climbing 3-5 steps with a railing? : A Little 6 Click Score: 18    End of Session Equipment Utilized During Treatment: Gait belt Activity Tolerance: Patient tolerated treatment well Patient left: in chair;with chair alarm set;with call bell/phone within reach Nurse Communication: Mobility status PT Visit Diagnosis: Unsteadiness on feet (R26.81);Muscle weakness (generalized) (M62.81);Pain Pain - Right/Left: Left Pain - part of body: Knee     Time: 4580-9983 PT Time Calculation (min) (ACUTE ONLY): 33 min  Charges:  $Therapeutic Exercise: 8-22 mins $Therapeutic Activity: 8-22 mins                     {Amauri Medellin  PTA Acute  Rehabilitation Services Pager      [phone] Office      [phone]

## 2020-12-07 NOTE — Discharge Summary (Signed)
Physician Discharge Summary   Patient ID: HOKE BAER MRN: [id number] DOB/AGE: 1945/03/10 75 y.o.  Admit date: 11/30/2020 Discharge date: 12/07/2020  Primary Diagnosis: left knee primary osteoarthritis  Admission Diagnoses:  Past Medical History:  Diagnosis Date   Age-related nuclear cataract, bilateral 06/29/2020   Allergic rhinitis    BPH (benign prostatic hyperplasia)    Chicken pox    Coronary artery disease    a. MI 2003 with CABG.   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    Osteoarthritis of knee 05/13/2017   Renal cell carcinoma ([place]) 02/04/2019   Partial nephrectomyKIDNEY, LEFT, PARTIAL NEPHRECTOMY:    Right ureteral stone    S/P ablation of atrial flutter    followed by dr Rayann Heman (EP cardiolgoist)  ablation 04-17-2004  and by dr Rayann Heman 02-10- 2011 was successful   S/P CABG x 5 08-05-2001   in [place] , [place]   LIMA to LAD,  seqVG to Diagonal,  SeqVG to OM,  seqVG to PDA and PLA   Seasonal allergies    Sinus bradycardia    Wears glasses    Discharge Diagnoses:   Active Problems:   S/P TKR (total knee replacement) using cement   PAF (paroxysmal atrial fibrillation) (HCC)  Estimated body mass index is 29.17 kg/m as calculated from the following:   Height as of this encounter: 5\' 5"  (1.651 m).   Weight as of this encounter: 79.5 kg.  Procedure:  Procedure(s) (LRB): TOTAL KNEE ARTHROPLASTY (Left)   Consults: cardiology and hospitalists  HPI: see H&P Laboratory Data: Admission on 11/30/2020  Component Date Value Ref Range Status   WBC 12/01/2020 12.9 (A) 4.0 - 10.5 K/uL Final   RBC 12/01/2020 3.53 (A) 4.22 - 5.81 MIL/uL Final   Hemoglobin 12/01/2020 10.7 (A) 13.0 - 17.0 g/dL Final   HCT  12/01/2020 31.7 (A) 39.0 - 52.0 % Final   MCV 12/01/2020 89.8  80.0 - 100.0 fL Final   MCH 12/01/2020 30.3  26.0 - 34.0 pg Final   MCHC 12/01/2020 33.8  30.0 - 36.0 g/dL Final   RDW 12/01/2020 13.4  11.5 - 15.5 % Final   Platelets 12/01/2020 173  150 - 400 K/uL Final   nRBC 12/01/2020 0.0  0.0 - 0.2 % Final   Performed at [facility], [place] [address], [place], [place] [address]   Sodium 12/01/2020 136  135 - 145 mmol/L Final   Potassium 12/01/2020 4.3  3.5 - 5.1 mmol/L Final   Chloride 12/01/2020 106  98 - 111 mmol/L Final   CO2 12/01/2020 26  22 - 32 mmol/L Final   Glucose, Bld 12/01/2020 164 (A) 70 - 99 mg/dL Final   Glucose reference range applies only to samples taken after fasting for at least 8 hours.   BUN 12/01/2020 16  8 - 23 mg/dL Final   Creatinine, Ser 12/01/2020 0.86  0.61 - 1.24 mg/dL Final   Calcium 12/01/2020 8.6 (A) 8.9 - 10.3 mg/dL Final   GFR, Estimated 12/01/2020 >60  >60 mL/min Final   Comment: (NOTE) Calculated using the CKD-EPI Creatinine Equation (2021)    Anion gap 12/01/2020 4 (A) 5 - 15 Final  Performed at [facility], [place] [address] [place], [place] [address]   WBC 12/02/2020 10.9 (A) 4.0 - 10.5 K/uL Final   RBC 12/02/2020 3.57 (A) 4.22 - 5.81 MIL/uL Final   Hemoglobin 12/02/2020 10.7 (A) 13.0 - 17.0 g/dL Final   HCT 12/02/2020 32.0 (A) 39.0 - 52.0 % Final   MCV 12/02/2020 89.6  80.0 - 100.0 fL Final   MCH 12/02/2020 30.0  26.0 - 34.0 pg Final   MCHC 12/02/2020 33.4  30.0 - 36.0 g/dL Final   RDW 12/02/2020 13.6  11.5 - 15.5 % Final   Platelets 12/02/2020 153  150 - 400 K/uL Final   nRBC 12/02/2020 0.0  0.0 - 0.2 % Final   Performed at [facility], [place] [address] [place], [place] [address]   Sodium 12/02/2020 137  135 - 145 mmol/L Final   Potassium 12/02/2020 4.1  3.5 - 5.1 mmol/L Final   Chloride 12/02/2020 106  98 - 111 mmol/L Final   CO2 12/02/2020 24  22 - 32 mmol/L Final   Glucose, Bld  12/02/2020 106 (A) 70 - 99 mg/dL Final   Glucose reference range applies only to samples taken after fasting for at least 8 hours.   BUN 12/02/2020 14  8 - 23 mg/dL Final   Creatinine, Ser 12/02/2020 0.77  0.61 - 1.24 mg/dL Final   Calcium 12/02/2020 8.2 (A) 8.9 - 10.3 mg/dL Final   GFR, Estimated 12/02/2020 >60  >60 mL/min Final   Comment: (NOTE) Calculated using the CKD-EPI Creatinine Equation (2021)    Anion gap 12/02/2020 7  5 - 15 Final   Performed at [facility], [place] [address], [place], [place] [address]   Troponin I (High Sensitivity) 12/02/2020 117 (A) <18 ng/L Final   Comment: CRITICAL RESULT CALLED TO, READ BACK BY AND VERIFIED WITH: BHANDARI,K. RN @1453  ON 12/02/2020 BY COHEN,K (NOTE) Elevated high sensitivity troponin I (hsTnI) values and significant  changes across serial measurements may suggest ACS but many other  chronic and acute conditions are known to elevate hsTnI results.  Refer to the Links section for chest pain algorithms and additional  guidance. Performed at [facility], [place] [address] [place], [place] [address]    Troponin I (High Sensitivity) 12/02/2020 180 (A) <18 ng/L Final   Comment: DELTA CHECK NOTED CRITICAL RESULT CALLED TO, READ BACK BY AND VERIFIED WITH: BHANDARI,N. RN @1621  ON 12/02/2020 BY COHEN,K (NOTE) Elevated high sensitivity troponin I (hsTnI) values and significant  changes across serial measurements may suggest ACS but many other  chronic and acute conditions are known to elevate hsTnI results.  Refer to the Links section for chest pain algorithms and additional  guidance. Performed at [facility], [place] [address], [place], [place] [address]    Weight 12/02/2020 8,588.50  oz Final   Height 12/02/2020 65  in Final   BP 12/02/2020 111/56  mmHg Final   WBC 12/03/2020 10.7 (A) 4.0 - 10.5 K/uL Final   RBC 12/03/2020 3.43 (A) 4.22 - 5.81 MIL/uL Final   Hemoglobin 12/03/2020  10.4 (A) 13.0 - 17.0 g/dL Final   HCT 12/03/2020 31.3 (A) 39.0 - 52.0 % Final   MCV 12/03/2020 91.3  80.0 - 100.0 fL Final   MCH 12/03/2020 30.3  26.0 - 34.0 pg Final   MCHC 12/03/2020 33.2  30.0 - 36.0 g/dL Final   RDW 12/03/2020 13.9  11.5 - 15.5 % Final   Platelets 12/03/2020 166  150 - 400 K/uL Final   nRBC 12/03/2020 0.0  0.0 - 0.2 % Final   Performed at [facility], [place] [address] [place], [place] [address]   TSH 12/05/2020 1.118  0.350 - 4.500 uIU/mL Final   Comment: Performed by a 3rd Generation assay with a functional sensitivity of <=0.01 uIU/mL. Performed at [facility], [place] [address] [place], [place] [address]    Total Protein 12/05/2020 5.9 (A) 6.5 - 8.1 g/dL Final   Albumin 12/05/2020 2.7 (A) 3.5 - 5.0 g/dL Final   AST 12/05/2020 21  15 - 41 U/L Final   ALT 12/05/2020 13  0 - 44 U/L Final   Alkaline Phosphatase 12/05/2020 55  38 - 126 U/L Final   Total Bilirubin 12/05/2020 1.0  0.3 - 1.2 mg/dL Final   Bilirubin, Direct 12/05/2020 0.2  0.0 - 0.2 mg/dL Final   Indirect Bilirubin 12/05/2020 0.8  0.3 - 0.9 mg/dL Final   Performed at [facility], [place] [address] [place], [place] [address]  Orders Only on 11/28/2020  Component Date Value Ref Range Status   SARS Coronavirus 2 11/28/2020 RESULT: NEGATIVE   Final   Comment: RESULT: NEGATIVESARS-CoV-2 INTERPRETATION:A NEGATIVE  test result means that SARS-CoV-2 RNA was not present in the specimen above the limit of detection of this test. This does not preclude a possible SARS-CoV-2 infection and should not be used as the  sole basis for patient management decisions. Negative results must be combined with clinical observations, patient history, and epidemiological information. Optimum specimen types and timing for peak viral levels during infections caused by SARS-CoV-2  have not been determined. Collection of multiple specimens or types of specimens may be necessary to  detect virus. Improper specimen collection and handling, sequence variability under primers/probes, or organism present below the limit of detection may  lead to false negative results. Positive and negative predictive values of testing are highly dependent on prevalence. False negative test results are more likely when prevalence of disease is high.The expected result is NEGATIVE.Fact S                          heet for  Healthcare Providers: LocalChronicle.no Sheet for Patients: SalonLookup.es Reference Range - Negative   Hospital Outpatient Visit on 11/22/2020  Component Date Value Ref Range Status   MRSA, PCR 11/22/2020 NEGATIVE  NEGATIVE Final   Staphylococcus aureus 11/22/2020 NEGATIVE  NEGATIVE Final   Comment: (NOTE) The Xpert SA Assay (FDA approved for NASAL specimens in patients 48 years of age and older), is one component of a comprehensive surveillance program. It is not intended to diagnose infection nor to guide or monitor treatment. Performed at [facility], [place] [address] [place], [place] [address]    WBC 11/22/2020 5.8  4.0 - 10.5 K/uL Final   RBC 11/22/2020 4.39  4.22 - 5.81 MIL/uL Final   Hemoglobin 11/22/2020 13.2  13.0 - 17.0 g/dL Final   HCT 11/22/2020 40.1  39.0 - 52.0 % Final   MCV 11/22/2020 91.3  80.0 - 100.0 fL Final   MCH 11/22/2020 30.1  26.0 - 34.0 pg Final   MCHC 11/22/2020 32.9  30.0 - 36.0 g/dL Final   RDW 11/22/2020 13.1  11.5 - 15.5 % Final   Platelets 11/22/2020 218  150 - 400 K/uL Final   nRBC 11/22/2020 0.0  0.0 - 0.2 % Final   Performed at [facility], [address] [place], [place] [address]   Sodium 11/22/2020 144  135 - 145 mmol/L Final   Potassium 11/22/2020 4.3  3.5 - 5.1 mmol/L Final   Chloride 11/22/2020 109  98 - 111 mmol/L Final   CO2 11/22/2020 27  22 - 32 mmol/L Final   Glucose, Bld 11/22/2020 98  70 - 99 mg/dL Final   Glucose  reference range applies only to samples taken after fasting for at least 8 hours.   BUN 11/22/2020 22  8 - 23 mg/dL Final   Creatinine, Ser 11/22/2020 0.88  0.61 - 1.24 mg/dL Final   Calcium 11/22/2020 9.6  8.9 - 10.3 mg/dL Final   GFR, Estimated 11/22/2020 >60  >60 mL/min Final   Comment: (NOTE) Calculated using the CKD-EPI Creatinine Equation (2021)    Anion gap 11/22/2020 8  5 - 15 Final   Performed at [facility], [place] [address], [place], [place] [address]   aPTT 11/22/2020 28  24 - 36 seconds Final   Performed at [facility], [place] [address], [place], [place] [address]   Prothrombin Time 11/22/2020 13.1  11.4 - 15.2 seconds Final   INR 11/22/2020 1.0  0.8 - 1.2 Final   Comment: (NOTE) INR goal varies based on device and disease states. Performed at [facility], [place] [address], [place], [place] [address]    Color, Urine 11/22/2020 YELLOW  YELLOW Final   APPearance 11/22/2020 CLEAR  CLEAR Final   Specific Gravity, Urine 11/22/2020 1.024  1.005 - 1.030 Final   pH 11/22/2020 6.0  5.0 - 8.0 Final   Glucose, UA 11/22/2020 NEGATIVE  NEGATIVE mg/dL Final   Hgb urine dipstick 11/22/2020 NEGATIVE  NEGATIVE Final   Bilirubin Urine 11/22/2020 NEGATIVE  NEGATIVE Final   Ketones, ur 11/22/2020 NEGATIVE  NEGATIVE mg/dL Final   Protein, ur 11/22/2020 NEGATIVE  NEGATIVE mg/dL Final   Nitrite 11/22/2020 NEGATIVE  NEGATIVE Final   Leukocytes,Ua 11/22/2020 NEGATIVE  NEGATIVE Final   RBC / HPF 11/22/2020 0-5  0 - 5 RBC/hpf Final   WBC, UA 11/22/2020 0-5  0 - 5 WBC/hpf Final   Bacteria, UA 11/22/2020 NONE SEEN  NONE SEEN Final   Mucus 11/22/2020 PRESENT   Final   Performed at [facility], [place] [address], [place], [place] [address]     X-Rays:DG Knee 1-2 Views Left  Result Date: 11/30/2020 CLINICAL DATA:  Post left knee replacement EXAM: LEFT KNEE - 1-2 VIEW COMPARISON:  None. FINDINGS: Changes of left knee  replacement. Soft tissue and joint space gas. No hardware bony complicating feature. IMPRESSION: Left knee replacement.  No visible complicating feature. Electronically Signed   By: Rolm Baptise M.D.   On: 11/30/2020 13:20   ECHOCARDIOGRAM LIMITED  Result Date: 12/02/2020    ECHOCARDIOGRAM REPORT   Patient Name:   DEMONTAY GRANTHAM Date of Exam: 12/02/2020 Medical Rec #:  [id number]     Height:       65.0 in Accession #:    [id number]    Weight:       175.3 lb Date of Birth:  [birth year]      BSA:          1.870 m Patient Age:    21 years      BP:           111/56 mmHg Patient Gender: M             HR:  130 bpm. Exam Location:  Inpatient Procedure: Limited Echo and Color Doppler Indications:    I48.91* Unspecified atrial fibrillation  History:        Patient has prior history of Echocardiogram examinations, most                 recent 01/01/2020. Prior CABG, Arrythmias:Atrial Fibrillation;                 Risk Factors:Dyslipidemia.  Sonographer:    Raquel Sarna Senior RDCS Referring Phys: [id number] [place]  1. Left ventricular ejection fraction, by estimation, is 70 to 75%. The left ventricle has hyperdynamic function. The left ventricle has no regional wall motion abnormalities. Left ventricular diastolic function could not be evaluated.  2. Right ventricular systolic function is normal. The right ventricular size is normal.  3. The mitral valve is grossly normal. No evidence of mitral valve regurgitation.  4. The aortic valve is tricuspid. Aortic valve regurgitation is not visualized. No aortic stenosis is present.  5. The inferior vena cava is normal in size with <50% respiratory variability, suggesting right atrial pressure of 8 mmHg. Comparison(s): Changes from prior study are noted. 01/01/2020: LVEF 55-60%, normal atrial size. FINDINGS  Left Ventricle: Left ventricular ejection fraction, by estimation, is 70 to 75%. The left ventricle has hyperdynamic function. The left ventricle has no regional  wall motion abnormalities. The left ventricular internal cavity size was normal in size. There is borderline left ventricular hypertrophy. Left ventricular diastolic function could not be evaluated due to atrial fibrillation. Left ventricular diastolic function could not be evaluated. Right Ventricle: The right ventricular size is normal. No increase in right ventricular wall thickness. Right ventricular systolic function is normal. Left Atrium: Left atrial size was normal in size. Right Atrium: Right atrial size was normal in size. Pericardium: There is no evidence of pericardial effusion. Mitral Valve: The mitral valve is grossly normal. No evidence of mitral valve regurgitation. Tricuspid Valve: The tricuspid valve is grossly normal. Tricuspid valve regurgitation is trivial. Aortic Valve: The aortic valve is tricuspid. Aortic valve regurgitation is not visualized. No aortic stenosis is present. Pulmonic Valve: The pulmonic valve was grossly normal. Pulmonic valve regurgitation is not visualized. Aorta: The aortic root and ascending aorta are structurally normal, with no evidence of dilitation. Venous: The inferior vena cava is normal in size with less than 50% respiratory variability, suggesting right atrial pressure of 8 mmHg. IAS/Shunts: No atrial level shunt detected by color flow Doppler. EKG: Rhythm strip during this exam demostrated AFib wtih RVR. Lyman Bishop MD Electronically signed by Lyman Bishop MD Signature Date/Time: 12/02/2020/3:56:58 PM    Final     EKG: Orders placed or performed during the hospital encounter of 11/30/20   EKG 12-Lead   EKG 12-Lead   EKG 12-Lead   EKG 12-Lead   EKG 12-Lead   EKG 12-Lead     Hospital Course: IKEY OMARY is a 75 y.o. who was admitted to [facility]. They were brought to the operating room on 11/30/2020 and underwent Procedure(s): TOTAL KNEE ARTHROPLASTY.  Patient tolerated the procedure well and was later transferred to the recovery room and  then to the orthopaedic floor for postoperative care.  They were given PO and IV analgesics for pain control following their surgery.  They were given 24 hours of postoperative antibiotics of  Anti-infectives (From admission, onward)    Start     Dose/Rate Route Frequency Ordered Stop   11/30/20 1500  ceFAZolin (ANCEF)  IVPB 2g/100 mL premix        2 g 200 mL/hr over 30 Minutes Intravenous Every 6 hours 11/30/20 1418 11/30/20 2136   11/30/20 0600  ceFAZolin (ANCEF) IVPB 2g/100 mL premix        2 g 200 mL/hr over 30 Minutes Intravenous On call to O.R. 11/30/20 0533 11/30/20 0735      and started on DVT prophylaxis in the form of Aspirin, TED hose, and SCDs .   PT and OT were ordered for total joint protocol.  Discharge planning consulted to help with postop disposition and equipment needs.  Patient had a fair night on the evening of surgery.  They started to get up OOB with therapy on day one, had several hypotensive episodes.  Continued to attempt work with therapy into day two but was again hypotensive plus tachycardic.  Hospitalists were consulted, STAT EKG revealed AFib, cardiology was consulted. ASA was DC/d and he was placed on Eliquis. Rate was able to be controlled by medications. Had to discontinue oxycodone due to hypotension. By day seven, the patient had progressed with therapy and meeting their goals.  Incision was healing well.  Patient was seen in rounds and was ready to go home.   Diet: Regular diet Activity:WBAT Follow-up:in 7 days Disposition - Home, outpt PT Discharged Condition: good   Discharge Instructions     Call MD / Call 911   Complete by: As directed    If you experience chest pain or shortness of breath, CALL 911 and be transported to the hospital emergency room.  If you develope a fever above 101 F, pus (white drainage) or increased drainage or redness at the wound, or calf pain, call your surgeon's office.   Constipation Prevention   Complete by: As directed     Drink plenty of fluids.  Prune juice may be helpful.  You may use a stool softener, such as Colace (over the counter) 100 mg twice a day.  Use MiraLax (over the counter) for constipation as needed.   Diet - low sodium heart healthy   Complete by: As directed    Increase activity slowly as tolerated   Complete by: As directed    Post-operative opioid taper instructions:   Complete by: As directed    POST-OPERATIVE OPIOID TAPER INSTRUCTIONS: It is important to wean off of your opioid medication as soon as possible. If you do not need pain medication after your surgery it is ok to stop day one. Opioids include: Codeine, Hydrocodone(Norco, Vicodin), Oxycodone(Percocet, oxycontin) and hydromorphone amongst others.  Long term and even short term use of opiods can cause: Increased pain response Dependence Constipation Depression Respiratory depression And more.  Withdrawal symptoms can include Flu like symptoms Nausea, vomiting And more Techniques to manage these symptoms Hydrate well Eat regular healthy meals Stay active Use relaxation techniques(deep breathing, meditating, yoga) Do Not substitute Alcohol to help with tapering If you have been on opioids for less than two weeks and do not have pain than it is ok to stop all together.  Plan to wean off of opioids This plan should start within one week post op of your joint replacement. Maintain the same interval or time between taking each dose and first decrease the dose.  Cut the total daily intake of opioids by one tablet each day Next start to increase the time between doses. The last dose that should be eliminated is the evening dose.         Allergies as  of 12/07/2020   No Known Allergies      Medication List     STOP taking these medications    aspirin 81 MG chewable tablet   atorvastatin 40 MG tablet Commonly known as: LIPITOR   Fish Oil 1000 MG Caps   ibuprofen 800 MG tablet Commonly known as: ADVIL    Red Yeast Rice 600 MG Caps       TAKE these medications    acetaminophen 325 MG tablet Commonly known as: TYLENOL Take 1-2 tablets (325-650 mg total) by mouth every 6 (six) hours as needed for mild pain (pain score 1-3 or temp > 100.5).   acidophilus Caps capsule Take 1 capsule by mouth daily. Start taking on: December 08, 2020   amiodarone 400 MG tablet Commonly known as: PACERONE Take 1 tablet (400 mg total) by mouth 2 (two) times daily for 10 days, THEN 0.5 tablets (200 mg total) daily for 20 days. Start taking on: December 07, 2020   apixaban 5 MG Tabs tablet Commonly known as: ELIQUIS Take 1 tablet (5 mg total) by mouth 2 (two) times daily.   docusate sodium 100 MG capsule Commonly known as: Colace Take 1 capsule (100 mg total) by mouth 2 (two) times daily as needed for mild constipation.   HYDROcodone-acetaminophen 5-325 MG tablet Commonly known as: NORCO/VICODIN Take 1 tablet by mouth every 4 (four) hours as needed for moderate pain or severe pain.   midodrine 2.5 MG tablet Commonly known as: PROAMATINE Take 1 tablet (2.5 mg total) by mouth 3 (three) times daily with meals as needed.   MULTIVITAMIN ADULT PO Take 1 tablet by mouth daily.   ondansetron 4 MG tablet Commonly known as: ZOFRAN Take 1 tablet (4 mg total) by mouth every 6 (six) hours as needed for nausea.   polyethylene glycol 17 g packet Commonly known as: MIRALAX / GLYCOLAX Take 17 g by mouth daily.        Follow-up Information     Susa Day, MD. Go on 12/14/2020.   Specialty: Orthopedic Surgery Why: You are scheduled for a follow up appointment on 12-14-20 at 2:15 pm. Contact information: [address] [place] [address] [place] Follow up.   Specialty: Cardiology Why: The atrial fib clinic will call you to arrange a follow-up visit. Contact information: [address] [id number] [place] [phone]                Signed: Lacie Draft, PA-C Orthopaedic Surgery 12/07/2020, 12:52 PM

## 2020-12-07 NOTE — Progress Notes (Signed)
Staff message has been sent to cardiology scheduler to arrange A. fib clinic follow-up in 4 to 6 weeks at the recommendation of Dr. Phineas Inches.

## 2020-12-07 NOTE — Progress Notes (Addendum)
Progress Note  Patient Name: Donald [name] Date of Encounter: 12/07/2020  [facility] Cardiologist: Larae Grooms, MD   Subjective   Donald [name] is doing well today. He is still mildly dizzy have he got diltiazem today. His blood pressure perked up after he got some fluids. He was working with PT  Inpatient Medications    Scheduled Meds:  acidophilus  1 capsule Oral Daily   amiodarone  400 mg Oral BID   apixaban  5 mg Oral BID   atorvastatin  40 mg Oral QHS   docusate sodium  100 mg Oral BID   multivitamin with minerals   Oral Daily   Continuous Infusions:  methocarbamol (ROBAXIN) IV 500 mg (11/30/20 1033)   PRN Meds: acetaminophen, alum & mag hydroxide-simeth, bisacodyl, diphenhydrAMINE, HYDROcodone-acetaminophen, menthol-cetylpyridinium **OR** phenol, methocarbamol **OR** methocarbamol (ROBAXIN) IV, metoCLOPramide **OR** metoCLOPramide (REGLAN) injection, midodrine, ondansetron **OR** ondansetron (ZOFRAN) IV, polyethylene glycol   Vital Signs    Vitals:   12/07/20 0539 12/07/20 0655 12/07/20 1100 12/07/20 1309  BP: (!) 109/54  (!) 118/51 (!) 100/48  Pulse: 61     Resp: 20 20 17    Temp: 98.8 F (37.1 C)     TempSrc: Oral     SpO2: 99%     Weight:      Height:            Intake/Output Summary (Last 24 hours) at 12/07/2020 1418 Last data filed at 12/07/2020 0930 Gross per 24 hour  Intake 377.89 ml  Output 755 ml  Net -377.11 ml   Last 3 Weights 11/30/2020 11/22/2020 11/01/2020  Weight (lbs) 175 lb 4.3 oz 156 lb 6.4 oz 159 lb  Weight (kg) 79.5 kg 70.943 kg 72.122 kg      Telemetry    Sinus rhythm with PACs - Personally Reviewed  ECG     12/05/2020: Sinus rhythm with PACs HR 73 bpm- Personally Reviewed  Physical Exam   Vitals:   12/07/20 1100 12/07/20 1309  BP: (!) 118/51 (!) 100/48  Pulse:    Resp: 17   Temp:    SpO2:       Physical Exam Neuro: alert and oriented CV: r,r,r no murmurs Vasc: 2+ radial pulses Pulm: CLAB Abd: non  distended Ext: No LE edema Skin: warm and well perfused Psych: normal mood  Labs    High Sensitivity Troponin:   Recent Labs  Lab 12/02/20 1339 12/02/20 1527  TROPONINIHS 117* 180*     Chemistry Recent Labs  Lab 12/01/20 0322 12/02/20 1159 12/05/20 1341  NA 136 137  --   K 4.3 4.1  --   CL 106 106  --   CO2 26 24  --   GLUCOSE 164* 106*  --   BUN 16 14  --   CREATININE 0.86 0.77  --   CALCIUM 8.6* 8.2*  --   PROT  --   --  5.9*  ALBUMIN  --   --  2.7*  AST  --   --  21  ALT  --   --  13  ALKPHOS  --   --  55  BILITOT  --   --  1.0  GFRNONAA >60 >60  --   ANIONGAP 4* 7  --     Lipids No results for input(s): CHOL, TRIG, HDL, LABVLDL, LDLCALC, CHOLHDL in the last 168 hours.  Hematology Recent Labs  Lab 12/01/20 0322 12/02/20 0324 12/03/20 0425  WBC 12.9* 10.9* 10.7*  RBC 3.53* 3.57*  3.43*  HGB 10.7* 10.7* 10.4*  HCT 31.7* 32.0* 31.3*  MCV 89.8 89.6 91.3  MCH 30.3 30.0 30.3  MCHC 33.8 33.4 33.2  RDW 13.4 13.6 13.9  PLT 173 153 166   Thyroid  Recent Labs  Lab 12/05/20 1341  TSH 1.118    BNPNo results for input(s): BNP, PROBNP in the last 168 hours.  DDimer No results for input(s): DDIMER in the last 168 hours.   Radiology    No results found.  Cardiac Studies  Echo 12/02/20  IMPRESSIONS     1. Left ventricular ejection fraction, by estimation, is 70 to 75%. The  left ventricle has hyperdynamic function. The left ventricle has no  regional wall motion abnormalities. Left ventricular diastolic function  could not be evaluated.   2. Right ventricular systolic function is normal. The right ventricular  size is normal.   3. The mitral valve is grossly normal. No evidence of mitral valve  regurgitation.   4. The aortic valve is tricuspid. Aortic valve regurgitation is not  visualized. No aortic stenosis is present.   5. The inferior vena cava is normal in size with <50% respiratory  variability, suggesting right atrial pressure of 8 mmHg.    Comparison(s): Changes from prior study are noted. 01/01/2020: LVEF  55-60%, normal atrial size.   FINDINGS   Left Ventricle: Left ventricular ejection fraction, by estimation, is 70  to 75%. The left ventricle has hyperdynamic function. The left ventricle  has no regional wall motion abnormalities. The left ventricular internal  cavity size was normal in size.  There is borderline left ventricular hypertrophy. Left ventricular  diastolic function could not be evaluated due to atrial fibrillation. Left  ventricular diastolic function could not be evaluated.   Right Ventricle: The right ventricular size is normal. No increase in  right ventricular wall thickness. Right ventricular systolic function is  normal.   Left Atrium: Left atrial size was normal in size.   Right Atrium: Right atrial size was normal in size.   Pericardium: There is no evidence of pericardial effusion.   Mitral Valve: The mitral valve is grossly normal. No evidence of mitral  valve regurgitation.  Patient Profile     75 y.o. male with a with a PMH of CAD s/p CABG in 2001, atrial flutter s/p ablation in 2011 no longer on anticoagulation, hypertension, hyperlipidemia, renal cancer s/p left nephrectomy 02/2019 being followed by cards for atrial fib post for lt total knee.      Assessment & Plan    PAF developed post op this admit and placed on eliquis and dilt. [name].  Hx of a flutter ablation. He had  break through a fib with ambulation given extra dilt and his CD dose was increased.  10/2-10/3 was hypotension with increase of dilt to 180 mg. Considering his fluctuations with blood pressure, we transitioned him to amiodraone IV infusion and he has converted -->continue eliquis 5 mg BID -->continue amiodarone 400 mg BID --> can continue amiodarone 400 mg BID for 10 days with a plan to transition to amiodarone 200 mg daily -->continue atorvastatin 40 mg daily -->Would recommend  atrial fibrillation clinic for follow-up we can arrange --> Cardiology will sign off, thank you for involving [place] in your patient's care    For questions or updates, please contact [facility] Please consult [email or web address] for contact info under        Signed, Janina Mayo, MD  12/07/2020, 2:18 PM

## 2020-12-07 NOTE — Progress Notes (Signed)
Despite midodrine and bolus, BP continues to drop with standing and ambulation 83/48.  Pt symptomatic with dizziness.  Hospitalist and ortho made aware - DC cancelled.  Abd binder and bolus ordered.

## 2020-12-08 DIAGNOSIS — I951 Orthostatic hypotension: Secondary | ICD-10-CM

## 2020-12-08 MED ORDER — SODIUM CHLORIDE 0.9 % IV BOLUS: 500.0000 mL | Freq: Once | INTRAVENOUS | Status: DC

## 2020-12-08 MED ORDER — MAGNESIUM SULFATE 2 GM/50ML IV SOLN
2.0000 g | Freq: Once | INTRAVENOUS | Status: AC
  Administered 2020-12-08: 2 g via INTRAVENOUS
  Filled 2020-12-08: qty 50

## 2020-12-08 MED ORDER — SODIUM CHLORIDE 0.9 % IV BOLUS
1000.0000 mL | Freq: Once | INTRAVENOUS | Status: AC
  Administered 2020-12-08: 1000 mL via INTRAVENOUS

## 2020-12-08 MED ORDER — MIDODRINE HCL 5 MG PO TABS
5.0000 mg | ORAL_TABLET | Freq: Three times a day (TID) | ORAL | Status: DC
  Administered 2020-12-09 – 2020-12-10 (×5): 5 mg via ORAL
  Filled 2020-12-08 (×5): qty 1

## 2020-12-08 NOTE — Progress Notes (Signed)
Physical Therapy Treatment Patient Details Name: Donald [name] MRN: [id number] DOB: 12/23/1945 Today's Date: 12/08/2020   History of Present Illness Pt is a 75 y.o. male s/p L TKA on 11/30/20. PMH significant for CAD s/p CABG x5 , MI, OA,  RTCR (2004; 2007).    PT Comments    POD # 8 Assisted OOB to amb to bathroom.  Extended time in bathroom for attempted BM and wash up.  Pt feels "fine" when seated but when upright still c/o dizziness.   Performed TKR TE's following HEP handout.  Knee flex approx 85 degrees. Pt was unable to amb in hallway.  Will return later today.    Recommendations for follow up therapy are one component of a multi-disciplinary discharge planning process, led by the attending physician.  Recommendations may be updated based on patient status, additional functional criteria and insurance authorization.  Follow Up Recommendations  Home health PT     Equipment Recommendations  None recommended by PT    Recommendations for Other Services       Precautions / Restrictions Precautions Precautions: Fall Precaution Comments: instructed no pillow under knee and to elevate to decrease swelling Restrictions Weight Bearing Restrictions: No LLE Weight Bearing: Weight bearing as tolerated     Mobility  Bed Mobility Overal bed mobility: Needs Assistance Bed Mobility: Supine to Sit     Supine to sit: Supervision;HOB elevated     General bed mobility comments: pt self able using his belt to get L LE    Transfers Overall transfer level: Needs assistance Equipment used: Rolling walker (2 wheeled) Transfers: Sit to/from [facility] Sit to Stand: Supervision Stand pivot transfers: Supervision       General transfer comment: increased self ability to rise with slight vs moderate instability.  Progressing well.  also assisted with a toilet transfer.  Ambulation/Gait Ambulation/Gait assistance: Supervision Gait Distance (Feet): 22  Feet Assistive device: Rolling walker (2 wheeled) Gait Pattern/deviations: Step-to pattern;Decreased stride length;Decreased weight shift to left;Decreased stance time - left Gait velocity: decreased   General Gait Details: assisted with amb to and from bathroom only as BP dropped and pt feeling diizy.   Stairs             Wheelchair Mobility    Modified Rankin (Stroke Patients Only)       Balance                                            Cognition Arousal/Alertness: Awake/alert Behavior During Therapy: WFL for tasks assessed/performed Overall Cognitive Status: Within Functional Limits for tasks assessed                                 General Comments: appears mentally 100%      Exercises      General Comments        Pertinent Vitals/Pain Pain Assessment: 0-10 Pain Score: 2  Pain Location: Rt knee Pain Descriptors / Indicators: Discomfort;Operative site guarding Pain Intervention(s): Monitored during session;Premedicated before session;Repositioned;Ice applied    Home Living                      Prior Function            PT Goals (current goals can now be found in the care plan  section) Progress towards PT goals: Progressing toward goals    Frequency    7X/week      PT Plan Current plan remains appropriate    Co-evaluation              AM-PAC PT "6 Clicks" Mobility   Outcome Measure  Help needed turning from your back to your side while in a flat bed without using bedrails?: A Little Help needed moving from lying on your back to sitting on the side of a flat bed without using bedrails?: A Little Help needed moving to and from a bed to a chair (including a wheelchair)?: A Little Help needed standing up from a chair using your arms (e.g., wheelchair or bedside chair)?: A Little Help needed to walk in hospital room?: A Little Help needed climbing 3-5 steps with a railing? : A Little 6 Click  Score: 18    End of Session Equipment Utilized During Treatment: Gait belt Activity Tolerance: Patient tolerated treatment well Patient left: in chair;with call bell/phone within reach Nurse Communication: Mobility status PT Visit Diagnosis: Unsteadiness on feet (R26.81);Muscle weakness (generalized) (M62.81);Pain Pain - Right/Left: Left Pain - part of body: Knee     Time: 1000-1040 PT Time Calculation (min) (ACUTE ONLY): 40 min  Charges:  $Gait Training: 8-22 mins $Therapeutic Exercise: 8-22 mins $Therapeutic Activity: 8-22 mins                     Rica Koyanagi  PTA Acute  Rehabilitation Services Pager      [phone] Office      [phone]

## 2020-12-08 NOTE — Progress Notes (Signed)
PROGRESS NOTE    Donald [name]  KZL:[id number] DOB: 1945/04/11 DOA: 11/30/2020 PCP: Ma Hillock, DO   Brief Narrative:  The patient is a 75 year old overweight Caucasian male with a past medical history significant for but not limited to CAD status post CABG, hyperlipidemia as well as other comorbidities who came in for elective knee replacement and underwent a total knee replacement on 11/22/2020.  He had an episode of dizziness and hypotension on 12/02/2020 and was found to be in A. fib with RVR and the hospitalist team was consulted.  He now is having some presyncope and is initially placed on a diltiazem drip at 12/02/2020 and converted to sinus rhythm and he was placed on oral diltiazem however he flipped in and out of atrial fibrillation.  Cardiology was subsequently consulted and he was placed on IV amiodarone on 12/05/2020.  He has had further work-up per cardiology and he had a 2D echo was fairly unremarkable and they recommended placing patient on Eliquis and changing the patient to p.o. amiodarone 400 mg p.o. twice daily for 10 days and then 200 mg p.o. daily.    Yesterday morning he was a little orthostatic given that he received diltiazem which is now been discontinued.  He received two 250 mL fluid boluses and started on Maintenance IVF at that time. Will give a 1000 mL Fluid bolus today as he remains orthostatic. He now has an Abdominal Binder and TED hose  given his continued Orthostasis. If he is not orthostatic he can safely be discharged home and follow-up with PCP and cardiology outpatient setting as well as orthopedic surgery within 1 to 2 weeks.  Assessment & Plan:   Active Problems:   S/P TKR (total knee replacement) using cement   PAF (paroxysmal atrial fibrillation) (HCC)  Hypotension and presyncope in the setting of new onset atrial fibrillation with RVR -Initially placed on a diltiazem drip on 12/02/2020 and converted to sinus rhythm but then he was placed on oral diltiazem  and he felt in and out of A. fib. -Diltiazem p.o. was increased to 180 mg p.o. daily given his breakthrough atrial fibrillation with ambulation but he became hypotensive again and considering his fluctuation of the blood pressure cardiology transition and IV amiodarone infusion he converted normal sinus rhythm -Cardiology recommends starting p.o. amiodarone 400 g twice daily as his infusion is now stopped and recommending twice daily dosing for 10 days and then transitioning to p.o. amiodarone 200 mg daily -Cardiology also recommends continuing Eliquis is renally dosed and recommending follow-up in the clinic for A. fib at discharge -Patient does have a history of atrial flutter status post ablation in 2011  Hypertension -Has been hypotensive and has taken midodrine in the past 2.5 mg p.o. 3 times daily as needed for dizziness; See below -Continue to monitor blood pressures per protocol -Last blood pressure reading was 117/54  Hyperlipidemia -C/w Atorvastatin 40 mg nightly and will need to follow-up with cardiology outpatient setting  History of CAD status post CABG in 2001 -His aspirin has now been changed to Eliquis given his A. fib and he will need to follow-up with cardiology outpatient setting -C/w Statin at D/C   Orthostatic Hypotension -In the setting of diltiazem administration -Have discontinued diltiazem now and patient received 2 250 mL boluses and was placed on maintenance IV fluids at 75 mils per hour yesterday and will give a 1 Liter bolus today  -Repeat orthostatic vital signs today showed that BP is still dropping as it  went from 117/54 & HR:60 lying; 110/97 & HR:64 sitting; 88/53 & HR:82 standing for 3 minutes.  -If he is no longer orthostatic can likely safely be discharged home and follow-up with PCP and cardiology in outpatient setting as well as orthopedic surgery -C/w TED Hose and Abdominal Binder -Orthopedic surgery has minimizes narcotics -C/w PT/OT and repeat  Orthostatics in the AM    DVT prophylaxis: Anticoagulated with Eliquis Code Status: FULL CODE  Family Communication: No family present at bedside Disposition Plan: Per primary team orthopedic surgery planning on discharging patient once no longer Orthostatic   Status is: Inpatient  Remains inpatient appropriate because:Unsafe d/c plan, IV treatments appropriate due to intensity of illness or inability to take PO, and Inpatient level of care appropriate due to severity of illness  Dispo: The patient is from: Home              Anticipated d/c is to: Home              Patient currently is medically stable to d/c if not Orthostatic    Difficult to place patient No   Consultants:  Cardiology [facility] Orthopedic Surgery (Primary)  Procedures: ECHOCARDIOGRAM IMPRESSIONS     1. Left ventricular ejection fraction, by estimation, is 70 to 75%. The  left ventricle has hyperdynamic function. The left ventricle has no  regional wall motion abnormalities. Left ventricular diastolic function  could not be evaluated.   2. Right ventricular systolic function is normal. The right ventricular  size is normal.   3. The mitral valve is grossly normal. No evidence of mitral valve  regurgitation.   4. The aortic valve is tricuspid. Aortic valve regurgitation is not  visualized. No aortic stenosis is present.   5. The inferior vena cava is normal in size with <50% respiratory  variability, suggesting right atrial pressure of 8 mmHg.   Comparison(s): Changes from prior study are noted. 01/01/2020: LVEF  55-60%, normal atrial size.   FINDINGS   Left Ventricle: Left ventricular ejection fraction, by estimation, is 70  to 75%. The left ventricle has hyperdynamic function. The left ventricle  has no regional wall motion abnormalities. The left ventricular internal  cavity size was normal in size.  There is borderline left ventricular hypertrophy. Left ventricular  diastolic function could not be  evaluated due to atrial fibrillation. Left  ventricular diastolic function could not be evaluated.   Right Ventricle: The right ventricular size is normal. No increase in  right ventricular wall thickness. Right ventricular systolic function is  normal.   Left Atrium: Left atrial size was normal in size.   Right Atrium: Right atrial size was normal in size.   Pericardium: There is no evidence of pericardial effusion.   Mitral Valve: The mitral valve is grossly normal. No evidence of mitral  valve regurgitation.   Tricuspid Valve: The tricuspid valve is grossly normal. Tricuspid valve  regurgitation is trivial.   Aortic Valve: The aortic valve is tricuspid. Aortic valve regurgitation is  not visualized. No aortic stenosis is present.   Pulmonic Valve: The pulmonic valve was grossly normal. Pulmonic valve  regurgitation is not visualized.   Aorta: The aortic root and ascending aorta are structurally normal, with  no evidence of dilitation.   Venous: The inferior vena cava is normal in size with less than 50%  respiratory variability, suggesting right atrial pressure of 8 mmHg.   IAS/Shunts: No atrial level shunt detected by color flow Doppler.   EKG: Rhythm strip during  this exam demostrated AFib wtih RVR.   Antimicrobials:  Anti-infectives (From admission, onward)    Start     Dose/Rate Route Frequency Ordered Stop   11/30/20 1500  ceFAZolin (ANCEF) IVPB 2g/100 mL premix        2 g 200 mL/hr over 30 Minutes Intravenous Every 6 hours 11/30/20 1418 11/30/20 2136   11/30/20 0600  ceFAZolin (ANCEF) IVPB 2g/100 mL premix        2 g 200 mL/hr over 30 Minutes Intravenous On call to O.R. 11/30/20 0533 11/30/20 0735        Subjective: Seen and examined at bedside and was very frustrated and lost his IV access but refused to have any more IV placement I spoke with cardiology.  We will give another liter bolus today given that he continues to be orthostatic.  He denies any chest  pain but was dizzy.  Denies any other concerns or complaints at this time.  Objective: Vitals:   12/07/20 1709 12/07/20 2148 12/08/20 0432 12/08/20 1137  BP: (!) 112/44 (!) 111/53 (!) 106/48 (!) 117/54  Pulse: (!) 59 61 67 60  Resp: 17 18 17 16   Temp: 98.8 F (37.1 C) 98.9 F (37.2 C) 99 F (37.2 C) 98 F (36.7 C)  TempSrc: Oral Oral Oral Oral  SpO2: 95% 92% 92% 95%  Weight:      Height:        Intake/Output Summary (Last 24 hours) at 12/08/2020 1411 Last data filed at 12/08/2020 7654 Gross per 24 hour  Intake 1715.98 ml  Output 1075 ml  Net 640.98 ml    Filed Weights   11/30/20 1449  Weight: 79.5 kg   Examination: Physical Exam:  Constitutional: WN/WD overweight Caucasian male who appears frustrated Eyes: Lids and conjunctivae normal, sclerae anicteric  ENMT: External Ears, Nose appear normal. Grossly normal hearing. Mucous membranes are moist.  Neck: Appears normal, supple, no cervical masses, normal ROM, no appreciable thyromegaly, no appreciable JVD Respiratory: Diminished to auscultation bilaterally, no wheezing, rales, rhonchi or crackles. Normal respiratory effort and patient is not tachypenic. No accessory muscle use.  Unlabored breathing Cardiovascular: RRR, no murmurs / rubs / gallops. S1 and S2 auscultated.  Mild lower extremity edema Abdomen: Soft, non-tender, non-distended. Bowel sounds positive.  GU: Deferred. Musculoskeletal: No clubbing / cyanosis of digits/nails. No joint deformity upper and lower extremities.  Skin: No rashes, lesions, ulcers on limited skin evaluation. No induration; Warm and dry.  Neurologic: CN 2-12 grossly intact with no focal deficits. Romberg sign and cerebellar reflexes not assessed.  Psychiatric: Normal judgment and insight. Alert and oriented x 3.  Frustrated mood and appropriate affect.   Data Reviewed: I have personally reviewed following labs and imaging studies  CBC: Recent Labs  Lab 12/02/20 0324 12/03/20 0425  12/07/20 1617  WBC 10.9* 10.7* 9.9  NEUTROABS  --   --  7.6  HGB 10.7* 10.4* 8.9*  HCT 32.0* 31.3* 26.3*  MCV 89.6 91.3 89.5  PLT 153 166 650    Basic Metabolic Panel: Recent Labs  Lab 12/02/20 1159 12/07/20 1617  NA 137 143  K 4.1 4.3  CL 106 109  CO2 24 27  GLUCOSE 106* 124*  BUN 14 21  CREATININE 0.77 0.90  CALCIUM 8.2* 8.5*  MG  --  1.8  PHOS  --  3.6    GFR: Estimated Creatinine Clearance: 66.8 mL/min (by C-G formula based on SCr of 0.9 mg/dL). Liver Function Tests: Recent Labs  Lab 12/05/20 1341 12/07/20  1617  AST 21 23  ALT 13 18  ALKPHOS 55 65  BILITOT 1.0 0.9  PROT 5.9* 5.8*  ALBUMIN 2.7* 2.5*    No results for input(s): LIPASE, AMYLASE in the last 168 hours. No results for input(s): AMMONIA in the last 168 hours. Coagulation Profile: No results for input(s): INR, PROTIME in the last 168 hours. Cardiac Enzymes: No results for input(s): CKTOTAL, CKMB, CKMBINDEX, TROPONINI in the last 168 hours. BNP (last 3 results) No results for input(s): PROBNP in the last 8760 hours. HbA1C: No results for input(s): HGBA1C in the last 72 hours. CBG: No results for input(s): GLUCAP in the last 168 hours. Lipid Profile: No results for input(s): CHOL, HDL, LDLCALC, TRIG, CHOLHDL, LDLDIRECT in the last 72 hours. Thyroid Function Tests: No results for input(s): TSH, T4TOTAL, FREET4, T3FREE, THYROIDAB in the last 72 hours.  Anemia Panel: No results for input(s): VITAMINB12, FOLATE, FERRITIN, TIBC, IRON, RETICCTPCT in the last 72 hours. Sepsis Labs: No results for input(s): PROCALCITON, LATICACIDVEN in the last 168 hours.  No results found for this or any previous visit (from the past 240 hour(s)).   RN Pressure Injury Documentation:     Estimated body mass index is 29.17 kg/m as calculated from the following:   Height as of this encounter: 5\' 5"  (1.651 m).   Weight as of this encounter: 79.5 kg.  Malnutrition Type:   Malnutrition Characteristics:    Nutrition Interventions:    Radiology Studies: No results found.  Scheduled Meds:  acidophilus  1 capsule Oral Daily   amiodarone  400 mg Oral BID   apixaban  5 mg Oral BID   atorvastatin  40 mg Oral QHS   docusate sodium  100 mg Oral BID   multivitamin with minerals   Oral Daily   Continuous Infusions:  sodium chloride 75 mL/hr at 12/07/20 1853   magnesium sulfate bolus IVPB     methocarbamol (ROBAXIN) IV 500 mg (11/30/20 1033)   sodium chloride      LOS: 6 days    Kerney Elbe, DO [facility] PAGER is on [place]  If 7PM-7AM, please contact night-coverage [email or web address]

## 2020-12-08 NOTE — Progress Notes (Signed)
Subjective: 8 Days Post-Op Procedure(s) (LRB): TOTAL KNEE ARTHROPLASTY (Left) Patient reports pain as 2 on 0-10 scale.   Denies CP or SOB.  Voiding without difficulty. Positive flatus. Objective: Vital signs in last 24 hours: Temp:  [98 F (36.7 C)-99 F (37.2 C)] 98 F (36.7 C) (10/06 1137) Pulse Rate:  [59-67] 60 (10/06 1137) Resp:  [16-18] 16 (10/06 1137) BP: (83-117)/(44-54) 117/54 (10/06 1137) SpO2:  [92 %-95 %] 95 % (10/06 1137)  Intake/Output from previous day: 10/05 0701 - 10/06 0700 In: 1836 [P.O.:240; I.V.:846; IV Piggyback:750] Out: 525 [Urine:525] Intake/Output this shift: Total I/O In: 120 [P.O.:120] Out: 700 [Urine:700]  Recent Labs    12/07/20 1617  HGB 8.9*   Recent Labs    12/07/20 1617  WBC 9.9  RBC 2.94*  HCT 26.3*  PLT 279   Recent Labs    12/07/20 1617  NA 143  K 4.3  CL 109  CO2 27  BUN 21  CREATININE 0.90  GLUCOSE 124*  CALCIUM 8.5*   No results for input(s): LABPT, INR in the last 72 hours.  Neurologically intact Neurovascular intact Sensation intact distally Intact pulses distally Dorsiflexion/Plantar flexion intact Incision: scant drainage Moderate swelling  Assessment/Plan:  8 Days Post-Op Procedure(s) (LRB): TOTAL KNEE ARTHROPLASTY (Left) Ok from Ortho standpoint. No sign of actice bleeding Still though with orthostatics Advance diet Hgb in am  Active Problems:   S/P TKR (total knee replacement) using cement   PAF (paroxysmal atrial fibrillation) ([place])      Johnn Hai 12/08/2020, @[facility] 

## 2020-12-08 NOTE — Plan of Care (Signed)
Briefly stopped by to talk to Dr. Tommie Raymond about importance of fluids with orthostasis and agree with scheduling midodrine up to 10 mg TID

## 2020-12-08 NOTE — Progress Notes (Addendum)
Physical Therapy Treatment Patient Details Name: Donald [name] MRN: [id number] DOB: 05/21/1945 Today's Date: 12/08/2020   History of Present Illness Pt is a 75 y.o. male s/p L TKA on 11/30/20. PMH significant for CAD s/p CABG x5 , MI, OA,  RTCR (2004; 2007).    PT Comments    POD #8 pm session Pt feeling "better" but his R forearm is red and swollen with achy pain.  RN already aware.   Spouse present.  Assisted with amb a great distance in hallway.  General Gait Details: pt tolerated amb 155 feet in hallway at Supervision level while spouse followed with recliner as a percaution.  BP did drop 109/64 and pt felt "fuzzy" but continued to walk. Then returned to room to perform some TE's following HEP handout.  Instructed on proper tech, freq as well as use of ICE.   Pt hoped to D/C to home tomorrow.   Recommendations for follow up therapy are one component of a multi-disciplinary discharge planning process, led by the attending physician.  Recommendations may be updated based on patient status, additional functional criteria and insurance authorization.  Follow Up Recommendations  Home health PT     Equipment Recommendations  None recommended by PT    Recommendations for Other Services       Precautions / Restrictions Precautions Precautions: Fall Precaution Comments: instructed no pillow under knee and to elevate to decrease swelling Required Braces or Orthoses: Other Brace Other Brace: ABD binder Restrictions Weight Bearing Restrictions: No LLE Weight Bearing: Weight bearing as tolerated     Mobility  Bed Mobility Overal bed mobility: Needs Assistance Bed Mobility: Supine to Sit     Supine to sit: Supervision;HOB elevated     General bed mobility comments: OOB in recliner    Transfers Overall transfer level: Needs assistance Equipment used: Rolling walker (2 wheeled) Transfers: Sit to/from [facility] Sit to Stand: Supervision Stand pivot transfers:  Supervision       General transfer comment: self able to rise from recliner.  Ambulation/Gait Ambulation/Gait assistance: Supervision Gait Distance (Feet): 155 Feet Assistive device: Rolling walker (2 wheeled) Gait Pattern/deviations: Step-to pattern;Decreased stride length;Decreased weight shift to left;Decreased stance time - left Gait velocity: decreased   General Gait Details: pt tolerated amb 155 feet in hallway at Supervision level while spouse followed with recliner as a percaution.  BP did drop 109/64 and pt felt "fuzzy" but continued to walk.   Stairs             Wheelchair Mobility    Modified Rankin (Stroke Patients Only)       Balance                                            Cognition Arousal/Alertness: Awake/alert Behavior During Therapy: WFL for tasks assessed/performed Overall Cognitive Status: Within Functional Limits for tasks assessed                                 General Comments: appears mentally 100%      Exercises  Total Knee Replacement TE's following HEP handout 10 reps B LE ankle pumps 05 reps towel squeezes 05 reps knee presses 05 reps heel slides  05 reps SAQ's 05 reps SLR's 05 reps ABD Educated on use of gait belt to assist with  TE's Followed by ICE     General Comments        Pertinent Vitals/Pain Pain Assessment: 0-10 Pain Score: 2  Pain Location: Rt knee Pain Descriptors / Indicators: Discomfort;Operative site guarding Pain Intervention(s): Monitored during session;Premedicated before session;Repositioned;Ice applied    Home Living                      Prior Function            PT Goals (current goals can now be found in the care plan section) Progress towards PT goals: Progressing toward goals    Frequency    7X/week      PT Plan Current plan remains appropriate    Co-evaluation              AM-PAC PT "6 Clicks" Mobility   Outcome Measure  Help  needed turning from your back to your side while in a flat bed without using bedrails?: A Little Help needed moving from lying on your back to sitting on the side of a flat bed without using bedrails?: A Little Help needed moving to and from a bed to a chair (including a wheelchair)?: A Little Help needed standing up from a chair using your arms (e.g., wheelchair or bedside chair)?: A Little Help needed to walk in hospital room?: A Little Help needed climbing 3-5 steps with a railing? : A Little 6 Click Score: 18    End of Session Equipment Utilized During Treatment: Gait belt Activity Tolerance: Patient tolerated treatment well Patient left: in chair;with call bell/phone within reach Nurse Communication: Mobility status PT Visit Diagnosis: Unsteadiness on feet (R26.81);Muscle weakness (generalized) (M62.81);Pain Pain - Right/Left: Left Pain - part of body: Knee     Time: 1450-1532 PT Time Calculation (min) (ACUTE ONLY): 42 min  Charges:  $Gait Training: 23-37 mins $Therapeutic Activity: 8-22 mins                     Rica Koyanagi  PTA Acute  Rehabilitation Services Pager      [phone] Office      [phone]

## 2020-12-09 DIAGNOSIS — T801XXA Vascular complications following infusion, transfusion and therapeutic injection, initial encounter: Secondary | ICD-10-CM

## 2020-12-09 DIAGNOSIS — D649 Anemia, unspecified: Secondary | ICD-10-CM

## 2020-12-09 LAB — COMPREHENSIVE METABOLIC PANEL
ALT: 22 U/L (ref 0–44)
AST: 24 U/L (ref 15–41)
Albumin: 2.4 g/dL — ABNORMAL LOW (ref 3.5–5.0)
Alkaline Phosphatase: 60 U/L (ref 38–126)
Anion gap: 4 — ABNORMAL LOW (ref 5–15)
BUN: 16 mg/dL (ref 8–23)
CO2: 24 mmol/L (ref 22–32)
Calcium: 8.1 mg/dL — ABNORMAL LOW (ref 8.9–10.3)
Chloride: 113 mmol/L — ABNORMAL HIGH (ref 98–111)
Creatinine, Ser: 0.8 mg/dL (ref 0.61–1.24)
GFR, Estimated: >60 mL/min (ref >60)
Glucose, Bld: 114 mg/dL — ABNORMAL HIGH (ref 70–99)
Potassium: 3.9 mmol/L (ref 3.5–5.1)
Sodium: 141 mmol/L (ref 135–145)
Total Bilirubin: 0.9 mg/dL (ref 0.3–1.2)
Total Protein: 5.4 g/dL — ABNORMAL LOW (ref 6.5–8.1)

## 2020-12-09 LAB — CBC WITH DIFFERENTIAL/PLATELET
Abs Immature Granulocytes: 0.04 10*3/uL (ref 0.00–0.07)
Basophils Absolute: 0 10*3/uL (ref 0.0–0.1)
Basophils Relative: 0 %
Eosinophils Absolute: 0.3 10*3/uL (ref 0.0–0.5)
Eosinophils Relative: 4 %
HCT: 25.2 % — ABNORMAL LOW (ref 39.0–52.0)
Hemoglobin: 8.4 g/dL — ABNORMAL LOW (ref 13.0–17.0)
Immature Granulocytes: 1 %
Lymphocytes Relative: 10 %
Lymphs Abs: 0.7 10*3/uL (ref 0.7–4.0)
MCH: 29.8 pg (ref 26.0–34.0)
MCHC: 33.3 g/dL (ref 30.0–36.0)
MCV: 89.4 fL (ref 80.0–100.0)
Monocytes Absolute: 0.5 10*3/uL (ref 0.1–1.0)
Monocytes Relative: 7 %
Neutro Abs: 6.2 10*3/uL (ref 1.7–7.7)
Neutrophils Relative %: 78 %
Platelets: 315 10*3/uL (ref 150–400)
RBC: 2.82 MIL/uL — ABNORMAL LOW (ref 4.22–5.81)
RDW: 13.5 % (ref 11.5–15.5)
WBC: 7.8 10*3/uL (ref 4.0–10.5)
nRBC: 0 % (ref 0.0–0.2)

## 2020-12-09 LAB — PHOSPHORUS: Phosphorus: 3.1 mg/dL (ref 2.5–4.6)

## 2020-12-09 LAB — MAGNESIUM: Magnesium: 2 mg/dL (ref 1.7–2.4)

## 2020-12-09 MED ORDER — CEPHALEXIN 500 MG PO CAPS: 500.0000 mg | ORAL_CAPSULE | Freq: Four times a day (QID) | ORAL | 0 refills | Status: AC

## 2020-12-09 MED ORDER — SODIUM CHLORIDE 0.9 % IV BOLUS
1000.0000 mL | Freq: Once | INTRAVENOUS | Status: AC
  Administered 2020-12-09: 1000 mL via INTRAVENOUS

## 2020-12-09 MED ORDER — CEFTRIAXONE SODIUM 1 G IJ SOLR
1.0000 g | INTRAMUSCULAR | Status: DC
  Administered 2020-12-09: 1 g via INTRAMUSCULAR
  Filled 2020-12-09 (×2): qty 10

## 2020-12-09 NOTE — Care Management Important Message (Signed)
Important Message  Patient Details IM Letter given to the Patient. Name: BAYLIN GAMBLIN MRN: [id number] Date of Birth: May 20, 1945   Medicare Important Message Given:  Yes     Kerin Salen 12/09/2020, 11:08 AM

## 2020-12-09 NOTE — Progress Notes (Addendum)
Physical Therapy Treatment Patient Details Name: Donald [name] MRN: [id number] DOB: 1945-06-23 Today's Date: 12/09/2020   History of Present Illness Pt is a 75 y.o. male s/p L TKA on 11/30/20. PMH significant for CAD s/p CABG x5 , MI, OA,  RTCR (2004; 2007).    PT Comments    POD #9  Pt FINALLY feeling better and was able to amb 175 feet in hallway with spouse and perform stairs.   THANK YOU !!!   Dr Raiford Noble for final med adjustment to stabilize pt's BP's. Pt is safe for D/C to home today.   Recommendations for follow up therapy are one component of a multi-disciplinary discharge planning process, led by the attending physician.  Recommendations may be updated based on patient status, additional functional criteria and insurance authorization.  Follow Up Recommendations  Home health PT     Equipment Recommendations  None recommended by PT    Recommendations for Other Services       Precautions / Restrictions Precautions Precautions: Fall Precaution Comments: instructed no pillow under knee and to elevate to decrease swelling Required Braces or Orthoses: Other Brace Other Brace: ABD binder for OOB due to HYPOTENSION Restrictions Weight Bearing Restrictions: No LLE Weight Bearing: Weight bearing as tolerated     Mobility  Bed Mobility               General bed mobility comments: OOB in recliner    Transfers   Equipment used: Rolling walker (2 wheeled) Transfers: Sit to/from [facility] Sit to Stand: Supervision Stand pivot transfers: Supervision       General transfer comment: pt self able to rise from recliner with good use B UE's and safe turns.  Ambulation/Gait Ambulation/Gait assistance: Supervision Gait Distance (Feet): 175 Feet Assistive device: Rolling walker (2 wheeled) Gait Pattern/deviations: Step-to pattern;Decreased stride length;Decreased weight shift to left;Decreased stance time - left Gait velocity: decreased    General Gait Details: pt tolerated amb in hallway 175 feet with spouse "hand on" tolerated well with NO c/o dizziness.  Much improved stability and balance.   Stairs Stairs: Yes Stairs assistance: Supervision;Min guard Stair Management: One rail Left;Step to pattern;Forwards Number of Stairs: 2 General stair comments: with spouse "hands on" assisted pt safely navigated the stairs   Wheelchair Mobility    Modified Rankin (Stroke Patients Only)       Balance                                            Cognition Arousal/Alertness: Awake/alert Behavior During Therapy: WFL for tasks assessed/performed Overall Cognitive Status: Within Functional Limits for tasks assessed                                 General Comments: appears at prior level with both cognition and self awareness      Exercises      General Comments        Pertinent Vitals/Pain Pain Assessment: 0-10 Pain Score: 3  Pain Location: Rt knee Pain Descriptors / Indicators: Discomfort;Operative site guarding Pain Intervention(s): Monitored during session;Premedicated before session;Repositioned;Ice applied    Home Living                      Prior Function  PT Goals (current goals can now be found in the care plan section) Progress towards PT goals: Progressing toward goals    Frequency    7X/week      PT Plan Current plan remains appropriate    Co-evaluation              AM-PAC PT "6 Clicks" Mobility   Outcome Measure  Help needed turning from your back to your side while in a flat bed without using bedrails?: None Help needed moving from lying on your back to sitting on the side of a flat bed without using bedrails?: None Help needed moving to and from a bed to a chair (including a wheelchair)?: A Little Help needed standing up from a chair using your arms (e.g., wheelchair or bedside chair)?: A Little Help needed to walk in  hospital room?: A Little Help needed climbing 3-5 steps with a railing? : A Little 6 Click Score: 20    End of Session Equipment Utilized During Treatment: Gait belt Activity Tolerance: Patient tolerated treatment well Patient left: in chair;with call bell/phone within reach Nurse Communication: Mobility status PT Visit Diagnosis: Unsteadiness on feet (R26.81);Muscle weakness (generalized) (M62.81);Pain Pain - Right/Left: Left Pain - part of body: Knee     Time: 1335-1355 PT Time Calculation (min) (ACUTE ONLY): 20 min  Charges:  $Gait Training: 8-22 mins                     {Alailah Safley  PTA Acute  Rehabilitation [employer]      [phone] Office      [phone]

## 2020-12-09 NOTE — Progress Notes (Addendum)
Subjective: 9 Days Post-Op Procedure(s) (LRB): TOTAL KNEE ARTHROPLASTY (Left) Patient reports pain as 3 on 0-10 scale.   Denies CP or SOB.  Voiding without difficulty. Positive flatus. Objective: Vital signs in last 24 hours: Temp:  [97.3 F (36.3 C)-98.8 F (37.1 C)] 97.3 F (36.3 C) (10/07 1255) Pulse Rate:  [62-76] 76 (10/07 1255) Resp:  [15-20] 20 (10/07 1255) BP: (121-141)/(56-68) 141/57 (10/07 1255) SpO2:  [93 %-97 %] 96 % (10/07 1255)  Intake/Output from previous day: 10/06 0701 - 10/07 0700 In: 2388.7 [P.O.:360; I.V.:1011.8; IV Piggyback:1016.9] Out: 2550 [Urine:2550] Intake/Output this shift: Total I/O In: -  Out: 475 [Urine:475]  Recent Labs    12/07/20 1617 12/09/20 0406  HGB 8.9* 8.4*   Recent Labs    12/07/20 1617 12/09/20 0406  WBC 9.9 7.8  RBC 2.94* 2.82*  HCT 26.3* 25.2*  PLT 279 315   Recent Labs    12/07/20 1617 12/09/20 0406  NA 143 141  K 4.3 3.9  CL 109 113*  CO2 27 24  BUN 21 16  CREATININE 0.90 0.80  GLUCOSE 124* 114*  CALCIUM 8.5* 8.1*   No results for input(s): LABPT, INR in the last 72 hours.  Neurologically intact Neurovascular intact Sensation intact distally Incision: dressing C/D/I No cellulitis present Compartment soft  Dorsum right hand with erythema and pain with palpation. No exudate Some proximal erythema  Assessment/Plan:  9 Days Post-Op Procedure(s) (LRB): TOTAL KNEE ARTHROPLASTY (Left) Orthostatics better Advance diet Up with therapy  Cellulitis right hand forearm I spoke with medicine service they have suggested ceftriaxone 1 g followed by Keflex 500 mg every 6 hours for 7 to 10 days. Elevation, warm compress.  Active Problems:   S/P TKR (total knee replacement) using cement   PAF (paroxysmal atrial fibrillation) ([place])      Donald [name] 12/09/2020, @[facility] 

## 2020-12-09 NOTE — Progress Notes (Signed)
PROGRESS NOTE    Donald [name]  ZDG:[id number] DOB: [birth year] DOA: 11/30/2020 PCP: Ma Hillock, DO   Brief Narrative:  The patient is a 75 year old overweight Caucasian male with a past medical history significant for but not limited to CAD status post CABG, hyperlipidemia as well as other comorbidities who came in for elective knee replacement and underwent a total knee replacement on 11/22/2020.  He had an episode of dizziness and hypotension on 12/02/2020 and was found to be in A. fib with RVR and the hospitalist team was consulted.  He now is having some presyncope and is initially placed on a diltiazem drip at 12/02/2020 and converted to sinus rhythm and he was placed on oral diltiazem however he flipped in and out of atrial fibrillation.  Cardiology was subsequently consulted and he was placed on IV amiodarone on 12/05/2020.  He has had further work-up per cardiology and he had a 2D echo was fairly unremarkable and they recommended placing patient on Eliquis and changing the patient to p.o. amiodarone 400 mg p.o. twice daily for 10 days and then 200 mg p.o. daily.    He had been persistently orthostatic with symptoms last few days and received his diltiazem a few days ago which has now been stopped.  He received two 250 mL fluid boluses the day before yesterday and started on Maintenance IVF at that time.  He was given a 1 L bolus yesterday and will give a 1000 mL Fluid bolus today as he remains orthostatic but his symptoms have improved and he is no longer dizzy. He now has an Abdominal Binder and TED hose.    Patient also had a an infiltrated IV and became swollen and a little bit cellulitic so orthopedic surgery started the patient on IV ceftriaxone today and will place him on 500 mg of p.o. Keflex every 6 for 7 to 10 days with elevation and warm compresses.  Since he is no longer symptomatic but remains slightly orthostatic, he can safely be discharged home and follow-up with PCP and  cardiology outpatient setting as well as orthopedic surgery within 1 to 2 weeks.  Assessment & Plan:   Active Problems:   S/P TKR (total knee replacement) using cement   PAF (paroxysmal atrial fibrillation) (HCC)  Hypotension and presyncope in the setting of new onset atrial fibrillation with RVR -Initially placed on a diltiazem drip on 12/02/2020 and converted to sinus rhythm but then he was placed on oral diltiazem and he felt in and out of A. fib. -Diltiazem p.o. was increased to 180 mg p.o. daily given his breakthrough atrial fibrillation with ambulation but he became hypotensive again and considering his fluctuation of the blood pressure cardiology transition and IV amiodarone infusion he converted normal sinus rhythm -Cardiology recommends starting p.o. amiodarone 400 g twice daily as his infusion is now stopped and recommending twice daily dosing for 10 days and then transitioning to p.o. amiodarone 200 mg daily -Cardiology also recommends continuing Eliquis is renally dosed and recommending follow-up in the clinic for A. fib at discharge -Patient does have a history of atrial flutter status post ablation in 2011  Hypertension -Has been hypotensive and has taken midodrine in the past 2.5 mg p.o. 3 times daily as needed for dizziness and have now increased it to 5 mg 3 times daily scheduled; See below -Continue to monitor blood pressures per protocol -Last blood pressure reading was 141/50  Hyperlipidemia -C/w Atorvastatin 40 mg nightly and will need to follow-up with  cardiology outpatient setting  History of CAD status post CABG in 2001 -His aspirin has now been changed to Eliquis given his A. fib and he will need to follow-up with cardiology outpatient setting -C/w Statin at D/C   Orthostatic Hypotension -In the setting of diltiazem administration -Have discontinued diltiazem now and patient received 2 250 mL boluses a few days ago and was placed on maintenance IV fluids at 75  mils per hour the day before yesterday and given 1 L bolus yesterday and will give a 1 Liter bolus today; we will stop his maintenance IV fluid hydration now -Repeat orthostatic vital signs per nursing showed Lying: BP:143/70, HR: 71; Sitting: BP: 145/65, HR: 69; Standing: BP: 122/74, HR: 82; Standing after 3 minutes: BP: 129/62, HR: 77. Pt reports no symptoms of hypotension or dizziness -Since he is no longer symptomatic can likely safely be discharged home and follow-up with PCP and cardiology in outpatient setting as well as orthopedic surgery -C/w TED Hose and Abdominal Binder -Orthopedic surgery has minimizes narcotics -C/w PT/OT and repeat Orthostatics in the AM   Right arm/hand IV infiltration with questionable cellulitis -Was swollen and Ortho recommending elevation and warm compresses -Given concern for a cellulitic infection the case was discussed with orthopedic surgery who feel the patient needs antibiotics and they have started him on IV ceftriaxone x1 and we made the recommendation to change him to p.o. Keflex 500 mg for 7 to 10 days -Needed to monitor closely but patient can be discharged at any point given that he is afebrile with no leukocytosis that he is going on antibiotics  Normocytic Anemia -Likely dilutional drop from the fluid that he got -Patient is hemoglobin/hematocrit went from 10.7/32.0 -> 10.4/31.3 -> 8.9/26.3 -> 8.4/25.2 -Pending checking anemia panel in outpatient setting and will discontinue IV fluids at this time -Continue to monitor for signs and symptoms of bleeding now that the patient is Anticoagulated; currently no overt bleeding noted -Repeat CBC within 1 week of Discharge  DVT prophylaxis: Anticoagulated with Eliquis Code Status: FULL CODE  Family Communication: No family present at bedside Disposition Plan: Per primary team orthopedic surgery planning on discharging patient once no longer Orthostatic   Status is: Inpatient  Remains inpatient  appropriate because:Unsafe d/c plan, IV treatments appropriate due to intensity of illness or inability to take PO, and Inpatient level of care appropriate due to severity of illness  Dispo: The patient is from: Home              Anticipated d/c is to: Home              Patient currently is medically stable to d/c if not Orthostatic    Difficult to place patient No   Consultants:  Cardiology [facility] Orthopedic Surgery (Primary)  Procedures: ECHOCARDIOGRAM IMPRESSIONS     1. Left ventricular ejection fraction, by estimation, is 70 to 75%. The  left ventricle has hyperdynamic function. The left ventricle has no  regional wall motion abnormalities. Left ventricular diastolic function  could not be evaluated.   2. Right ventricular systolic function is normal. The right ventricular  size is normal.   3. The mitral valve is grossly normal. No evidence of mitral valve  regurgitation.   4. The aortic valve is tricuspid. Aortic valve regurgitation is not  visualized. No aortic stenosis is present.   5. The inferior vena cava is normal in size with <50% respiratory  variability, suggesting right atrial pressure of 8 mmHg.   Comparison(s): Changes  from prior study are noted. 01/01/2020: LVEF  55-60%, normal atrial size.   FINDINGS   Left Ventricle: Left ventricular ejection fraction, by estimation, is 70  to 75%. The left ventricle has hyperdynamic function. The left ventricle  has no regional wall motion abnormalities. The left ventricular internal  cavity size was normal in size.  There is borderline left ventricular hypertrophy. Left ventricular  diastolic function could not be evaluated due to atrial fibrillation. Left  ventricular diastolic function could not be evaluated.   Right Ventricle: The right ventricular size is normal. No increase in  right ventricular wall thickness. Right ventricular systolic function is  normal.   Left Atrium: Left atrial size was normal in size.    Right Atrium: Right atrial size was normal in size.   Pericardium: There is no evidence of pericardial effusion.   Mitral Valve: The mitral valve is grossly normal. No evidence of mitral  valve regurgitation.   Tricuspid Valve: The tricuspid valve is grossly normal. Tricuspid valve  regurgitation is trivial.   Aortic Valve: The aortic valve is tricuspid. Aortic valve regurgitation is  not visualized. No aortic stenosis is present.   Pulmonic Valve: The pulmonic valve was grossly normal. Pulmonic valve  regurgitation is not visualized.   Aorta: The aortic root and ascending aorta are structurally normal, with  no evidence of dilitation.   Venous: The inferior vena cava is normal in size with less than 50%  respiratory variability, suggesting right atrial pressure of 8 mmHg.   IAS/Shunts: No atrial level shunt detected by color flow Doppler.   EKG: Rhythm strip during this exam demostrated AFib wtih RVR.   Antimicrobials:  Anti-infectives (From admission, onward)    Start     Dose/Rate Route Frequency Ordered Stop   12/09/20 1430  cefTRIAXone (ROCEPHIN) injection 1 g        1 g Intramuscular Every 24 hours 12/09/20 1331     11/30/20 1500  ceFAZolin (ANCEF) IVPB 2g/100 mL premix        2 g 200 mL/hr over 30 Minutes Intravenous Every 6 hours 11/30/20 1418 11/30/20 2136   11/30/20 0600  ceFAZolin (ANCEF) IVPB 2g/100 mL premix        2 g 200 mL/hr over 30 Minutes Intravenous On call to O.R. 11/30/20 0533 11/30/20 0735        Subjective: Seen and examined at bedside and he was not dizzy at all this morning after his orthostatics were done.  He feels like he is improved and he is given another liter bolus and repeat orthostatics were done and he did drop was not symptomatic.  Orthopedic surgery is concerned about a right hand/arm cellulitis so they started him on antibiotics.  Patient can be discharged from a medical standpoint at this time and follow-up with PCP in 1 week for  reevaluation and follow-up with cardiology outpatient setting for orthostatic hypotension  Objective: Vitals:   12/09/20 0438 12/09/20 1248 12/09/20 1252 12/09/20 1255  BP: 122/68 137/61 139/63 (!) 141/57  Pulse: 74 72 72 76  Resp: 17 20 20 20   Temp: 98.5 F (36.9 C) 98.1 F (36.7 C) 98.7 F (37.1 C) (!) 97.3 F (36.3 C)  TempSrc: Oral Oral Oral Oral  SpO2:  95% 97% 96%  Weight:      Height:        Intake/Output Summary (Last 24 hours) at 12/09/2020 1447 Last data filed at 12/09/2020 1440 Gross per 24 hour  Intake 2028.71 ml  Output 2075  ml  Net -46.29 ml    Filed Weights   11/30/20 1449  Weight: 79.5 kg   Examination: Physical Exam:  Constitutional: WN/WD overweight Caucasian male in no acute distress appears calm and comfortable  Eyes: Lids and conjunctivae normal, sclerae anicteric  ENMT: External Ears, Nose appear normal. Grossly normal hearing. Mucous membranes are moist.  Neck: Appears normal, supple, no cervical masses, normal ROM, no appreciable thyromegaly; no JVD Respiratory: Slightly diminished to auscultation bilaterally, no wheezing, rales, rhonchi or crackles. Normal respiratory effort and patient is not tachypenic. No accessory muscle use.  Cardiovascular: Irregularly Irregular, no murmurs / rubs / gallops. S1 and S2 auscultated. Trace edema on the Left compared to the Right .  Abdomen: Soft, non-tender, non-distended. Bowel sounds positive.  GU: Deferred. Musculoskeletal: No clubbing / cyanosis of digits/nails. No joint deformity upper and lower extremities but has a Left Knee Incision .  Skin: No rashes, lesions, ulcers. No induration; Warm and dry.  Neurologic: CN 2-12 grossly intact with no focal deficits. Romberg sign and cerebellar reflexes not assessed.  Psychiatric: Normal judgment and insight. Alert and oriented x 3. Normal mood and appropriate affect.   Data Reviewed: I have personally reviewed following labs and imaging studies  CBC: Recent  Labs  Lab 12/03/20 0425 12/07/20 1617 12/09/20 0406  WBC 10.7* 9.9 7.8  NEUTROABS  --  7.6 6.2  HGB 10.4* 8.9* 8.4*  HCT 31.3* 26.3* 25.2*  MCV 91.3 89.5 89.4  PLT 166 279 542    Basic Metabolic Panel: Recent Labs  Lab 12/07/20 1617 12/09/20 0406  NA 143 141  K 4.3 3.9  CL 109 113*  CO2 27 24  GLUCOSE 124* 114*  BUN 21 16  CREATININE 0.90 0.80  CALCIUM 8.5* 8.1*  MG 1.8 2.0  PHOS 3.6 3.1    GFR: Estimated Creatinine Clearance: 75.1 mL/min (by C-G formula based on SCr of 0.8 mg/dL). Liver Function Tests: Recent Labs  Lab 12/05/20 1341 12/07/20 1617 12/09/20 0406  AST 21 23 24   ALT 13 18 22   ALKPHOS 55 65 60  BILITOT 1.0 0.9 0.9  PROT 5.9* 5.8* 5.4*  ALBUMIN 2.7* 2.5* 2.4*    No results for input(s): LIPASE, AMYLASE in the last 168 hours. No results for input(s): AMMONIA in the last 168 hours. Coagulation Profile: No results for input(s): INR, PROTIME in the last 168 hours. Cardiac Enzymes: No results for input(s): CKTOTAL, CKMB, CKMBINDEX, TROPONINI in the last 168 hours. BNP (last 3 results) No results for input(s): PROBNP in the last 8760 hours. HbA1C: No results for input(s): HGBA1C in the last 72 hours. CBG: No results for input(s): GLUCAP in the last 168 hours. Lipid Profile: No results for input(s): CHOL, HDL, LDLCALC, TRIG, CHOLHDL, LDLDIRECT in the last 72 hours. Thyroid Function Tests: No results for input(s): TSH, T4TOTAL, FREET4, T3FREE, THYROIDAB in the last 72 hours.  Anemia Panel: No results for input(s): VITAMINB12, FOLATE, FERRITIN, TIBC, IRON, RETICCTPCT in the last 72 hours. Sepsis Labs: No results for input(s): PROCALCITON, LATICACIDVEN in the last 168 hours.  No results found for this or any previous visit (from the past 240 hour(s)).   RN Pressure Injury Documentation:     Estimated body mass index is 29.17 kg/m as calculated from the following:   Height as of this encounter: 5\' 5"  (1.651 m).   Weight as of this  encounter: 79.5 kg.  Malnutrition Type:   Malnutrition Characteristics:   Nutrition Interventions:    Radiology Studies: No results  found.  Scheduled Meds:  acidophilus  1 capsule Oral Daily   amiodarone  400 mg Oral BID   apixaban  5 mg Oral BID   atorvastatin  40 mg Oral QHS   cefTRIAXone  1 g Intramuscular Q24H   docusate sodium  100 mg Oral BID   midodrine  5 mg Oral TID WC   multivitamin with minerals   Oral Daily   Continuous Infusions:  sodium chloride 75 mL/hr at 12/09/20 0653   methocarbamol (ROBAXIN) IV 500 mg (11/30/20 1033)    LOS: 7 days   Kerney Elbe, DO [facility] PAGER is on [place]  If 7PM-7AM, please contact night-coverage [email or web address]

## 2020-12-09 NOTE — Progress Notes (Signed)
Physical Therapy Treatment Patient Details Name: Donald [name] MRN: [id number] DOB: 08/04/1945 Today's Date: 12/09/2020   History of Present Illness Pt is a 75 y.o. male s/p L TKA on 11/30/20. PMH significant for CAD s/p CABG x5 , MI, OA,  RTCR (2004; 2007).    PT Comments    POD #9 pm session Only assisted to and from bathroom.  General transfer comment: good hand placement to push self up as well as good safety with turns.  Also ovberved a toilet transfer in which pt was self able to rise and lower NO assist from Therapist. General Gait Details: assisted with amb to and from bathyroom again.  Spouse present and observed.  Pt present with increased gait stability and decreased tremors.  Only "faint" feeling after getting out of bathroom.  RN stated he is to receive "one more Bolus". Will return after Bolus to attempt stairs.   Recommendations for follow up therapy are one component of a multi-disciplinary discharge planning process, led by the attending physician.  Recommendations may be updated based on patient status, additional functional criteria and insurance authorization.  Follow Up Recommendations        Equipment Recommendations       Recommendations for Other Services       Precautions / Restrictions Precautions Precautions: Fall Precaution Comments: instructed no pillow under knee and to elevate to decrease swelling Required Braces or Orthoses: Other Brace Other Brace: ABD binder for OOB due to HYPOTENSION Restrictions Weight Bearing Restrictions: No LLE Weight Bearing: Weight bearing as tolerated     Mobility  Bed Mobility               General bed mobility comments: OOB in recliner    Transfers   Equipment used: Rolling walker (2 wheeled) Transfers: Sit to/from [facility] Sit to Stand: Supervision Stand pivot transfers: Supervision       General transfer comment: good hand placement to push self up as well as good safety with  turns.  Also ovberved a toilet transfer in which pt was self able to rise and lower NO assist from Therapist.  Ambulation/Gait Ambulation/Gait assistance: Supervision Gait Distance (Feet): 24 Feet Assistive device: Rolling walker (2 wheeled) Gait Pattern/deviations: Step-to pattern;Decreased stride length;Decreased weight shift to left;Decreased stance time - left Gait velocity: decreased   General Gait Details: assisted with amb to and from bathyroom again.  Spouse present and observed.  Pt present with increased gait stability and decreased tremors.  Only "faint" feeling after getting out of bathroom.  RN stated he is to receive "one more Bolus". Will return after Bolus to attempt stairs.   Stairs             Wheelchair Mobility    Modified Rankin (Stroke Patients Only)       Balance                                            Cognition Arousal/Alertness: Awake/alert Behavior During Therapy: WFL for tasks assessed/performed Overall Cognitive Status: Within Functional Limits for tasks assessed                                 General Comments: appears at prior level with both cognition and self awareness      Exercises  General Comments        Pertinent Vitals/Pain Pain Assessment: 0-10 Pain Score: 3  Pain Location: Rt knee Pain Descriptors / Indicators: Discomfort;Operative site guarding Pain Intervention(s): Monitored during session;Premedicated before session;Repositioned;Ice applied                        End of Session  Pt in recliner with spouse in room             Time: 1150-1208 PT Time Calculation (min) (ACUTE ONLY): 18 min  Charges:  $Gait Training: 8-22 mins                     {Khiley Lieser  PTA Acute  Rehabilitation [employer]      [phone] Office      [phone]

## 2020-12-09 NOTE — Progress Notes (Signed)
Physical Therapy Treatment Patient Details Name: Donald [name] MRN: [id number] DOB: Jan 01, 1946 Today's Date: 12/09/2020   History of Present Illness Pt is a 75 y.o. male s/p L TKA on 11/30/20. PMH significant for CAD s/p CABG x5 , MI, OA,  RTCR (2004; 2007).    PT Comments    POD # 9 am session Pt was self able to transition to EOB.  Did not need to use his belt to move L LE.  Allowed increased time to sit and applied ABD binder.  TEDS already in place.  Assisted with amb to bathroom.  General transfer comment: good hand placement to push self up as well as good safety with turns.  Also ovberved a toilet transfer in which pt was self able to rise and lower NO assist from Therapist.General Gait Details: assisted with amb to anf from bathroom only this morning.  Instructed to take several seated rest breaks when he starts to feel "funny".  Pt did well to self amb to bathroom, a little static standing for self hygiene and instructed to "lean on sink" for energy conservation when standing to wash hands.  C/o slight "funny" feeling.  Required a brief seated rest break on 3:1.  Pt was ablt to self amb out of bathroom to recliner chair.  Mild tremors but sustainable. Positioned in recliner to comfort and applied ICE. Educated pt on morning routine to take feq breaks and do what he can seated. Will come back when spouse is here to amb in hallway and practice stairs in prep for D/C to home today.   Recommendations for follow up therapy are one component of a multi-disciplinary discharge planning process, led by the attending physician.  Recommendations may be updated based on patient status, additional functional criteria and insurance authorization.  Follow Up Recommendations  Home health PT     Equipment Recommendations  None recommended by PT    Recommendations for Other Services       Precautions / Restrictions Precautions Precautions: Fall Precaution Comments: instructed no pillow under knee  and to elevate to decrease swelling Required Braces or Orthoses: Other Brace Other Brace: ABD binder for OOB due to HYPOTENSION Restrictions Weight Bearing Restrictions: No LLE Weight Bearing: Weight bearing as tolerated     Mobility  Bed Mobility Overal bed mobility: Modified Independent             General bed mobility comments: pt self able with increased time.  No belt needed to move L LE off bed.    Transfers Overall transfer level: Needs assistance Equipment used: Rolling walker (2 wheeled) Transfers: Sit to/from [facility] Sit to Stand: Supervision Stand pivot transfers: Supervision       General transfer comment: good hand placement to push self up as well as good safety with turns.  Also ovberved a toilet transfer in which pt was self able to rise and lower NO assist from Therapist.  Ambulation/Gait Ambulation/Gait assistance: Supervision Gait Distance (Feet): 24 Feet (12 feet x 2 to and from bathroom) Assistive device: Rolling walker (2 wheeled) Gait Pattern/deviations: Step-to pattern;Decreased stride length;Decreased weight shift to left;Decreased stance time - left Gait velocity: decreased   General Gait Details: assisted with amb to anf from bathroom only this morning.  Instructed to take several seated rest breaks when he starts to feel "funny".  Pt did well to self amb to bathroom, a little static standing for self hygiene and instructed to "lean on sink" for energy conservation when standing  to wash hands.  Required a brief seated rest break on 3:1.  Pt was ablt to self amb out of bathroom to recliner chair.  Mild tremors but sustainable.   Stairs             Wheelchair Mobility    Modified Rankin (Stroke Patients Only)       Balance                                            Cognition Arousal/Alertness: Awake/alert Behavior During Therapy: WFL for tasks assessed/performed Overall Cognitive Status:  Within Functional Limits for tasks assessed                                 General Comments: appears at prior level with both cognition and self awareness      Exercises      General Comments        Pertinent Vitals/Pain Pain Assessment: 0-10 Pain Score: 3  Pain Location: Rt knee Pain Descriptors / Indicators: Discomfort;Operative site guarding Pain Intervention(s): Monitored during session;Premedicated before session;Repositioned;Ice applied    Home Living                      Prior Function            PT Goals (current goals can now be found in the care plan section) Progress towards PT goals: Progressing toward goals    Frequency    7X/week      PT Plan Current plan remains appropriate    Co-evaluation              AM-PAC PT "6 Clicks" Mobility   Outcome Measure  Help needed turning from your back to your side while in a flat bed without using bedrails?: None Help needed moving from lying on your back to sitting on the side of a flat bed without using bedrails?: None Help needed moving to and from a bed to a chair (including a wheelchair)?: A Little Help needed standing up from a chair using your arms (e.g., wheelchair or bedside chair)?: A Little Help needed to walk in hospital room?: A Little Help needed climbing 3-5 steps with a railing? : A Little 6 Click Score: 20    End of Session Equipment Utilized During Treatment: Gait belt Activity Tolerance: Patient tolerated treatment well Patient left: in chair;with call bell/phone within reach Nurse Communication: Mobility status (pt will need another PT session with spouse to practice stairs) PT Visit Diagnosis: Unsteadiness on feet (R26.81);Muscle weakness (generalized) (M62.81);Pain Pain - Right/Left: Left Pain - part of body: Knee     Time: 0910-0946 PT Time Calculation (min) (ACUTE ONLY): 36 min  Charges:  $Gait Training: 8-22 mins $Therapeutic Activity: 8-22  mins                     {Cabrina Shiroma  PTA Acute  Rehabilitation Services Pager      [phone] Office      [phone]

## 2020-12-10 LAB — CBC WITH DIFFERENTIAL/PLATELET
Abs Immature Granulocytes: 0.05 10*3/uL (ref 0.00–0.07)
Basophils Absolute: 0.1 10*3/uL (ref 0.0–0.1)
Basophils Relative: 1 %
Eosinophils Absolute: 0.3 10*3/uL (ref 0.0–0.5)
Eosinophils Relative: 4 %
HCT: 25.7 % — ABNORMAL LOW (ref 39.0–52.0)
Hemoglobin: 8.3 g/dL — ABNORMAL LOW (ref 13.0–17.0)
Immature Granulocytes: 1 %
Lymphocytes Relative: 13 %
Lymphs Abs: 1.2 10*3/uL (ref 0.7–4.0)
MCH: 29 pg (ref 26.0–34.0)
MCHC: 32.3 g/dL (ref 30.0–36.0)
MCV: 89.9 fL (ref 80.0–100.0)
Monocytes Absolute: 0.7 10*3/uL (ref 0.1–1.0)
Monocytes Relative: 7 %
Neutro Abs: 7.2 10*3/uL (ref 1.7–7.7)
Neutrophils Relative %: 74 %
Platelets: 375 10*3/uL (ref 150–400)
RBC: 2.86 MIL/uL — ABNORMAL LOW (ref 4.22–5.81)
RDW: 13.6 % (ref 11.5–15.5)
WBC: 9.6 10*3/uL (ref 4.0–10.5)
nRBC: 0 % (ref 0.0–0.2)

## 2020-12-10 LAB — COMPREHENSIVE METABOLIC PANEL
ALT: 21 U/L (ref 0–44)
AST: 22 U/L (ref 15–41)
Albumin: 2.4 g/dL — ABNORMAL LOW (ref 3.5–5.0)
Alkaline Phosphatase: 61 U/L (ref 38–126)
Anion gap: 6 (ref 5–15)
BUN: 14 mg/dL (ref 8–23)
CO2: 26 mmol/L (ref 22–32)
Calcium: 8.3 mg/dL — ABNORMAL LOW (ref 8.9–10.3)
Chloride: 112 mmol/L — ABNORMAL HIGH (ref 98–111)
Creatinine, Ser: 0.98 mg/dL (ref 0.61–1.24)
GFR, Estimated: >60 mL/min (ref >60)
Glucose, Bld: 106 mg/dL — ABNORMAL HIGH (ref 70–99)
Potassium: 4.5 mmol/L (ref 3.5–5.1)
Sodium: 144 mmol/L (ref 135–145)
Total Bilirubin: 0.9 mg/dL (ref 0.3–1.2)
Total Protein: 5.4 g/dL — ABNORMAL LOW (ref 6.5–8.1)

## 2020-12-10 LAB — FOLATE: Folate: 21.9 ng/mL (ref >5.9)

## 2020-12-10 LAB — RETICULOCYTES
Immature Retic Fract: 23.3 % — ABNORMAL HIGH (ref 2.3–15.9)
RBC.: 2.82 MIL/uL — ABNORMAL LOW (ref 4.22–5.81)
Retic Count, Absolute: 58.1 10*3/uL (ref 19.0–186.0)
Retic Ct Pct: 2.1 % (ref 0.4–3.1)

## 2020-12-10 LAB — IRON AND TIBC
Iron: 29 ug/dL — ABNORMAL LOW (ref 45–182)
Saturation Ratios: 18 % (ref 17.9–39.5)
TIBC: 162 ug/dL — ABNORMAL LOW (ref 250–450)
UIBC: 133 ug/dL

## 2020-12-10 LAB — FERRITIN: Ferritin: 327 ng/mL (ref 24–336)

## 2020-12-10 LAB — VITAMIN B12: Vitamin B-12: 355 pg/mL (ref 180–914)

## 2020-12-10 LAB — PHOSPHORUS: Phosphorus: 3.5 mg/dL (ref 2.5–4.6)

## 2020-12-10 LAB — MAGNESIUM: Magnesium: 2 mg/dL (ref 1.7–2.4)

## 2020-12-10 MED ORDER — SODIUM CHLORIDE 0.9 % IV BOLUS
1000.0000 mL | Freq: Once | INTRAVENOUS | Status: AC
  Administered 2020-12-10: 1000 mL via INTRAVENOUS

## 2020-12-10 MED ORDER — MIDODRINE HCL 5 MG PO TABS: 5.0000 mg | ORAL_TABLET | Freq: Three times a day (TID) | ORAL | 0 refills | Status: DC

## 2020-12-10 NOTE — Progress Notes (Addendum)
Subjective: 10 Days Post-Op Procedure(s) (LRB): TOTAL KNEE ARTHROPLASTY (Left) Patient reports pain as 2.   Denies CP or SOB.  Voiding without difficulty. Positive flatus.  States improvement of the cellulitis on the right forearm Objective: Vital signs in last 24 hours: Temp:  [97.3 F (36.3 C)-99.2 F (37.3 C)] 98.8 F (37.1 C) (10/08 0550) Pulse Rate:  [66-76] 66 (10/08 0550) Resp:  [16-20] 16 (10/08 0550) BP: (126-141)/(50-68) 126/68 (10/08 0550) SpO2:  [93 %-97 %] 93 % (10/08 0550)  Intake/Output from previous day: 10/07 0701 - 10/08 0700 In: 1507.1 [I.V.:1507.1] Out: 1975 [Urine:1975] Intake/Output this shift: No intake/output data recorded.  Recent Labs    12/07/20 1617 12/09/20 0406 12/10/20 0411  HGB 8.9* 8.4* 8.3*   Recent Labs    12/09/20 0406 12/10/20 0411  WBC 7.8 9.6  RBC 2.82* 2.86*  2.82*  HCT 25.2* 25.7*  PLT 315 375   Recent Labs    12/09/20 0406 12/10/20 0411  NA 141 144  K 3.9 4.5  CL 113* 112*  CO2 24 26  BUN 16 14  CREATININE 0.80 0.98  GLUCOSE 114* 106*  CALCIUM 8.1* 8.3*   No results for input(s): LABPT, INR in the last 72 hours.  Neurologically intact Neurovascular intact Sensation intact distally Incision: dressing C/D/I No cellulitis present Compartment soft  Dorsum right hand with erythema and no pain on palpation and no drainage. No exudate Some proximal erythema  Assessment/Plan:  10 Days Post-Op Procedure(s) (LRB): TOTAL KNEE ARTHROPLASTY (Left) Orthostatics better Advance diet Up with therapy  Cellulitis on the right arm is improving.  He is doing much better also from a left total knee postoperative standpoint.  I believe he is appropriate to discharge home today.  We will give him Keflex for 10 days post discharge and have him follow-up with Dr. Maxie Better in the office next week as scheduled.  Active Problems:   S/P TKR (total knee replacement) using cement   PAF (paroxysmal atrial fibrillation) ([place])       Nicholes Stairs 12/10/2020, @NOW 

## 2020-12-10 NOTE — Progress Notes (Addendum)
Patient was Discharged home by the Primary Team prior to being seen and examined. Spoke with Nursing who states that patient was minimally orthostatic and slightly symptomatic briefly when standing up but he did not slowly stand up. Standing for 3 minutes he was not dizzy. Given an additional 1 Liter bolus prior to discharge. He will need to follow up with his PCP, Cardiologist, and Ortho in the outpatient setting. He was instructed by nursing to stand up slowly and was sent home with Abdominal Binder, TED Hose and an order for Midodrine 5 mg po TID. Per Ortho his Cellulitis was improved.

## 2020-12-10 NOTE — TOC Progression Note (Signed)
Transition of Care [facility]) - Progression Note    Patient Details  Name: Donald [name] MRN: [id number] Date of Birth: Oct 19, 1945  Transition of Care [facility]) CM/SW Contact  Marcelline Deist Donavan Foil,  Phone Number: 12/10/2020, 12:54 PM  Clinical Narrative:   Pt for dc today- initially had planned to do Outpt therapies at [facility] but now wants to start with HH;per PT recommendations today as well.  Referral made to [place]- pt agreeable to plans- denies any DME needs.       Barriers to Discharge: No Barriers Identified  Expected Discharge Plan and Services           Expected Discharge Date: 12/10/20               DME Arranged: N/A DME Agency: [employer] Date DME Agency Contacted: 12/10/20 Time DME Agency Contacted: [id number] Representative spoke with at DME Agency: Ramond Marrow HH Arranged: RN, PT, OT [place] Agency: NA         Social Determinants of Health ([place]) Interventions    Readmission Risk Interventions No flowsheet data found.

## 2020-12-10 NOTE — Progress Notes (Signed)
Physical Therapy Treatment Patient Details Name: Donald [name] MRN: [id number] DOB: [birth year] Today's Date: 12/10/2020   History of Present Illness Pt is a 75 y.o. male s/p L TKA on 11/30/20. PMH significant for CAD s/p CABG x5 , MI, OA,  RTCR (2004; 2007).    PT Comments    Pt provided written/illustrated HEP, able to perform exercises with decreased L knee ROM, some pain but tolerable. Pt reports no BM for 5-6 days, able to ambulate to restroom to have BM, performs peri care with supv for safety and ambulates back to recliner. Cues for L foot down with transfers and ambulation to encourage increased weight-bearing with good carryover. Pt denies dizziness with mobility, pain 5/10 with exercises and ambulation that doesn't worsen with activity. Pt reports confidence with stairs and ambulation performed with PT yesterday, planning to d/c home today with spouse to assist. All questions answered.   Recommendations for follow up therapy are one component of a multi-disciplinary discharge planning process, led by the attending physician.  Recommendations may be updated based on patient status, additional functional criteria and insurance authorization.  Follow Up Recommendations  Home health PT     Equipment Recommendations  None recommended by PT    Recommendations for Other Services       Precautions / Restrictions Precautions Precautions: Fall Precaution Comments: instructed no pillow under knee and to elevate to decrease swelling Required Braces or Orthoses: Other Brace Other Brace: ABD binder for OOB due to HYPOTENSION Restrictions Weight Bearing Restrictions: No LLE Weight Bearing: Weight bearing as tolerated     Mobility  Bed Mobility  General bed mobility comments: in recliner upon arrival    Transfers Overall transfer level: Needs assistance Equipment used: Rolling walker (2 wheeled) Transfers: Sit to/from Stand Sit to Stand: Supervision  General transfer comment: supv  with transfers, prefers to maintain LLE elevated off of floor requiring VCs to place down  Ambulation/Gait Ambulation/Gait assistance: Supervision Gait Distance (Feet): 30 Feet Assistive device: Rolling walker (2 wheeled) Gait Pattern/deviations: Step-to pattern;Decreased stride length;Decreased weight shift to left;Decreased stance time - left Gait velocity: decreased   General Gait Details: step to pattern to restroom and back, L knee lacking full extension in stance phase, no dizziness or LOB   Stairs             Wheelchair Mobility    Modified Rankin (Stroke Patients Only)       Balance Overall balance assessment: Needs assistance    Standing balance support: During functional activity Standing balance-Leahy Scale: Poor Standing balance comment: reliant on UE support     Cognition Arousal/Alertness: Awake/alert Behavior During Therapy: WFL for tasks assessed/performed Overall Cognitive Status: Within Functional Limits for tasks assessed               Exercises Total Joint Exercises Ankle Circles/Pumps: Seated;AROM;Both;20 reps Quad Sets: Seated;AROM;Strengthening;Both;10 reps Heel Slides: Seated;AROM;Strengthening;Both;10 reps Straight Leg Raises: Seated;AROM;Left;10 reps Marching in Standing: Standing;AROM;Strengthening;Left;15 reps Other Exercises Other Exercises: STS, 5 reps from recliner, VCs for LLE placed onto floor    General Comments        Pertinent Vitals/Pain Pain Assessment: 0-10 Pain Score: 5  Pain Location: Rt knee Pain Descriptors / Indicators: Sore;Tightness Pain Intervention(s): Limited activity within patient's tolerance;Monitored during session;Repositioned;RN gave pain meds during session (RN getting ice)    Home Living                      Prior Function  PT Goals (current goals can now be found in the care plan section) Acute Rehab PT Goals Patient Stated Goal: Regain IND PT Goal Formulation: With  patient Time For Goal Achievement: 12/14/20 Potential to Achieve Goals: Good Progress towards PT goals: Progressing toward goals    Frequency    7X/week      PT Plan Current plan remains appropriate    Co-evaluation              AM-PAC PT "6 Clicks" Mobility   Outcome Measure  Help needed turning from your back to your side while in a flat bed without using bedrails?: None Help needed moving from lying on your back to sitting on the side of a flat bed without using bedrails?: None Help needed moving to and from a bed to a chair (including a wheelchair)?: A Little Help needed standing up from a chair using your arms (e.g., wheelchair or bedside chair)?: A Little Help needed to walk in hospital room?: A Little Help needed climbing 3-5 steps with a railing? : A Little 6 Click Score: 20    End of Session   Activity Tolerance: Patient tolerated treatment well Patient left: in chair;with call bell/phone within reach Nurse Communication: Mobility status PT Visit Diagnosis: Unsteadiness on feet (R26.81);Muscle weakness (generalized) (M62.81);Pain Pain - Right/Left: Left Pain - part of body: Knee     Time: 8144-8185 PT Time Calculation (min) (ACUTE ONLY): 24 min  Charges:  $Therapeutic Exercise: 8-22 mins $Therapeutic Activity: 8-22 mins                      Donald [name] PT, DPT 12/10/20, 10:21 AM

## 2020-12-12 ENCOUNTER — Ambulatory Visit (HOSPITAL_COMMUNITY): Payer: Medicare HMO | Admitting: Physician Assistant

## 2020-12-13 ENCOUNTER — Ambulatory Visit (HOSPITAL_COMMUNITY)
Admit: 2020-12-13 | Discharge: 2020-12-13 | Disposition: A | Discharge status: Home / Self Care | Payer: Medicare HMO | Attending: Physician Assistant | Admitting: Physician Assistant

## 2020-12-13 ENCOUNTER — Other Ambulatory Visit: Payer: Self-pay

## 2020-12-13 ENCOUNTER — Encounter (HOSPITAL_COMMUNITY): Payer: Self-pay | Admitting: Physician Assistant

## 2020-12-13 VITALS — BP 126/58 | HR 61 | Ht 65.0 in | Wt 169.8 lb

## 2020-12-13 DIAGNOSIS — Z905 Acquired absence of kidney: Secondary | ICD-10-CM | POA: Insufficient documentation

## 2020-12-13 DIAGNOSIS — I480 Paroxysmal atrial fibrillation: Secondary | ICD-10-CM | POA: Insufficient documentation

## 2020-12-13 DIAGNOSIS — Z85528 Personal history of other malignant neoplasm of kidney: Secondary | ICD-10-CM | POA: Insufficient documentation

## 2020-12-13 DIAGNOSIS — Z951 Presence of aortocoronary bypass graft: Secondary | ICD-10-CM | POA: Insufficient documentation

## 2020-12-13 DIAGNOSIS — E785 Hyperlipidemia, unspecified: Secondary | ICD-10-CM | POA: Insufficient documentation

## 2020-12-13 DIAGNOSIS — Z7901 Long term (current) use of anticoagulants: Secondary | ICD-10-CM | POA: Insufficient documentation

## 2020-12-13 DIAGNOSIS — D6869 Other thrombophilia: Secondary | ICD-10-CM | POA: Insufficient documentation

## 2020-12-13 DIAGNOSIS — I2510 Atherosclerotic heart disease of native coronary artery without angina pectoris: Secondary | ICD-10-CM | POA: Insufficient documentation

## 2020-12-13 DIAGNOSIS — Z79899 Other long term (current) drug therapy: Secondary | ICD-10-CM | POA: Insufficient documentation

## 2020-12-13 MED ORDER — AMIODARONE HCL 200 MG PO TABS: 200.0000 mg | ORAL_TABLET | Freq: Every day | ORAL | 3 refills | Status: DC

## 2020-12-13 NOTE — Progress Notes (Signed)
Primary Care Physician: Ma Hillock, DO Primary Cardiologist: Dr Irish Lack  Primary Electrophysiologist: Dr Rayann Heman (remotely) Referring Physician: Dr Hilda Blades is a 75 y.o. male with a history of atrial flutter s/p ablation, atrial fibrillation CAD s/p CABG 2001, HLD, renal cancer s/p L nephrectomy who presents for consultation in the [facility].  The patient was initially diagnosed with atrial fibrillation 12/02/20 after presenting for knee replacement surgery. He developed afib postop with rapid rates. He did report symptoms of dizziness when in afib with RVR. He was started on diltiazem which did convert him to SR but he had issues with hypotension. His afib recurred and he was loaded on amiodarone. He was also started on Eliquis for a CHADS2VASC score of 3. In hindsight, patient has had episodes of periodic dizziness before his knee surgery. He has done well since leaving this hospital. He is in [place] today. He denies any bleeding issues on anticoagulation.   Today, he denies symptoms of palpitations, chest pain, shortness of breath, orthopnea, PND, lower extremity edema, dizziness, presyncope, syncope, snoring, daytime somnolence, bleeding, or neurologic sequela. The patient is tolerating medications without difficulties and is otherwise without complaint today.    Atrial Fibrillation Risk Factors:  he does not have symptoms or diagnosis of sleep apnea. he does not have a history of rheumatic fever.   he has a BMI of Body mass index is 28.26 kg/m.Marland Kitchen Filed Weights   12/13/20 0929  Weight: 77 kg    Family History  Problem Relation Age of Onset   Heart attack Mother    Alzheimer's disease Mother    Cancer Father        throat cancer, chewing tobacco   Hypertension Neg Hx      Atrial Fibrillation Management history:  Previous antiarrhythmic drugs: amiodarone  Previous cardioversions: none Previous ablations: 2011 flutter ablation   CHADS2VASC score: 3 Anticoagulation history: Eliquis   Past Medical History:  Diagnosis Date   Age-related nuclear cataract, bilateral 06/29/2020   Allergic rhinitis    BPH (benign prostatic hyperplasia)    Chicken pox    Coronary artery disease    a. MI 2003 with CABG.   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    Osteoarthritis of knee 05/13/2017   Renal cell carcinoma ([place]) 02/04/2019   Partial nephrectomyKIDNEY, LEFT, PARTIAL NEPHRECTOMY:    Right ureteral stone    S/P ablation of atrial flutter    followed by dr Rayann Heman (EP cardiolgoist)  ablation 04-17-2004  and by dr allred 02-10- 2011 was successful   S/P CABG x 5 08-05-2001   in [place] , [place]   LIMA to LAD,  seqVG to Diagonal,  SeqVG to OM,  seqVG to PDA and PLA   Seasonal allergies    Sinus bradycardia    Wears glasses    Past Surgical History:  Procedure Laterality Date   CARDIAC CATHETERIZATION  08-04-2001  in [place], [place]   severe 3 vessel cad   CARDIAC ELECTROPHYSIOLOGY [place]  x2  04-17-2004 [place], [place]);  04-14-2009 by dr Rayann Heman   per dr allred report-- successful ablation clockwise  isthmus-dependent right atrial flutter along the usual cavotricupid isthmus;  complete bidirectionl isthmus block achieved   CORONARY ARTERY BYPASS GRAFT  08-06-2003  in [place], [place]   x5-- LIMA - LAD,  seqVG to Diagonal, OM, PDA, and PLA   CYSTOSCOPY WITH RETROGRADE PYELOGRAM, URETEROSCOPY AND STENT PLACEMENT Right 08/10/2016   Procedure: CYSTOSCOPY WITH RETROGRADE PYELOGRAM, URETEROSCOPY AND STENT PLACEMENT;  Surgeon: Alexis Frock, MD;  Location: [facility];  Service: Urology;  Laterality: Right;   FIXATION RIGHT WRIST Right 2011   stability for  arthritis   INGUINAL HERNIA REPAIR Right 08-08-2015   [facility]- health care at [place] Left 02/04/2019   Procedure: XI ROBOTIC ASSITED LEFT PARTIAL NEPHRECTOMY; LEFT PARTIAL ADRENALECTOMY;  Surgeon: Alexis Frock, MD;  Location: [facility];  Service: Urology;  Laterality: Left;  3 HRS   ROTATOR CUFF REPAIR Right 2004;  2007   TOTAL KNEE ARTHROPLASTY Left 11/30/2020   Procedure: TOTAL KNEE ARTHROPLASTY;  Surgeon: Susa Day, MD;  Location: [facility];  Service: Orthopedics;  Laterality: Left;   WISDOM TOOTH EXTRACTION      Current Outpatient Medications  Medication Sig Dispense Refill   acetaminophen (TYLENOL) 325 MG tablet Take 1-2 tablets (325-650 mg total) by mouth every 6 (six) hours as needed for mild pain (pain score 1-3 or temp > 100.5). 20 tablet 0   acidophilus (RISAQUAD) CAPS capsule Take 1 capsule by mouth daily. 30 capsule 0   apixaban (ELIQUIS) 5 MG TABS tablet Take 1 tablet (5 mg total) by mouth 2 (two) times daily. 60 tablet 0   cephALEXin (KEFLEX) 500 MG capsule Take 1 capsule (500 mg total) by mouth 4 (four) times daily for 10 days. 40 capsule 0   docusate sodium (COLACE) 100 MG capsule Take 1 capsule (100 mg total) by mouth 2 (two) times daily as needed for mild constipation. 30 capsule 1   HYDROcodone-acetaminophen (NORCO/VICODIN) 5-325 MG tablet Take 1 tablet by mouth every 4 (four) hours as needed for moderate pain or severe pain. 30 tablet 0   midodrine (PROAMATINE) 5 MG tablet Take 1 tablet (5 mg total) by mouth 3 (three) times daily with meals. 90 tablet 0   Multiple Vitamin (MULTIVITAMIN ADULT PO) Take 1 tablet by mouth daily.     ondansetron (ZOFRAN) 4 MG tablet Take 1 tablet (4 mg total) by mouth every 6 (six) hours as needed for nausea. 20 tablet 0   polyethylene glycol (MIRALAX / GLYCOLAX) 17 g packet Take 17 g by mouth daily. 14 each 0   amiodarone (PACERONE) 200 MG tablet Take 1 tablet (200 mg total) by mouth daily. 30  tablet 3   No current facility-administered medications for this encounter.    No Known Allergies  Social History   Socioeconomic History   Marital status: Married    Spouse name: Not on file   Number of children: Not on file   Years of education: Not on file   Highest education level: Not on file  Occupational History   Occupation: retired  Tobacco Use   Smoking status: Never   Smokeless tobacco: Current    Types: Snuff   Tobacco comments:    08-02-2016 ~ dip tobacco for 30 yrs  Vaping Use   Vaping Use: Never used  Substance and Sexual Activity   Alcohol use: Not Currently    Comment: occ.    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; Geneticist, molecular Situation: lives with wife      Spiritual Beliefs: Christian      Lifestyle: tries to make it to the gym 5 days per week; healthy diet     -smoke alarm in the home:Yes     - wears seatbelt: Yes     - Feels safe in their relationships: Yes         Social Determinants of Health   Financial Resource Strain: Low Risk    Difficulty of Paying Living Expenses: Not hard at all  Food Insecurity: No Food Insecurity   Worried About Charity fundraiser in the Last Year: Never true   [place] in the Last Year: Never true  Transportation Needs: No Transportation Needs   Lack of Transportation (Medical): No   Lack of Transportation (Non-Medical): No  Physical Activity: Sufficiently Active   Days of Exercise per Week: 4 days   Minutes of Exercise per Session: 60 min  Stress: No Stress Concern Present   Feeling of Stress : Not at all  Social Connections: Moderately Isolated   Frequency of Communication with Friends and Family: More than three times a week   Frequency of Social Gatherings with Friends and Family: More than three times a week   Attends Religious Services:  Never   Marine scientist or Organizations: No   Attends Music therapist: Never   Marital Status: Married  Human resources officer Violence: Not At Risk   Fear of Current or Ex-Partner: No   Emotionally Abused: No   Physically Abused: No   Sexually Abused: No     ROS- All systems are reviewed and negative except as per the HPI above.  Physical Exam: Vitals:   12/13/20 0929  BP: (!) 126/58  Pulse: 61  Weight: 77 kg  Height: 5\' 5"  (1.651 m)    GEN- The patient is a well appearing elderly male, alert and oriented x 3 today.   Head- normocephalic, atraumatic Eyes-  Sclera clear, conjunctiva pink Ears- hearing intact Oropharynx- clear Neck- supple  Lungs- Clear to ausculation bilaterally, normal work of breathing Heart- Regular rate and rhythm, no murmurs, rubs or gallops  GI- soft, NT, ND, + BS Extremities- no clubbing, cyanosis, or edema MS- no significant deformity or atrophy Skin- no rash or lesion Psych- euthymic mood, full affect Neuro- strength and sensation are intact  Wt Readings from Last 3 Encounters:  12/13/20 77 kg  11/30/20 79.5 kg  11/22/20 70.9 kg    EKG today demonstrates  SR, inc RBBB Vent. rate 61 BPM PR interval 148 ms QRS duration 114 ms QT/QTcB 488/491 ms  Echo 12/02/20 demonstrated   1. Left ventricular ejection fraction, by estimation, is 70 to 75%. The  left ventricle has hyperdynamic function. The left ventricle has no  regional wall motion abnormalities. Left ventricular diastolic function  could not be evaluated.   2. Right ventricular systolic function is normal. The right ventricular  size is normal.   3. The mitral valve is grossly normal. No evidence of mitral valve  regurgitation.   4. The aortic valve is tricuspid. Aortic valve regurgitation is not  visualized. No aortic stenosis is present.   5. The inferior vena cava is normal in size with <50% respiratory  variability, suggesting right atrial pressure of 8  mmHg.    Comparison(s): Changes from prior study are noted. 01/01/2020: LVEF  55-60%, normal atrial size.   Epic records are reviewed at length today  CHA2DS2-VASc Score = 3  The patient's score is based upon: CHF History: 0 HTN History: 0 Diabetes History: 0 Stroke History: 0 Vascular Disease History: 1 Age Score: 2 Gender Score: 0       ASSESSMENT AND PLAN: 1. Paroxysmal Atrial Fibrillation/atrial flutter The patient's CHA2DS2-VASc score is 3, indicating a 3.2% annual risk of stroke.   S/p flutter ablation 2011 Patient appears to be maintaining SR. Continue amiodarone 400 mg BID until 10/16 then decrease to 200 mg daily.  Continue Eliquis 5 mg BID Will not start rate control given h/o bradycardia and hypotension.   2. Secondary Hypercoagulable State (ICD10:  D68.69) The patient is at significant risk for stroke/thromboembolism based upon his CHA2DS2-VASc Score of 3.  Continue Apixaban (Eliquis).   3. CAD S/p CABG 2001 No anginal symptoms.     Follow up in the AF clinic in 3 months.    [facility] [address] [place], [place] [address] [phone] 12/13/2020 10:55 AM

## 2020-12-13 NOTE — Patient Instructions (Signed)
On Sunday, October 16th reduce amiodarone to 200mg  once a day

## 2020-12-14 DIAGNOSIS — Z4789 Encounter for other orthopedic aftercare: Secondary | ICD-10-CM | POA: Diagnosis not present

## 2020-12-14 DIAGNOSIS — Z471 Aftercare following joint replacement surgery: Secondary | ICD-10-CM | POA: Diagnosis not present

## 2020-12-15 DIAGNOSIS — M25562 Pain in left knee: Secondary | ICD-10-CM | POA: Diagnosis not present

## 2020-12-15 NOTE — Discharge Summary (Signed)
Physician Discharge Summary   Patient ID: Donald [name] MRN: [id number] DOB/AGE: August 27, 1945 75 y.o.  Admit date: 11/30/2020 Discharge date: 12/10/2020  Primary Diagnosis: left knee primary osteoarthritis  Admission Diagnoses:  Past Medical History:  Diagnosis Date   Age-related nuclear cataract, bilateral 06/29/2020   Allergic rhinitis    BPH (benign prostatic hyperplasia)    Chicken pox    Coronary artery disease    a. MI 2003 with CABG.   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) 02/04/2019   Partial nephrectomyKIDNEY, LEFT, PARTIAL NEPHRECTOMY:    Right ureteral stone    S/P ablation of atrial flutter    followed by dr Rayann Heman (EP cardiolgoist)  ablation 04-17-2004  and by dr Rayann Heman 02-10- 2011 was successful   S/P CABG x 5 08-05-2001   in [place] , [place]   LIMA to LAD,  seqVG to Diagonal,  SeqVG to OM,  seqVG to PDA and PLA   Seasonal allergies    Sinus bradycardia    Wears glasses    Discharge Diagnoses:   Active Problems:   S/P TKR (total knee replacement) using cement   Paroxysmal atrial fibrillation (HCC)  Estimated body mass index is 29.17 kg/m as calculated from the following:   Height as of this encounter: 5\' 5"  (1.651 m).   Weight as of this encounter: 79.5 kg.  Procedure:  Procedure(s) (LRB): TOTAL KNEE ARTHROPLASTY (Left)   Consults: cardiology and hospitalist  HPI: see H&P. Laboratory Data: Admission on 11/30/2020, Discharged on 12/10/2020  Component Date Value Ref Range Status   WBC 12/01/2020 12.9 (A) 4.0 - 10.5 K/uL Final   RBC 12/01/2020 3.53 (A) 4.22 - 5.81 MIL/uL Final   Hemoglobin 12/01/2020 10.7 (A) 13.0 - 17.0 g/dL  Final   HCT 12/01/2020 31.7 (A) 39.0 - 52.0 % Final   MCV 12/01/2020 89.8  80.0 - 100.0 fL Final   MCH 12/01/2020 30.3  26.0 - 34.0 pg Final   MCHC 12/01/2020 33.8  30.0 - 36.0 g/dL Final   RDW 12/01/2020 13.4  11.5 - 15.5 % Final   Platelets 12/01/2020 173  150 - 400 K/uL Final   nRBC 12/01/2020 0.0  0.0 - 0.2 % Final   Performed at [facility], [place] [address], [place], [place] [address]   Sodium 12/01/2020 136  135 - 145 mmol/L Final   Potassium 12/01/2020 4.3  3.5 - 5.1 mmol/L Final   Chloride 12/01/2020 106  98 - 111 mmol/L Final   CO2 12/01/2020 26  22 - 32 mmol/L Final   Glucose, Bld 12/01/2020 164 (A) 70 - 99 mg/dL Final   Glucose reference range applies only to samples taken after fasting for at least 8 hours.   BUN 12/01/2020 16  8 - 23 mg/dL Final   Creatinine, Ser 12/01/2020 0.86  0.61 - 1.24 mg/dL Final   Calcium 12/01/2020 8.6 (A) 8.9 - 10.3 mg/dL Final   GFR, Estimated 12/01/2020 >60  >60 mL/min Final   Comment: (NOTE) Calculated using the CKD-EPI Creatinine Equation (2021)    Anion gap 12/01/2020 4 (A) 5 -  15 Final   Performed at [facility], [place] [address] [place], [place] [address]   WBC 12/02/2020 10.9 (A) 4.0 - 10.5 K/uL Final   RBC 12/02/2020 3.57 (A) 4.22 - 5.81 MIL/uL Final   Hemoglobin 12/02/2020 10.7 (A) 13.0 - 17.0 g/dL Final   HCT 12/02/2020 32.0 (A) 39.0 - 52.0 % Final   MCV 12/02/2020 89.6  80.0 - 100.0 fL Final   MCH 12/02/2020 30.0  26.0 - 34.0 pg Final   MCHC 12/02/2020 33.4  30.0 - 36.0 g/dL Final   RDW 12/02/2020 13.6  11.5 - 15.5 % Final   Platelets 12/02/2020 153  150 - 400 K/uL Final   nRBC 12/02/2020 0.0  0.0 - 0.2 % Final   Performed at [facility], [place] [address], [place], [place] [address]   Sodium 12/02/2020 137  135 - 145 mmol/L Final   Potassium 12/02/2020 4.1  3.5 - 5.1 mmol/L Final   Chloride 12/02/2020 106  98 - 111 mmol/L Final   CO2 12/02/2020 24  22 - 32 mmol/L Final    Glucose, Bld 12/02/2020 106 (A) 70 - 99 mg/dL Final   Glucose reference range applies only to samples taken after fasting for at least 8 hours.   BUN 12/02/2020 14  8 - 23 mg/dL Final   Creatinine, Ser 12/02/2020 0.77  0.61 - 1.24 mg/dL Final   Calcium 12/02/2020 8.2 (A) 8.9 - 10.3 mg/dL Final   GFR, Estimated 12/02/2020 >60  >60 mL/min Final   Comment: (NOTE) Calculated using the CKD-EPI Creatinine Equation (2021)    Anion gap 12/02/2020 7  5 - 15 Final   Performed at [facility], [place] [address] [place], [place] [address]   Troponin I (High Sensitivity) 12/02/2020 117 (A) <18 ng/L Final   Comment: CRITICAL RESULT CALLED TO, READ BACK BY AND VERIFIED WITH: BHANDARI,K. RN @1453  ON 12/02/2020 BY COHEN,K (NOTE) Elevated high sensitivity troponin I (hsTnI) values and significant  changes across serial measurements may suggest ACS but many other  chronic and acute conditions are known to elevate hsTnI results.  Refer to the Links section for chest pain algorithms and additional  guidance. Performed at [facility], [place] [address], [place], [place] [address]    Troponin I (High Sensitivity) 12/02/2020 180 (A) <18 ng/L Final   Comment: DELTA CHECK NOTED CRITICAL RESULT CALLED TO, READ BACK BY AND VERIFIED WITH: BHANDARI,N. RN @1621  ON 12/02/2020 BY COHEN,K (NOTE) Elevated high sensitivity troponin I (hsTnI) values and significant  changes across serial measurements may suggest ACS but many other  chronic and acute conditions are known to elevate hsTnI results.  Refer to the Links section for chest pain algorithms and additional  guidance. Performed at [facility], [place] [address] [place], [place] [address]    Weight 12/02/2020 2,409.73  oz Final   Height 12/02/2020 65  in Final   BP 12/02/2020 111/56  mmHg Final   WBC 12/03/2020 10.7 (A) 4.0 - 10.5 K/uL Final   RBC 12/03/2020 3.43 (A) 4.22 - 5.81 MIL/uL Final   Hemoglobin  12/03/2020 10.4 (A) 13.0 - 17.0 g/dL Final   HCT 12/03/2020 31.3 (A) 39.0 - 52.0 % Final   MCV 12/03/2020 91.3  80.0 - 100.0 fL Final   MCH 12/03/2020 30.3  26.0 - 34.0 pg Final   MCHC 12/03/2020 33.2  30.0 - 36.0 g/dL Final   RDW 12/03/2020 13.9  11.5 - 15.5 % Final  Platelets 12/03/2020 166  150 - 400 K/uL Final   nRBC 12/03/2020 0.0  0.0 - 0.2 % Final   Performed at [facility], [place] [address] [place], [place] [address]   TSH 12/05/2020 1.118  0.350 - 4.500 uIU/mL Final   Comment: Performed by a 3rd Generation assay with a functional sensitivity of <=0.01 uIU/mL. Performed at [facility], [place] [address], [place], [place] [address]    Total Protein 12/05/2020 5.9 (A) 6.5 - 8.1 g/dL Final   Albumin 12/05/2020 2.7 (A) 3.5 - 5.0 g/dL Final   AST 12/05/2020 21  15 - 41 U/L Final   ALT 12/05/2020 13  0 - 44 U/L Final   Alkaline Phosphatase 12/05/2020 55  38 - 126 U/L Final   Total Bilirubin 12/05/2020 1.0  0.3 - 1.2 mg/dL Final   Bilirubin, Direct 12/05/2020 0.2  0.0 - 0.2 mg/dL Final   Indirect Bilirubin 12/05/2020 0.8  0.3 - 0.9 mg/dL Final   Performed at [facility], [place] [address], [place], [place] [address]   WBC 12/07/2020 9.9  4.0 - 10.5 K/uL Final   RBC 12/07/2020 2.94 (A) 4.22 - 5.81 MIL/uL Final   Hemoglobin 12/07/2020 8.9 (A) 13.0 - 17.0 g/dL Final   HCT 12/07/2020 26.3 (A) 39.0 - 52.0 % Final   MCV 12/07/2020 89.5  80.0 - 100.0 fL Final   MCH 12/07/2020 30.3  26.0 - 34.0 pg Final   MCHC 12/07/2020 33.8  30.0 - 36.0 g/dL Final   RDW 12/07/2020 13.6  11.5 - 15.5 % Final   Platelets 12/07/2020 279  150 - 400 K/uL Final   nRBC 12/07/2020 0.0  0.0 - 0.2 % Final   Neutrophils Relative % 12/07/2020 76  % Final   Neutro Abs 12/07/2020 7.6  1.7 - 7.7 K/uL Final   Lymphocytes Relative 12/07/2020 12  % Final   Lymphs Abs 12/07/2020 1.2  0.7 - 4.0 K/uL Final   Monocytes Relative 12/07/2020 9  % Final   Monocytes Absolute  12/07/2020 0.9  0.1 - 1.0 K/uL Final   Eosinophils Relative 12/07/2020 2  % Final   Eosinophils Absolute 12/07/2020 0.2  0.0 - 0.5 K/uL Final   Basophils Relative 12/07/2020 0  % Final   Basophils Absolute 12/07/2020 0.0  0.0 - 0.1 K/uL Final   Immature Granulocytes 12/07/2020 1  % Final   Abs Immature Granulocytes 12/07/2020 0.05  0.00 - 0.07 K/uL Final   Performed at [facility], [place] [address] [place], [place] [address]   Sodium 12/07/2020 143  135 - 145 mmol/L Final   Potassium 12/07/2020 4.3  3.5 - 5.1 mmol/L Final   Chloride 12/07/2020 109  98 - 111 mmol/L Final   CO2 12/07/2020 27  22 - 32 mmol/L Final   Glucose, Bld 12/07/2020 124 (A) 70 - 99 mg/dL Final   Glucose reference range applies only to samples taken after fasting for at least 8 hours.   BUN 12/07/2020 21  8 - 23 mg/dL Final   Creatinine, Ser 12/07/2020 0.90  0.61 - 1.24 mg/dL Final   Calcium 12/07/2020 8.5 (A) 8.9 - 10.3 mg/dL Final   Total Protein 12/07/2020 5.8 (A) 6.5 - 8.1 g/dL Final   Albumin 12/07/2020 2.5 (A) 3.5 - 5.0 g/dL Final   AST 12/07/2020 23  15 - 41 U/L Final   ALT 12/07/2020 18  0 - 44 U/L Final   Alkaline Phosphatase 12/07/2020 65  38 - 126  U/L Final   Total Bilirubin 12/07/2020 0.9  0.3 - 1.2 mg/dL Final   GFR, Estimated 12/07/2020 >60  >60 mL/min Final   Comment: (NOTE) Calculated using the CKD-EPI Creatinine Equation (2021)    Anion gap 12/07/2020 7  5 - 15 Final   Performed at [facility], [place] [address], [place], [place] [address]   Magnesium 12/07/2020 1.8  1.7 - 2.4 mg/dL Final   Performed at [place] [address], [place], [place] [address]   Phosphorus 12/07/2020 3.6  2.5 - 4.6 mg/dL Final   Performed at [place] [address], [place], [place] [address]   WBC 12/09/2020 7.8  4.0 - 10.5 K/uL Final   RBC 12/09/2020 2.82 (A) 4.22 - 5.81 MIL/uL Final   Hemoglobin 12/09/2020 8.4 (A) 13.0 - 17.0 g/dL  Final   HCT 12/09/2020 25.2 (A) 39.0 - 52.0 % Final   MCV 12/09/2020 89.4  80.0 - 100.0 fL Final   MCH 12/09/2020 29.8  26.0 - 34.0 pg Final   MCHC 12/09/2020 33.3  30.0 - 36.0 g/dL Final   RDW 12/09/2020 13.5  11.5 - 15.5 % Final   Platelets 12/09/2020 315  150 - 400 K/uL Final   nRBC 12/09/2020 0.0  0.0 - 0.2 % Final   Neutrophils Relative % 12/09/2020 78  % Final   Neutro Abs 12/09/2020 6.2  1.7 - 7.7 K/uL Final   Lymphocytes Relative 12/09/2020 10  % Final   Lymphs Abs 12/09/2020 0.7  0.7 - 4.0 K/uL Final   Monocytes Relative 12/09/2020 7  % Final   Monocytes Absolute 12/09/2020 0.5  0.1 - 1.0 K/uL Final   Eosinophils Relative 12/09/2020 4  % Final   Eosinophils Absolute 12/09/2020 0.3  0.0 - 0.5 K/uL Final   Basophils Relative 12/09/2020 0  % Final   Basophils Absolute 12/09/2020 0.0  0.0 - 0.1 K/uL Final   Immature Granulocytes 12/09/2020 1  % Final   Abs Immature Granulocytes 12/09/2020 0.04  0.00 - 0.07 K/uL Final   Performed at [facility], [place] [address], [place], [place] [address]   Sodium 12/09/2020 141  135 - 145 mmol/L Final   Potassium 12/09/2020 3.9  3.5 - 5.1 mmol/L Final   Chloride 12/09/2020 113 (A) 98 - 111 mmol/L Final   CO2 12/09/2020 24  22 - 32 mmol/L Final   Glucose, Bld 12/09/2020 114 (A) 70 - 99 mg/dL Final   Glucose reference range applies only to samples taken after fasting for at least 8 hours.   BUN 12/09/2020 16  8 - 23 mg/dL Final   Creatinine, Ser 12/09/2020 0.80  0.61 - 1.24 mg/dL Final   Calcium 12/09/2020 8.1 (A) 8.9 - 10.3 mg/dL Final   Total Protein 12/09/2020 5.4 (A) 6.5 - 8.1 g/dL Final   Albumin 12/09/2020 2.4 (A) 3.5 - 5.0 g/dL Final   AST 12/09/2020 24  15 - 41 U/L Final   ALT 12/09/2020 22  0 - 44 U/L Final   Alkaline Phosphatase 12/09/2020 60  38 - 126 U/L Final   Total Bilirubin 12/09/2020 0.9  0.3 - 1.2 mg/dL Final   GFR, Estimated 12/09/2020 >60  >60 mL/min Final   Comment: (NOTE) Calculated using the CKD-EPI  Creatinine Equation (2021)    Anion gap 12/09/2020 4 (A) 5 - 15 Final   Performed at [facility], [place] [address], [place], [place] [address]   Magnesium 12/09/2020 2.0  1.7 -  2.4 mg/dL Final   Performed at [place] [address], [place], [place] [address]   Phosphorus 12/09/2020 3.1  2.5 - 4.6 mg/dL Final   Performed at [place] [address] [place], [place] [address]   Sodium 12/10/2020 144  135 - 145 mmol/L Final   Potassium 12/10/2020 4.5  3.5 - 5.1 mmol/L Final   Chloride 12/10/2020 112 (A) 98 - 111 mmol/L Final   CO2 12/10/2020 26  22 - 32 mmol/L Final   Glucose, Bld 12/10/2020 106 (A) 70 - 99 mg/dL Final   Glucose reference range applies only to samples taken after fasting for at least 8 hours.   BUN 12/10/2020 14  8 - 23 mg/dL Final   Creatinine, Ser 12/10/2020 0.98  0.61 - 1.24 mg/dL Final   Calcium 12/10/2020 8.3 (A) 8.9 - 10.3 mg/dL Final   Total Protein 12/10/2020 5.4 (A) 6.5 - 8.1 g/dL Final   Albumin 12/10/2020 2.4 (A) 3.5 - 5.0 g/dL Final   AST 12/10/2020 22  15 - 41 U/L Final   ALT 12/10/2020 21  0 - 44 U/L Final   Alkaline Phosphatase 12/10/2020 61  38 - 126 U/L Final   Total Bilirubin 12/10/2020 0.9  0.3 - 1.2 mg/dL Final   GFR, Estimated 12/10/2020 >60  >60 mL/min Final   Comment: (NOTE) Calculated using the CKD-EPI Creatinine Equation (2021)    Anion gap 12/10/2020 6  5 - 15 Final   Performed at [facility], [place] [address] [place], [place] [address]   WBC 12/10/2020 9.6  4.0 - 10.5 K/uL Final   RBC 12/10/2020 2.86 (A) 4.22 - 5.81 MIL/uL Final   Hemoglobin 12/10/2020 8.3 (A) 13.0 - 17.0 g/dL Final   HCT 12/10/2020 25.7 (A) 39.0 - 52.0 % Final   MCV 12/10/2020 89.9  80.0 - 100.0 fL Final   MCH 12/10/2020 29.0  26.0 - 34.0 pg Final   MCHC 12/10/2020 32.3  30.0 - 36.0 g/dL Final   RDW 12/10/2020 13.6  11.5 - 15.5 % Final   Platelets 12/10/2020 375  150 - 400 K/uL Final    nRBC 12/10/2020 0.0  0.0 - 0.2 % Final   Neutrophils Relative % 12/10/2020 74  % Final   Neutro Abs 12/10/2020 7.2  1.7 - 7.7 K/uL Final   Lymphocytes Relative 12/10/2020 13  % Final   Lymphs Abs 12/10/2020 1.2  0.7 - 4.0 K/uL Final   Monocytes Relative 12/10/2020 7  % Final   Monocytes Absolute 12/10/2020 0.7  0.1 - 1.0 K/uL Final   Eosinophils Relative 12/10/2020 4  % Final   Eosinophils Absolute 12/10/2020 0.3  0.0 - 0.5 K/uL Final   Basophils Relative 12/10/2020 1  % Final   Basophils Absolute 12/10/2020 0.1  0.0 - 0.1 K/uL Final   Immature Granulocytes 12/10/2020 1  % Final   Abs Immature Granulocytes 12/10/2020 0.05  0.00 - 0.07 K/uL Final   Performed at [facility], [place] [address], [place], [place] [address]   Magnesium 12/10/2020 2.0  1.7 - 2.4 mg/dL Final   Performed at [place] [address] [place], [place] [address]   Phosphorus 12/10/2020 3.5  2.5 - 4.6 mg/dL Final   Performed at [place] [address] [place], [place] [address]   Vitamin B-12 12/10/2020 355  180 - 914 pg/mL Final   Comment: (NOTE) This assay is not validated for testing neonatal or myeloproliferative  syndrome specimens for Vitamin B12 levels. Performed at [facility], [place] [address] [place], [place] [address]    Folate 12/10/2020 21.9  >5.9 ng/mL Final   Performed at [facility], [place] [address], [place], [place] [address]   Iron 12/10/2020 29 (A) 45 - 182 ug/dL Final   TIBC 12/10/2020 162 (A) 250 - 450 ug/dL Final   Saturation Ratios 12/10/2020 18  17.9 - 39.5 % Final   UIBC 12/10/2020 133  ug/dL Final   Performed at [facility], [place] [address] [place], [place] [address]   Ferritin 12/10/2020 327  24 - 336 ng/mL Final   Performed at [place] [address], [place], [place] [address]   Retic Ct Pct 12/10/2020 2.1  0.4 - 3.1 % Final   RBC.  12/10/2020 2.82 (A) 4.22 - 5.81 MIL/uL Final   Retic Count, Absolute 12/10/2020 58.1  19.0 - 186.0 K/uL Final   Immature Retic Fract 12/10/2020 23.3 (A) 2.3 - 15.9 % Final   Performed at [facility], [place] [address], [place], [place] [address]  Orders Only on 11/28/2020  Component Date Value Ref Range Status   SARS Coronavirus 2 11/28/2020 RESULT: NEGATIVE   Final   Comment: RESULT: NEGATIVESARS-CoV-2 INTERPRETATION:A NEGATIVE  test result means that SARS-CoV-2 RNA was not present in the specimen above the limit of detection of this test. This does not preclude a possible SARS-CoV-2 infection and should not be used as the  sole basis for patient management decisions. Negative results must be combined with clinical observations, patient history, and epidemiological information. Optimum specimen types and timing for peak viral levels during infections caused by SARS-CoV-2  have not been determined. Collection of multiple specimens or types of specimens may be necessary to detect virus. Improper specimen collection and handling, sequence variability under primers/probes, or organism present below the limit of detection may  lead to false negative results. Positive and negative predictive values of testing are highly dependent on prevalence. False negative test results are more likely when prevalence of disease is high.The expected result is NEGATIVE.Fact S                          heet for  Healthcare Providers: LocalChronicle.no Sheet for Patients: SalonLookup.es Reference Range - Negative   Hospital Outpatient Visit on 11/22/2020  Component Date Value Ref Range Status   MRSA, PCR 11/22/2020 NEGATIVE  NEGATIVE Final   Staphylococcus aureus 11/22/2020 NEGATIVE  NEGATIVE Final   Comment: (NOTE) The Xpert SA Assay (FDA approved for NASAL specimens in patients 74 years of age and older), is one component of a  comprehensive surveillance program. It is not intended to diagnose infection nor to guide or monitor treatment. Performed at [facility], [place] [address], [place], [place] [address]    WBC 11/22/2020 5.8  4.0 - 10.5 K/uL Final   RBC 11/22/2020 4.39  4.22 - 5.81 MIL/uL Final   Hemoglobin 11/22/2020 13.2  13.0 - 17.0 g/dL Final   HCT 11/22/2020 40.1  39.0 - 52.0 % Final   MCV 11/22/2020 91.3  80.0 - 100.0 fL Final   MCH 11/22/2020 30.1  26.0 - 34.0 pg Final   MCHC 11/22/2020 32.9  30.0 - 36.0 g/dL Final   RDW 11/22/2020 13.1  11.5 - 15.5 % Final   Platelets 11/22/2020 218  150 - 400 K/uL Final   nRBC 11/22/2020 0.0  0.0 -  0.2 % Final   Performed at [facility], [place] [address], [place], [place] [address]   Sodium 11/22/2020 144  135 - 145 mmol/L Final   Potassium 11/22/2020 4.3  3.5 - 5.1 mmol/L Final   Chloride 11/22/2020 109  98 - 111 mmol/L Final   CO2 11/22/2020 27  22 - 32 mmol/L Final   Glucose, Bld 11/22/2020 98  70 - 99 mg/dL Final   Glucose reference range applies only to samples taken after fasting for at least 8 hours.   BUN 11/22/2020 22  8 - 23 mg/dL Final   Creatinine, Ser 11/22/2020 0.88  0.61 - 1.24 mg/dL Final   Calcium 11/22/2020 9.6  8.9 - 10.3 mg/dL Final   GFR, Estimated 11/22/2020 >60  >60 mL/min Final   Comment: (NOTE) Calculated using the CKD-EPI Creatinine Equation (2021)    Anion gap 11/22/2020 8  5 - 15 Final   Performed at [facility], [place] [address], [place], [place] [address]   aPTT 11/22/2020 28  24 - 36 seconds Final   Performed at [facility], [place] [address], [place], [place] [address]   Prothrombin Time 11/22/2020 13.1  11.4 - 15.2 seconds Final   INR 11/22/2020 1.0  0.8 - 1.2 Final   Comment: (NOTE) INR goal varies based on device and disease states. Performed at [facility], [place] [address], [place], [place] [address]    Color, Urine 11/22/2020  YELLOW  YELLOW Final   APPearance 11/22/2020 CLEAR  CLEAR Final   Specific Gravity, Urine 11/22/2020 1.024  1.005 - 1.030 Final   pH 11/22/2020 6.0  5.0 - 8.0 Final   Glucose, UA 11/22/2020 NEGATIVE  NEGATIVE mg/dL Final   Hgb urine dipstick 11/22/2020 NEGATIVE  NEGATIVE Final   Bilirubin Urine 11/22/2020 NEGATIVE  NEGATIVE Final   Ketones, ur 11/22/2020 NEGATIVE  NEGATIVE mg/dL Final   Protein, ur 11/22/2020 NEGATIVE  NEGATIVE mg/dL Final   Nitrite 11/22/2020 NEGATIVE  NEGATIVE Final   Leukocytes,Ua 11/22/2020 NEGATIVE  NEGATIVE Final   RBC / HPF 11/22/2020 0-5  0 - 5 RBC/hpf Final   WBC, UA 11/22/2020 0-5  0 - 5 WBC/hpf Final   Bacteria, UA 11/22/2020 NONE SEEN  NONE SEEN Final   Mucus 11/22/2020 PRESENT   Final   Performed at [facility], [place] [address], [place], [place] [address]     X-Rays:DG Knee 1-2 Views Left  Result Date: 11/30/2020 CLINICAL DATA:  Post left knee replacement EXAM: LEFT KNEE - 1-2 VIEW COMPARISON:  None. FINDINGS: Changes of left knee replacement. Soft tissue and joint space gas. No hardware bony complicating feature. IMPRESSION: Left knee replacement.  No visible complicating feature. Electronically Signed   By: Rolm Baptise M.D.   On: 11/30/2020 13:20   ECHOCARDIOGRAM LIMITED  Result Date: 12/02/2020    ECHOCARDIOGRAM REPORT   Patient Name:   Donald [name] Date of Exam: 12/02/2020 Medical Rec #:  [id number]     Height:       65.0 in Accession #:    [id number]    Weight:       175.3 lb Date of Birth:  08-23-1945      BSA:          1.870 m Patient Age:    21 years      BP:           111/56 mmHg Patient Gender: M  HR:           130 bpm. Exam Location:  Inpatient Procedure: Limited Echo and Color Doppler Indications:    I48.91* Unspecified atrial fibrillation  History:        Patient has prior history of Echocardiogram examinations, most                 recent 01/01/2020. Prior CABG, Arrythmias:Atrial Fibrillation;                 Risk  Factors:Dyslipidemia.  Sonographer:    Raquel Sarna Senior RDCS Referring Phys: [id number] [place]  1. Left ventricular ejection fraction, by estimation, is 70 to 75%. The left ventricle has hyperdynamic function. The left ventricle has no regional wall motion abnormalities. Left ventricular diastolic function could not be evaluated.  2. Right ventricular systolic function is normal. The right ventricular size is normal.  3. The mitral valve is grossly normal. No evidence of mitral valve regurgitation.  4. The aortic valve is tricuspid. Aortic valve regurgitation is not visualized. No aortic stenosis is present.  5. The inferior vena cava is normal in size with <50% respiratory variability, suggesting right atrial pressure of 8 mmHg. Comparison(s): Changes from prior study are noted. 01/01/2020: LVEF 55-60%, normal atrial size. FINDINGS  Left Ventricle: Left ventricular ejection fraction, by estimation, is 70 to 75%. The left ventricle has hyperdynamic function. The left ventricle has no regional wall motion abnormalities. The left ventricular internal cavity size was normal in size. There is borderline left ventricular hypertrophy. Left ventricular diastolic function could not be evaluated due to atrial fibrillation. Left ventricular diastolic function could not be evaluated. Right Ventricle: The right ventricular size is normal. No increase in right ventricular wall thickness. Right ventricular systolic function is normal. Left Atrium: Left atrial size was normal in size. Right Atrium: Right atrial size was normal in size. Pericardium: There is no evidence of pericardial effusion. Mitral Valve: The mitral valve is grossly normal. No evidence of mitral valve regurgitation. Tricuspid Valve: The tricuspid valve is grossly normal. Tricuspid valve regurgitation is trivial. Aortic Valve: The aortic valve is tricuspid. Aortic valve regurgitation is not visualized. No aortic stenosis is present. Pulmonic Valve:  The pulmonic valve was grossly normal. Pulmonic valve regurgitation is not visualized. Aorta: The aortic root and ascending aorta are structurally normal, with no evidence of dilitation. Venous: The inferior vena cava is normal in size with less than 50% respiratory variability, suggesting right atrial pressure of 8 mmHg. IAS/Shunts: No atrial level shunt detected by color flow Doppler. EKG: Rhythm strip during this exam demostrated AFib wtih RVR. Lyman Bishop MD Electronically signed by Lyman Bishop MD Signature Date/Time: 12/02/2020/3:56:58 PM    Final     EKG: Orders placed or performed during the hospital encounter of 11/30/20   EKG 12-Lead   EKG 12-Lead   EKG 12-Lead   EKG 12-Lead   EKG 12-Lead   EKG 12-Lead     Hospital Course: Donald [name] is a 75 y.o. who was admitted to [facility] [facility] They were brought to the operating room on 11/30/2020 and underwent Procedure(s): TOTAL KNEE ARTHROPLASTY.  Patient tolerated the procedure well and was later transferred to the recovery room and then to the orthopaedic floor for postoperative care.  They were given PO and IV analgesics for pain control following their surgery.  They were given 24 hours of postoperative antibiotics of  Anti-infectives (From admission, onward)    Start     Dose/Rate  Route Frequency Ordered Stop   12/09/20 1430  cefTRIAXone (ROCEPHIN) injection 1 g  Status:  Discontinued        1 g Intramuscular Every 24 hours 12/09/20 1331 12/10/20 2049   12/09/20 0000  cephALEXin (KEFLEX) 500 MG capsule        500 mg Oral 4 times daily 12/09/20 1648 12/19/20 2359   11/30/20 1500  ceFAZolin (ANCEF) IVPB 2g/100 mL premix        2 g 200 mL/hr over 30 Minutes Intravenous Every 6 hours 11/30/20 1418 11/30/20 2136   11/30/20 0600  ceFAZolin (ANCEF) IVPB 2g/100 mL premix        2 g 200 mL/hr over 30 Minutes Intravenous On call to O.R. 11/30/20 0533 11/30/20 0735      and started on DVT prophylaxis in the form of Aspirin, TED hose,  and SCDs .   PT and OT were ordered for total joint protocol.  Discharge planning consulted to help with postop disposition and equipment needs.  Patient had a fair night on the evening of surgery.  They started to get up OOB with therapy on day one, but did experience episodes of dizziness and one run of tachycardia when up with PT. Bolus given. Continued to work with therapy into day two, however had repeat episodes of tachycardia. EKG was done and was significant for AFib, pt apparently has a hx and had been experiencing episodes at home but they were unreported to [place] or his cardiologist.  Hospitalist and cardiology consulted. He was started on eliquis and medications changed to control his AFib, although this took several days and attempts. He also developed cellulitis on his hand and arm delaying his discharge, was placed on IV abx during his stay then converted to PO abx prior to D/C.  By day 10, the patient had progressed with therapy and meeting their goals.  Incision was healing well.  Patient was seen in rounds daily and was ready to go home. Outpatient therapy was arranged.   Diet: Regular diet Activity:WBAT Follow-up:10-14 days post-op Disposition - Home Discharged Condition: good   Discharge Instructions     Call MD / Call 911   Complete by: As directed    If you experience chest pain or shortness of breath, CALL 911 and be transported to the hospital emergency room.  If you develope a fever above 101 F, pus (white drainage) or increased drainage or redness at the wound, or calf pain, call your surgeon's office.   Call MD / Call 911   Complete by: As directed    If you experience chest pain or shortness of breath, CALL 911 and be transported to the hospital emergency room.  If you develope a fever above 101 F, pus (white drainage) or increased drainage or redness at the wound, or calf pain, call your surgeon's office.   Constipation Prevention   Complete by: As directed    Drink  plenty of fluids.  Prune juice may be helpful.  You may use a stool softener, such as Colace (over the counter) 100 mg twice a day.  Use MiraLax (over the counter) for constipation as needed.   Constipation Prevention   Complete by: As directed    Drink plenty of fluids.  Prune juice may be helpful.  You may use a stool softener, such as Colace (over the counter) 100 mg twice a day.  Use MiraLax (over the counter) for constipation as needed.   Diet - low sodium heart healthy  Complete by: As directed    Diet - low sodium heart healthy   Complete by: As directed    Increase activity slowly as tolerated   Complete by: As directed    Increase activity slowly as tolerated   Complete by: As directed    Post-operative opioid taper instructions:   Complete by: As directed    POST-OPERATIVE OPIOID TAPER INSTRUCTIONS: It is important to wean off of your opioid medication as soon as possible. If you do not need pain medication after your surgery it is ok to stop day one. Opioids include: Codeine, Hydrocodone(Norco, Vicodin), Oxycodone(Percocet, oxycontin) and hydromorphone amongst others.  Long term and even short term use of opiods can cause: Increased pain response Dependence Constipation Depression Respiratory depression And more.  Withdrawal symptoms can include Flu like symptoms Nausea, vomiting And more Techniques to manage these symptoms Hydrate well Eat regular healthy meals Stay active Use relaxation techniques(deep breathing, meditating, yoga) Do Not substitute Alcohol to help with tapering If you have been on opioids for less than two weeks and do not have pain than it is ok to stop all together.  Plan to wean off of opioids This plan should start within one week post op of your joint replacement. Maintain the same interval or time between taking each dose and first decrease the dose.  Cut the total daily intake of opioids by one tablet each day Next start to increase the  time between doses. The last dose that should be eliminated is the evening dose.      Post-operative opioid taper instructions:   Complete by: As directed    POST-OPERATIVE OPIOID TAPER INSTRUCTIONS: It is important to wean off of your opioid medication as soon as possible. If you do not need pain medication after your surgery it is ok to stop day one. Opioids include: Codeine, Hydrocodone(Norco, Vicodin), Oxycodone(Percocet, oxycontin) and hydromorphone amongst others.  Long term and even short term use of opiods can cause: Increased pain response Dependence Constipation Depression Respiratory depression And more.  Withdrawal symptoms can include Flu like symptoms Nausea, vomiting And more Techniques to manage these symptoms Hydrate well Eat regular healthy meals Stay active Use relaxation techniques(deep breathing, meditating, yoga) Do Not substitute Alcohol to help with tapering If you have been on opioids for less than two weeks and do not have pain than it is ok to stop all together.  Plan to wean off of opioids This plan should start within one week post op of your joint replacement. Maintain the same interval or time between taking each dose and first decrease the dose.  Cut the total daily intake of opioids by one tablet each day Next start to increase the time between doses. The last dose that should be eliminated is the evening dose.         Allergies as of 12/10/2020   No Known Allergies      Medication List     STOP taking these medications    aspirin 81 MG chewable tablet   atorvastatin 40 MG tablet Commonly known as: LIPITOR   Fish Oil 1000 MG Caps   ibuprofen 800 MG tablet Commonly known as: ADVIL   Red Yeast Rice 600 MG Caps       TAKE these medications    acetaminophen 325 MG tablet Commonly known as: TYLENOL Take 1-2 tablets (325-650 mg total) by mouth every 6 (six) hours as needed for mild pain (pain score 1-3 or temp > 100.5).  acidophilus Caps capsule Take 1 capsule by mouth daily.   apixaban 5 MG Tabs tablet Commonly known as: ELIQUIS Take 1 tablet (5 mg total) by mouth 2 (two) times daily.   cephALEXin 500 MG capsule Commonly known as: KEFLEX Take 1 capsule (500 mg total) by mouth 4 (four) times daily for 10 days.   docusate sodium 100 MG capsule Commonly known as: Colace Take 1 capsule (100 mg total) by mouth 2 (two) times daily as needed for mild constipation.   HYDROcodone-acetaminophen 5-325 MG tablet Commonly known as: NORCO/VICODIN Take 1 tablet by mouth every 4 (four) hours as needed for moderate pain or severe pain.   midodrine 5 MG tablet Commonly known as: PROAMATINE Take 1 tablet (5 mg total) by mouth 3 (three) times daily with meals. What changed:  medication strength how much to take when to take this reasons to take this   MULTIVITAMIN ADULT PO Take 1 tablet by mouth daily.   ondansetron 4 MG tablet Commonly known as: ZOFRAN Take 1 tablet (4 mg total) by mouth every 6 (six) hours as needed for nausea.   polyethylene glycol 17 g packet Commonly known as: MIRALAX / GLYCOLAX Take 17 g by mouth daily.        Follow-up Information     Susa Day, MD. Go on 12/14/2020.   Specialty: Orthopedic Surgery Why: You are scheduled for a follow up appointment on 12-14-20 at 2:15 pm. Contact information: [address] [place] [address] [place] Follow up.   Specialty: Cardiology Why: The atrial fib clinic will call you to arrange a follow-up visit. Contact information: [address] [id number] [place] [phone]                Signed: Lacie Draft, PA-C Orthopaedic Surgery 12/15/2020, 7:44 AM

## 2020-12-19 DIAGNOSIS — M25562 Pain in left knee: Secondary | ICD-10-CM | POA: Diagnosis not present

## 2020-12-21 DIAGNOSIS — M25562 Pain in left knee: Secondary | ICD-10-CM | POA: Diagnosis not present

## 2020-12-23 DIAGNOSIS — M25562 Pain in left knee: Secondary | ICD-10-CM | POA: Diagnosis not present

## 2020-12-26 DIAGNOSIS — M25562 Pain in left knee: Secondary | ICD-10-CM | POA: Diagnosis not present

## 2020-12-28 DIAGNOSIS — M25562 Pain in left knee: Secondary | ICD-10-CM | POA: Diagnosis not present

## 2020-12-30 DIAGNOSIS — M25562 Pain in left knee: Secondary | ICD-10-CM | POA: Diagnosis not present

## 2021-01-03 DIAGNOSIS — M25562 Pain in left knee: Secondary | ICD-10-CM | POA: Diagnosis not present

## 2021-01-05 DIAGNOSIS — M25562 Pain in left knee: Secondary | ICD-10-CM | POA: Diagnosis not present

## 2021-01-10 DIAGNOSIS — Z4789 Encounter for other orthopedic aftercare: Secondary | ICD-10-CM | POA: Diagnosis not present

## 2021-01-10 DIAGNOSIS — M25562 Pain in left knee: Secondary | ICD-10-CM | POA: Diagnosis not present

## 2021-01-12 DIAGNOSIS — I2510 Atherosclerotic heart disease of native coronary artery without angina pectoris: Secondary | ICD-10-CM | POA: Diagnosis not present

## 2021-01-12 DIAGNOSIS — R030 Elevated blood-pressure reading, without diagnosis of hypertension: Secondary | ICD-10-CM | POA: Diagnosis not present

## 2021-01-12 DIAGNOSIS — E785 Hyperlipidemia, unspecified: Secondary | ICD-10-CM | POA: Diagnosis not present

## 2021-01-12 DIAGNOSIS — R32 Unspecified urinary incontinence: Secondary | ICD-10-CM | POA: Diagnosis not present

## 2021-01-12 DIAGNOSIS — M1990 Unspecified osteoarthritis, unspecified site: Secondary | ICD-10-CM | POA: Diagnosis not present

## 2021-01-12 DIAGNOSIS — M810 Age-related osteoporosis without current pathological fracture: Secondary | ICD-10-CM | POA: Diagnosis not present

## 2021-01-12 DIAGNOSIS — Z809 Family history of malignant neoplasm, unspecified: Secondary | ICD-10-CM | POA: Diagnosis not present

## 2021-01-12 DIAGNOSIS — D6869 Other thrombophilia: Secondary | ICD-10-CM | POA: Diagnosis not present

## 2021-01-12 DIAGNOSIS — Z008 Encounter for other general examination: Secondary | ICD-10-CM | POA: Diagnosis not present

## 2021-01-12 DIAGNOSIS — N529 Male erectile dysfunction, unspecified: Secondary | ICD-10-CM | POA: Diagnosis not present

## 2021-01-12 DIAGNOSIS — Z7901 Long term (current) use of anticoagulants: Secondary | ICD-10-CM | POA: Diagnosis not present

## 2021-01-12 DIAGNOSIS — Z791 Long term (current) use of non-steroidal anti-inflammatories (NSAID): Secondary | ICD-10-CM | POA: Diagnosis not present

## 2021-01-12 DIAGNOSIS — I4891 Unspecified atrial fibrillation: Secondary | ICD-10-CM | POA: Diagnosis not present

## 2021-01-12 DIAGNOSIS — I959 Hypotension, unspecified: Secondary | ICD-10-CM | POA: Diagnosis not present

## 2021-01-13 ENCOUNTER — Other Ambulatory Visit (HOSPITAL_COMMUNITY): Payer: Self-pay | Admitting: *Deleted

## 2021-01-13 ENCOUNTER — Telehealth: Payer: Self-pay | Admitting: Interventional Cardiology

## 2021-01-13 DIAGNOSIS — M25562 Pain in left knee: Secondary | ICD-10-CM | POA: Diagnosis not present

## 2021-01-13 MED ORDER — MIDODRINE HCL 5 MG PO TABS: 5.0000 mg | ORAL_TABLET | Freq: Three times a day (TID) | ORAL | 2 refills | Status: DC

## 2021-01-13 MED ORDER — APIXABAN 5 MG PO TABS: 5.0000 mg | ORAL_TABLET | Freq: Two times a day (BID) | ORAL | 3 refills | Status: DC

## 2021-01-13 NOTE — Telephone Encounter (Signed)
This is a A-Fib clinic pt 

## 2021-01-13 NOTE — Telephone Encounter (Signed)
*  STAT* If patient is at the pharmacy, call can be transferred to refill team.   1. Which medications need to be refilled? (please list name of each medication and dose if known) midodrine (PROAMATINE) 5 MG tablet  2. Which pharmacy/location (including street and city if local pharmacy) is medication to be sent to? [place], [place] - [address]  3. Do they need a 30 day or 90 day supply? 90 day   Patient is out of medication

## 2021-01-13 NOTE — Telephone Encounter (Signed)
Pt's medication was sent to pt's pharmacy as requested. Confirmation received.  °

## 2021-01-18 DIAGNOSIS — M25562 Pain in left knee: Secondary | ICD-10-CM | POA: Diagnosis not present

## 2021-01-19 ENCOUNTER — Ambulatory Visit: Payer: Medicare HMO

## 2021-01-20 ENCOUNTER — Ambulatory Visit (INDEPENDENT_AMBULATORY_CARE_PROVIDER_SITE_OTHER): Payer: Medicare HMO

## 2021-01-20 ENCOUNTER — Other Ambulatory Visit: Payer: Self-pay

## 2021-01-20 DIAGNOSIS — M25562 Pain in left knee: Secondary | ICD-10-CM | POA: Diagnosis not present

## 2021-01-20 DIAGNOSIS — Z23 Encounter for immunization: Secondary | ICD-10-CM

## 2021-01-23 DIAGNOSIS — M25562 Pain in left knee: Secondary | ICD-10-CM | POA: Diagnosis not present

## 2021-01-31 DIAGNOSIS — M25562 Pain in left knee: Secondary | ICD-10-CM | POA: Diagnosis not present

## 2021-02-03 DIAGNOSIS — M25562 Pain in left knee: Secondary | ICD-10-CM | POA: Diagnosis not present

## 2021-02-07 DIAGNOSIS — M25562 Pain in left knee: Secondary | ICD-10-CM | POA: Diagnosis not present

## 2021-02-10 DIAGNOSIS — M25562 Pain in left knee: Secondary | ICD-10-CM | POA: Diagnosis not present

## 2021-03-16 ENCOUNTER — Encounter (HOSPITAL_COMMUNITY): Payer: Self-pay | Admitting: Physician Assistant

## 2021-03-16 ENCOUNTER — Ambulatory Visit (HOSPITAL_COMMUNITY)
Admission: RE | Admit: 2021-03-16 | Discharge: 2021-03-16 | Disposition: A | Discharge status: Home / Self Care | Payer: Medicare HMO | Source: Ambulatory Visit | Attending: Physician Assistant | Admitting: Physician Assistant

## 2021-03-16 ENCOUNTER — Other Ambulatory Visit: Payer: Self-pay

## 2021-03-16 VITALS — BP 130/76 | HR 59 | Ht 65.0 in | Wt 153.2 lb

## 2021-03-16 DIAGNOSIS — F17290 Nicotine dependence, other tobacco product, uncomplicated: Secondary | ICD-10-CM | POA: Insufficient documentation

## 2021-03-16 DIAGNOSIS — Z6825 Body mass index (BMI) 25.0-25.9, adult: Secondary | ICD-10-CM | POA: Diagnosis not present

## 2021-03-16 DIAGNOSIS — Z951 Presence of aortocoronary bypass graft: Secondary | ICD-10-CM | POA: Diagnosis not present

## 2021-03-16 DIAGNOSIS — I2510 Atherosclerotic heart disease of native coronary artery without angina pectoris: Secondary | ICD-10-CM | POA: Diagnosis not present

## 2021-03-16 DIAGNOSIS — E785 Hyperlipidemia, unspecified: Secondary | ICD-10-CM | POA: Insufficient documentation

## 2021-03-16 DIAGNOSIS — I4892 Unspecified atrial flutter: Secondary | ICD-10-CM | POA: Insufficient documentation

## 2021-03-16 DIAGNOSIS — I480 Paroxysmal atrial fibrillation: Secondary | ICD-10-CM | POA: Diagnosis not present

## 2021-03-16 DIAGNOSIS — D6869 Other thrombophilia: Secondary | ICD-10-CM | POA: Insufficient documentation

## 2021-03-16 DIAGNOSIS — R634 Abnormal weight loss: Secondary | ICD-10-CM | POA: Diagnosis not present

## 2021-03-16 DIAGNOSIS — R69 Illness, unspecified: Secondary | ICD-10-CM | POA: Diagnosis not present

## 2021-03-16 LAB — COMPREHENSIVE METABOLIC PANEL
ALT: 18 U/L (ref 0–44)
AST: 22 U/L (ref 15–41)
Albumin: 3.7 g/dL (ref 3.5–5.0)
Alkaline Phosphatase: 65 U/L (ref 38–126)
Anion gap: 10 (ref 5–15)
BUN: 27 mg/dL — ABNORMAL HIGH (ref 8–23)
CO2: 27 mmol/L (ref 22–32)
Calcium: 9.5 mg/dL (ref 8.9–10.3)
Chloride: 103 mmol/L (ref 98–111)
Creatinine, Ser: 1.15 mg/dL (ref 0.61–1.24)
GFR, Estimated: >60 mL/min (ref >60)
Glucose, Bld: 136 mg/dL — ABNORMAL HIGH (ref 70–99)
Potassium: 4.6 mmol/L (ref 3.5–5.1)
Sodium: 140 mmol/L (ref 135–145)
Total Bilirubin: 0.7 mg/dL (ref 0.3–1.2)
Total Protein: 7.1 g/dL (ref 6.5–8.1)

## 2021-03-16 LAB — TSH: TSH: 0.742 u[IU]/mL (ref 0.350–4.500)

## 2021-03-16 NOTE — Progress Notes (Signed)
Primary Care Physician: Ma Hillock, DO Primary Cardiologist: Dr Irish Lack  Primary Electrophysiologist: Dr Rayann Heman (remotely) Referring Physician: Dr Hilda Blades is a 76 y.o. male with a history of atrial flutter s/p ablation, atrial fibrillation CAD s/p CABG 2001, HLD, renal cancer s/p L nephrectomy who presents for follow up in the [facility].  The patient was initially diagnosed with atrial fibrillation 12/02/20 after presenting for knee replacement surgery. He developed afib postop with rapid rates. He did report symptoms of dizziness when in afib with RVR. He was started on diltiazem which did convert him to SR but he had issues with hypotension. His afib recurred and he was loaded on amiodarone. He was also started on Eliquis for a CHADS2VASC score of 3. In hindsight, patient has had episodes of periodic dizziness before his knee surgery.   On follow up today, patient reports that he has done well since his last visit. He denies any heart racing or palpitations. No dizziness. He continues to work on strengthening his knee. He has noted weight loss and decreased appetite over the past year.   Today, he denies symptoms of palpitations, chest pain, shortness of breath, orthopnea, PND, lower extremity edema, dizziness, presyncope, syncope, snoring, daytime somnolence, bleeding, or neurologic sequela. The patient is tolerating medications without difficulties and is otherwise without complaint today.    Atrial Fibrillation Risk Factors:  he does not have symptoms or diagnosis of sleep apnea. he does not have a history of rheumatic fever.   he has a BMI of Body mass index is 25.49 kg/m.Marland Kitchen Filed Weights   03/16/21 1336  Weight: 69.5 kg     Family History  Problem Relation Age of Onset   Heart attack Mother    Alzheimer's disease Mother    Cancer Father        throat cancer, chewing tobacco   Hypertension Neg Hx      Atrial Fibrillation  Management history:  Previous antiarrhythmic drugs: amiodarone  Previous cardioversions: none Previous ablations: 2011 flutter ablation  CHADS2VASC score: 3 Anticoagulation history: Eliquis   Past Medical History:  Diagnosis Date   Age-related nuclear cataract, bilateral 06/29/2020   Allergic rhinitis    BPH (benign prostatic hyperplasia)    Chicken pox    Coronary artery disease    a. MI 2003 with CABG.   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    Osteoarthritis of knee 05/13/2017   Renal cell carcinoma ([place]) 02/04/2019   Partial nephrectomyKIDNEY, LEFT, PARTIAL NEPHRECTOMY:    Right ureteral stone    S/P ablation of atrial flutter    followed by dr Rayann Heman (EP cardiolgoist)  ablation 04-17-2004  and by dr Rayann Heman 02-10- 2011 was successful   S/P CABG x 5 08-05-2001   in [place] , [place]   LIMA to LAD,  seqVG to Diagonal,  SeqVG to OM,  seqVG to PDA and PLA   Seasonal allergies    Sinus bradycardia    Wears glasses    Past Surgical History:  Procedure Laterality Date   CARDIAC CATHETERIZATION  08-04-2001  in [place], [place]   severe 3 vessel  cad   CARDIAC ELECTROPHYSIOLOGY MAPPING AND ABLATION  x2  04-17-2004 [place], [place]);  04-14-2009 by dr allred   per dr allred report-- successful ablation clockwise isthmus-dependent right atrial flutter along the usual cavotricupid isthmus;  complete bidirectionl isthmus block achieved   CORONARY ARTERY BYPASS GRAFT  08-06-2003  in [place], [place]   x5-- LIMA - LAD,  seqVG to Diagonal, OM, PDA, and PLA   CYSTOSCOPY WITH RETROGRADE PYELOGRAM, URETEROSCOPY AND STENT PLACEMENT Right 08/10/2016   Procedure: CYSTOSCOPY WITH RETROGRADE PYELOGRAM, URETEROSCOPY AND STENT PLACEMENT;  Surgeon: Alexis Frock, MD;  Location[facility];  Service: Urology;  Laterality: Right;   FIXATION RIGHT WRIST Right 2011   stability for arthritis   INGUINAL HERNIA REPAIR Right 08-08-2015   [facility]- [facility] care at [place] Left 02/04/2019   Procedure: XI ROBOTIC ASSITED LEFT PARTIAL NEPHRECTOMY; LEFT PARTIAL ADRENALECTOMY;  Surgeon: Alexis Frock, MD;  Location: [facility];  Service: Urology;  Laterality: Left;  3 HRS   ROTATOR CUFF REPAIR Right 2004;  2007   TOTAL KNEE ARTHROPLASTY Left 11/30/2020   Procedure: TOTAL KNEE ARTHROPLASTY;  Surgeon: Susa Day, MD;  Location: [facility];  Service: Orthopedics;  Laterality: Left;   WISDOM TOOTH EXTRACTION      Current Outpatient Medications  Medication Sig Dispense Refill   acetaminophen (TYLENOL) 500 MG tablet TAKE TWO TABLETS BY MOUTH THREE TIMES A DAY AS NEEDED     acidophilus (RISAQUAD) CAPS capsule Take 1 capsule by mouth daily. 30 capsule 0   amiodarone (PACERONE) 200 MG tablet Take 1 tablet (200 mg total) by mouth daily. 30 tablet 3   apixaban (ELIQUIS) 5 MG TABS tablet Take 1 tablet (5 mg total) by mouth 2 (two) times daily. 60 tablet 3   docusate sodium (COLACE) 100 MG capsule Take 1 capsule (100 mg total) by mouth 2 (two) times daily as needed for mild constipation. 30 capsule 1   gabapentin (NEURONTIN) 100 MG capsule Take 100 mg by mouth at bedtime.     HYDROcodone-acetaminophen (NORCO/VICODIN) 5-325 MG tablet Take 1 tablet by mouth every 4 (four) hours as needed for moderate pain or severe pain. 30 tablet 0   midodrine (PROAMATINE) 5 MG tablet Take 1 tablet (5 mg total) by mouth 3 (three) times daily with meals. 90 tablet 2   Multiple Vitamin (MULTIVITAMIN ADULT PO) Take 1 tablet by mouth daily.     ondansetron (ZOFRAN) 4 MG tablet Take 1 tablet (4 mg total) by mouth every 6 (six) hours as needed for nausea. 20 tablet 0   polyethylene glycol (MIRALAX / GLYCOLAX) 17 g packet Take 17 g  by mouth daily. 14 each 0   No current facility-administered medications for this encounter.    No Known Allergies  Social History   Socioeconomic History   Marital status: Married    Spouse name: Not on file   Number of children: Not on file   Years of education: Not on file   Highest education level: Not on file  Occupational History   Occupation: retired  Tobacco Use   Smoking status: Never   Smokeless tobacco: Current    Types: Snuff   Tobacco comments:    03/16/2021 ~ dip tobacco for 30 yrs  Vaping Use   Vaping Use: Never used  Substance and Sexual Activity   Alcohol use: Not Currently    Comment: occ.    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; Geneticist, molecular Situation: lives with wife      Spiritual Beliefs: Christian      Lifestyle: tries to make it to the gym 5 days per week; healthy diet     -smoke alarm in the home:Yes     - wears seatbelt: Yes     - Feels safe in their relationships: Yes         Social Determinants of Radio broadcast assistant Strain: Low Risk    Difficulty of Paying Living Expenses: Not hard at all  Food Insecurity: No Food Insecurity   Worried About Charity fundraiser in the Last Year: Never true   [place] in the Last Year: Never true  Transportation Needs: No Transportation Needs   Lack of Transportation (Medical): No   Lack of Transportation (Non-Medical): No  Physical Activity: Sufficiently Active   Days of Exercise per Week: 4 days   Minutes of Exercise per Session: 60 min  Stress: No Stress Concern Present   Feeling of Stress : Not at all  Social Connections: Moderately Isolated   Frequency of Communication with Friends and Family: More than three times a week   Frequency of Social Gatherings with Friends and Family: More than three times a week   Attends  Religious Services: Never   Marine scientist or Organizations: No   Attends Music therapist: Never   Marital Status: Married  Human resources officer Violence: Not At Risk   Fear of Current or Ex-Partner: No   Emotionally Abused: No   Physically Abused: No   Sexually Abused: No     ROS- All systems are reviewed and negative except as per the HPI above.  Physical Exam: Vitals:   03/16/21 1336  BP: 130/76  Pulse: (!) 59  Weight: 69.5 kg  Height: 5\' 5"  (1.651 m)    GEN- The patient is a well appearing elderly male, alert and oriented x 3 today.   HEENT-head normocephalic, atraumatic, sclera clear, conjunctiva pink, hearing intact, trachea midline. Lungs- Clear to ausculation bilaterally, normal work of breathing Heart- Regular rate and rhythm, no murmurs, rubs or gallops  GI- soft, NT, ND, + BS Extremities- no clubbing, cyanosis, or edema MS- no significant deformity or atrophy Skin- no rash or lesion Psych- euthymic mood, full affect Neuro- strength and sensation are intact   Wt Readings from Last 3 Encounters:  03/16/21 69.5 kg  12/13/20 77 kg  11/30/20 79.5 kg    EKG today demonstrates  SB Vent. rate 59 BPM PR interval 168 ms QRS duration 118 ms QT/QTcB 456/451 ms  Echo 12/02/20 demonstrated   1. Left ventricular ejection fraction, by estimation, is 70 to 75%. The  left ventricle has hyperdynamic function. The left ventricle has no  regional wall motion abnormalities. Left ventricular diastolic function  could not be evaluated.   2. Right ventricular systolic function is normal. The right ventricular  size is normal.   3. The mitral valve is grossly normal. No evidence of mitral valve  regurgitation.   4. The aortic valve is tricuspid. Aortic valve regurgitation is not  visualized. No aortic stenosis is present.   5. The inferior vena cava is normal in size with <50% respiratory  variability, suggesting right atrial pressure of 8 mmHg.  Comparison(s): Changes from prior study are noted. 01/01/2020: LVEF 55-60%, normal atrial size.   Epic records are reviewed at length today  CHA2DS2-VASc Score = 3  The patient's score is based upon: CHF History: 0 HTN History: 0 Diabetes History: 0 Stroke History: 0 Vascular Disease History: 1 Age Score: 2 Gender Score: 0        ASSESSMENT AND PLAN: 1. Paroxysmal Atrial Fibrillation/atrial flutter The patient's CHA2DS2-VASc score is 3, indicating a 3.2% annual risk of stroke.   S/p flutter ablation 2011 Patient appears to be maintaining SR. Continue amiodarone 200 mg daily Check cmet/TSH today. Continue Eliquis 5 mg BID Will not start rate control given h/o bradycardia and hypotension.   2. Secondary Hypercoagulable State (ICD10:  D68.69) The patient is at significant risk for stroke/thromboembolism based upon his CHA2DS2-VASc Score of 3.  Continue Apixaban (Eliquis).   3. CAD S/p CABG 2001 No anginal symptoms.  4. Weight loss He has lost ~ 16 lbs since his last visit. However, he weight was 159 lbs 10/2020, 153 today. Check TSH today. If weight loss persists, encouraged him to seek evaluation with PCP.    Follow up with Dr Irish Lack per recall.    [facility] [address] [place], [place] [address] [phone] 03/16/2021 1:44 PM

## 2021-04-25 ENCOUNTER — Other Ambulatory Visit (HOSPITAL_COMMUNITY): Payer: Self-pay | Admitting: Physician Assistant

## 2021-05-12 ENCOUNTER — Other Ambulatory Visit: Payer: Self-pay | Admitting: Interventional Cardiology

## 2021-05-15 DIAGNOSIS — Z96652 Presence of left artificial knee joint: Secondary | ICD-10-CM | POA: Diagnosis not present

## 2021-05-24 ENCOUNTER — Other Ambulatory Visit (HOSPITAL_COMMUNITY): Payer: Self-pay | Admitting: Physician Assistant

## 2021-05-25 NOTE — Telephone Encounter (Signed)
Prescription refill request for Eliquis received. ?Indication:Afib ?Last office visit:8/22 ?Scr:1.1 ?Age: 76 ?Weight:69.5 kg ? ?Prescription refilled ? ?

## 2021-06-07 ENCOUNTER — Ambulatory Visit: Payer: Medicare HMO

## 2021-06-07 ENCOUNTER — Ambulatory Visit (INDEPENDENT_AMBULATORY_CARE_PROVIDER_SITE_OTHER): Payer: Medicare HMO

## 2021-06-07 VITALS — BP 120/64 | HR 64 | Temp 97.9°F | Wt 147.8 lb

## 2021-06-07 DIAGNOSIS — Z0000 Encounter for general adult medical examination without abnormal findings: Secondary | ICD-10-CM | POA: Diagnosis not present

## 2021-06-07 NOTE — Patient Instructions (Signed)
Donald [name] , ?Thank you for taking time to come for your Medicare Wellness Visit. I appreciate your ongoing commitment to your health goals. Please review the following plan we discussed and let me know if I can assist you in the future.  ? ?Screening recommendations/referrals: ?Colonoscopy: No longer required  ?Recommended yearly ophthalmology/optometry visit for glaucoma screening and checkup ?Recommended yearly dental visit for hygiene and checkup ? ?Vaccinations: ?Influenza vaccine: Done 01/20/21 repeat every year ?Pneumococcal vaccine: Up to date ?Tdap vaccine: Done 06/11/16 repeat every 10 years  ?Shingles vaccine: Completed 9/9, 02/11/20    ?Covid-19: Completed 2/25, 3/17, 01/11/20 ? ?Advanced directives: Copies in chart  ? ?Conditions/risks identified: none at this time  ? ?Next appointment: Follow up in one year for your annual wellness visit.  ? ?Preventive Care 52 Years and Older, Male ?Preventive care refers to lifestyle choices and visits with your health care provider that can promote health and wellness. ?What does preventive care include? ?A yearly physical exam. This is also called an annual well check. ?Dental exams once or twice a year. ?Routine eye exams. Ask your health care provider how often you should have your eyes checked. ?Personal lifestyle choices, including: ?Daily care of your teeth and gums. ?Regular physical activity. ?Eating a healthy diet. ?Avoiding tobacco and drug use. ?Limiting alcohol use. ?Practicing safe sex. ?Taking low doses of aspirin every day. ?Taking vitamin and mineral supplements as recommended by your health care provider. ?What happens during an annual well check? ?The services and screenings done by your health care provider during your annual well check will depend on your age, overall health, lifestyle risk factors, and family history of disease. ?Counseling  ?Your health care provider may ask you questions about your: ?Alcohol use. ?Tobacco use. ?Drug use. ?Emotional  well-being. ?Home and relationship well-being. ?Sexual activity. ?Eating habits. ?History of falls. ?Memory and ability to understand (cognition). ?Work and work Statistician. ?Screening  ?You may have the following tests or measurements: ?Height, weight, and BMI. ?Blood pressure. ?Lipid and cholesterol levels. These may be checked every 5 years, or more frequently if you are over 46 years old. ?Skin check. ?Lung cancer screening. You may have this screening every year starting at age 65 if you have a 30-pack-year history of smoking and currently smoke or have quit within the past 15 years. ?Fecal occult blood test (FOBT) of the stool. You may have this test every year starting at age 35. ?Flexible sigmoidoscopy or colonoscopy. You may have a sigmoidoscopy every 5 years or a colonoscopy every 10 years starting at age 73. ?Prostate cancer screening. Recommendations will vary depending on your family history and other risks. ?Hepatitis C blood test. ?Hepatitis B blood test. ?Sexually transmitted disease (STD) testing. ?Diabetes screening. This is done by checking your blood sugar (glucose) after you have not eaten for a while (fasting). You may have this done every 1-3 years. ?Abdominal aortic aneurysm (AAA) screening. You may need this if you are a current or former smoker. ?Osteoporosis. You may be screened starting at age 16 if you are at high risk. ?Talk with your health care provider about your test results, treatment options, and if necessary, the need for more tests. ?Vaccines  ?Your health care provider may recommend certain vaccines, such as: ?Influenza vaccine. This is recommended every year. ?Tetanus, diphtheria, and acellular pertussis (Tdap, Td) vaccine. You may need a Td booster every 10 years. ?Zoster vaccine. You may need this after age 59. ?Pneumococcal 13-valent conjugate (PCV13) vaccine. One dose  is recommended after age 47. ?Pneumococcal polysaccharide (PPSV23) vaccine. One dose is recommended after  age 37. ?Talk to your health care provider about which screenings and vaccines you need and how often you need them. ?This information is not intended to replace advice given to you by your health care provider. Make sure you discuss any questions you have with your health care provider. ?Document Released: 03/18/2015 Document Revised: 11/09/2015 Document Reviewed: 12/21/2014 ?Elsevier Interactive Patient Education ? 2017 [employer]. ? ?Fall Prevention in the Home ?Falls can cause injuries. They can happen to people of all ages. There are many things you can do to make your home safe and to help prevent falls. ?What can I do on the outside of my home? ?Regularly fix the edges of walkways and driveways and fix any cracks. ?Remove anything that might make you trip as you walk through a door, such as a raised step or threshold. ?Trim any bushes or trees on the path to your home. ?Use bright outdoor lighting. ?Clear any walking paths of anything that might make someone trip, such as rocks or tools. ?Regularly check to see if handrails are loose or broken. Make sure that both sides of any steps have handrails. ?Any raised decks and porches should have guardrails on the edges. ?Have any leaves, snow, or ice cleared regularly. ?Use sand or salt on walking paths during winter. ?Clean up any spills in your garage right away. This includes oil or grease spills. ?What can I do in the bathroom? ?Use night lights. ?Install grab bars by the toilet and in the tub and shower. Do not use towel bars as grab bars. ?Use non-skid mats or decals in the tub or shower. ?If you need to sit down in the shower, use a plastic, non-slip stool. ?Keep the floor dry. Clean up any water that spills on the floor as soon as it happens. ?Remove soap buildup in the tub or shower regularly. ?Attach bath mats securely with double-sided non-slip rug tape. ?Do not have throw rugs and other things on the floor that can make you trip. ?What can I do in the  bedroom? ?Use night lights. ?Make sure that you have a light by your bed that is easy to reach. ?Do not use any sheets or blankets that are too big for your bed. They should not hang down onto the floor. ?Have a firm chair that has side arms. You can use this for support while you get dressed. ?Do not have throw rugs and other things on the floor that can make you trip. ?What can I do in the kitchen? ?Clean up any spills right away. ?Avoid walking on wet floors. ?Keep items that you use a lot in easy-to-reach places. ?If you need to reach something above you, use a strong step stool that has a grab bar. ?Keep electrical cords out of the way. ?Do not use floor polish or wax that makes floors slippery. If you must use wax, use non-skid floor wax. ?Do not have throw rugs and other things on the floor that can make you trip. ?What can I do with my stairs? ?Do not leave any items on the stairs. ?Make sure that there are handrails on both sides of the stairs and use them. Fix handrails that are broken or loose. Make sure that handrails are as long as the stairways. ?Check any carpeting to make sure that it is firmly attached to the stairs. Fix any carpet that is loose or worn. ?Avoid  having throw rugs at the top or bottom of the stairs. If you do have throw rugs, attach them to the floor with carpet tape. ?Make sure that you have a light switch at the top of the stairs and the bottom of the stairs. If you do not have them, ask someone to add them for you. ?What else can I do to help prevent falls? ?Wear shoes that: ?Do not have high heels. ?Have rubber bottoms. ?Are comfortable and fit you well. ?Are closed at the toe. Do not wear sandals. ?If you use a stepladder: ?Make sure that it is fully opened. Do not climb a closed stepladder. ?Make sure that both sides of the stepladder are locked into place. ?Ask someone to hold it for you, if possible. ?Clearly mark and make sure that you can see: ?Any grab bars or  handrails. ?First and last steps. ?Where the edge of each step is. ?Use tools that help you move around (mobility aids) if they are needed. These include: ?Canes. ?Walkers. ?Scooters. ?Crutches. ?Turn on the li

## 2021-06-07 NOTE — Progress Notes (Signed)
? ?Subjective:  ? Donald [name] is a 76 y.o. male who presents for Medicare Annual/Subsequent preventive examination. ? ?Review of Systems    ? ?Cardiac Risk Factors include: advanced age (>53mn, >>72women);dyslipidemia;male gender ? ?   ?Objective:  ?  ?Today's Vitals  ? 06/07/21 1459  ?BP: 120/64  ?Pulse: 64  ?Temp: 97.9 ?F (36.6 ?C)  ?SpO2: 96%  ?Weight: 147 lb 12.8 oz (67 kg)  ? ?Body mass index is 24.6 kg/m?. ? ? ?  06/07/2021  ?  3:12 PM 12/03/2020  ?  5:00 AM 11/30/2020  ?  5:55 AM 11/22/2020  ? 11:02 AM 06/01/2020  ?  2:18 PM 02/04/2019  ?  6:59 AM 02/02/2019  ? 10:32 AM  ?Advanced Directives  ?Does Patient Have a Medical Advance Directive? Yes Yes Yes Yes Yes Yes Yes  ?Type of [place]aramed[place]of [place]ear[place] will [place]Living will [place] VistaLiving will [place]Living will [place]Living will [place]San Juan [place]aut[place]Living will  ?Does patient want to make changes to medical advance directive?  No - Patient declined No - Patient declined   No - Patient declined No - Patient declined  ?Copy of [place] Chart? Yes - validated most recent copy scanned in chart (See row information) No - copy requested No - copy requested Yes - validated most recent copy scanned in chart (See row information) Yes - validated most recent copy scanned in chart (See row information) Yes - validated most recent copy scanned in chart (See row information) No - copy requested  ? ? ?Current Medications (verified) ?Outpatient Encounter Medications as of 06/07/2021  ?Medication Sig  ? acetaminophen (TYLENOL) 500 MG tablet TAKE TWO TABLETS BY MOUTH THREE TIMES A DAY AS NEEDED  ? acidophilus (RISAQUAD) CAPS capsule Take 1 capsule by mouth daily.  ? amiodarone (PACERONE) 200 MG tablet Take 1 tablet by mouth once daily  ? apixaban (ELIQUIS) 5 MG TABS tablet Take 1 tablet by mouth twice  daily  ? atorvastatin (LIPITOR) 40 MG tablet Take 40 mg by mouth daily.  ? Multiple Vitamin (MULTIVITAMIN ADULT PO) Take 1 tablet by mouth daily.  ? Red Yeast Rice Extract (RED YEAST RICE PO) Take by mouth.  ? [DISCONTINUED] docusate sodium (COLACE) 100 MG capsule Take 1 capsule (100 mg total) by mouth 2 (two) times daily as needed for mild constipation.  ? [DISCONTINUED] gabapentin (NEURONTIN) 100 MG capsule Take 100 mg by mouth at bedtime.  ? [DISCONTINUED] HYDROcodone-acetaminophen (NORCO/VICODIN) 5-325 MG tablet Take 1 tablet by mouth every 4 (four) hours as needed for moderate pain or severe pain.  ? [DISCONTINUED] midodrine (PROAMATINE) 5 MG tablet TAKE 1 TABLET BY MOUTH THREE TIMES DAILY WITH MEALS  ? [DISCONTINUED] ondansetron (ZOFRAN) 4 MG tablet Take 1 tablet (4 mg total) by mouth every 6 (six) hours as needed for nausea.  ? [DISCONTINUED] polyethylene glycol (MIRALAX / GLYCOLAX) 17 g packet Take 17 g by mouth daily.  ? ?No facility-administered encounter medications on file as of 06/07/2021.  ? ? ?Allergies (verified) ?Patient has no known allergies.  ? ?History: ?Past Medical History:  ?Diagnosis Date  ? Age-related nuclear cataract, bilateral 06/29/2020  ? Allergic rhinitis   ? BPH (benign prostatic hyperplasia)   ? Chicken pox   ? Coronary artery disease   ? a. MI 2003 with CABG.  ? Hematuria   ? History of basal cell carcinoma (BCC)  excision   ? left upper back, right chest area  ? History of colon polyps   ? History of kidney stones   ? Hydronephrosis, right   ? Incomplete right bundle branch block (RBBB)   ? Myocardial infarction [facility])   ? 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 Osteoarthritis of knee 05/13/2017  ? Renal cell carcinoma ([place]) 02/04/2019  ? Partial nephrectomyKIDNEY, LEFT, PARTIAL NEPHRECTOMY:   ? Right ureteral stone   ? S/P ablation of atrial flutter   ? followed by dr Rayann Heman (EP cardiolgoist)  ablation 04-17-2004  and by dr allred 02-10- 2011 was successful  ? S/P  CABG x 5 08-05-2001   in [place] , [place]  ? LIMA to LAD,  seqVG to Diagonal,  SeqVG to OM,  seqVG to PDA and PLA  ? Seasonal allergies   ? Sinus bradycardia   ? Wears glasses   ? ?Past Surgical History:  ?Procedure Laterality Date  ? CARDIAC CATHETERIZATION  08-04-2001  in [place], [place]  ? severe 3 vessel cad  ? CARDIAC ELECTROPHYSIOLOGY MAPPING AND ABLATION  x2  04-17-2004 [place], [place]);  04-14-2009 by dr allred  ? per dr allred report-- successful ablation clockwise isthmus-dependent right atrial flutter along the usual cavotricupid isthmus;  complete bidirectionl isthmus block achieved  ? CORONARY ARTERY BYPASS GRAFT  08-06-2003  in [place], [place]  ? x5-- LIMA - LAD,  seqVG to Diagonal, OM, PDA, and PLA  ? CYSTOSCOPY WITH RETROGRADE PYELOGRAM, URETEROSCOPY AND STENT PLACEMENT Right 08/10/2016  ? Procedure: CYSTOSCOPY WITH RETROGRADE PYELOGRAM, URETEROSCOPY AND STENT PLACEMENT;  Surgeon: Alexis Frock, MD;  Location: [facility];  Service: Urology;  Laterality: Right;  ? FIXATION RIGHT WRIST Right 2011  ? stability for arthritis  ? INGUINAL HERNIA REPAIR Right 08-08-2015   [facility]- health care at [place]  ? ROBOTIC ASSITED PARTIAL NEPHRECTOMY Left 02/04/2019  ? Procedure: XI ROBOTIC ASSITED LEFT PARTIAL NEPHRECTOMY; LEFT PARTIAL ADRENALECTOMY;  Surgeon: Alexis Frock, MD;  Location: [facility];  Service: Urology;  Laterality: Left;  3 HRS  ? ROTATOR CUFF REPAIR Right 2004;  2007  ? TOTAL KNEE ARTHROPLASTY Left 11/30/2020  ? Procedure: TOTAL KNEE ARTHROPLASTY;  Surgeon: Susa Day, MD;  Location: [facility];  Service: Orthopedics;  Laterality: Left;  ? WISDOM TOOTH EXTRACTION    ? ?Family History  ?Problem Relation Age of Onset  ? Heart attack Mother   ? Alzheimer's disease Mother   ? Cancer Father   ?     throat cancer, chewing tobacco  ? Hypertension Neg Hx   ? ?Social History  ? ?Socioeconomic History  ? Marital status: Married  ?  Spouse name: Not on file  ? Number of children: Not on file  ? Years of  education: Not on file  ? Highest education level: Not on file  ?Occupational History  ? Occupation: retired  ?Tobacco Use  ? Smoking status: Never  ? Smokeless tobacco: Current  ?  Types: Snuff  ? Tobacco comments:  ?  03/16/2021 ~ dip tobacco for 30 yrs  ?Vaping Use  ? Vaping Use: Never used  ?Substance and Sexual Activity  ? Alcohol use: Not Currently  ?  Comment: occ.   ? Drug use: No  ? Sexual activity: Not Currently  ?  Partners: Female  ?Other Topics Concern  ? Not on file  ?Social History Narrative  ? 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   ?  Home Situation: lives with wife  ?   ? Spiritual Beliefs: Christian  ?   ? Lifestyle: tries to make it to the gym 5 days per week; healthy diet  ?   -smoke alarm in the home:Yes  ?   - wears seatbelt: Yes  ?   - Feels safe in their relationships: Yes  ?   ?   ? ?Social Determinants of Health  ? ?Financial Resource Strain: Low Risk   ? Difficulty of Paying Living Expenses: Not hard at all  ?Food Insecurity: No Food Insecurity  ? Worried About Charity fundraiser in the Last Year: Never true  ? Ran Out of Food in the Last Year: Never true  ?Transportation Needs: No Transportation Needs  ? Lack of Transportation (Medical): No  ? Lack of Transportation (Non-Medical): No  ?Physical Activity: Insufficiently Active  ? Days of Exercise per Week: 2 days  ? Minutes of Exercise per Session: 40 min  ?Stress: No Stress Concern Present  ? Feeling of Stress : Not at all  ?Social Connections: Moderately Isolated  ? Frequency of Communication with Friends and Family: Three times a week  ? Frequency of Social Gatherings with Friends and Family: More than three times a week  ? Attends Religious Services: Never  ? Active Member of Clubs or Organizations: No  ? Attends Archivist Meetings: Never  ? Marital Status: Married  ? ? ?Tobacco Counseling ?Ready to quit: Not Answered ?Counseling given: Not Answered ?Tobacco comments: 03/16/2021 ~ dip tobacco for  30 yrs ? ? ?Clinical Intake: ? ?Pre-visit preparation completed: Yes ? ?Pain : No/denies pain ? ?  ? ?BMI - recorded: 24.6 ?Nutritional Status: BMI of 19-24  Normal ?Nutritional Risks: None ?Diabetes: No ? ?How

## 2021-06-15 DIAGNOSIS — H40003 Preglaucoma, unspecified, bilateral: Secondary | ICD-10-CM | POA: Insufficient documentation

## 2021-06-15 DIAGNOSIS — H40012 Open angle with borderline findings, low risk, left eye: Secondary | ICD-10-CM | POA: Insufficient documentation

## 2021-06-15 DIAGNOSIS — M722 Plantar fascial fibromatosis: Secondary | ICD-10-CM | POA: Insufficient documentation

## 2021-06-16 ENCOUNTER — Encounter: Payer: Self-pay | Admitting: Family Medicine

## 2021-06-16 ENCOUNTER — Ambulatory Visit (INDEPENDENT_AMBULATORY_CARE_PROVIDER_SITE_OTHER): Payer: Medicare HMO | Admitting: Family Medicine

## 2021-06-16 VITALS — BP 122/66 | HR 63 | Temp 97.4°F | Ht 65.0 in | Wt 151.0 lb

## 2021-06-16 DIAGNOSIS — R634 Abnormal weight loss: Secondary | ICD-10-CM | POA: Diagnosis not present

## 2021-06-16 DIAGNOSIS — R5383 Other fatigue: Secondary | ICD-10-CM | POA: Diagnosis not present

## 2021-06-16 DIAGNOSIS — L820 Inflamed seborrheic keratosis: Secondary | ICD-10-CM

## 2021-06-16 DIAGNOSIS — C642 Malignant neoplasm of left kidney, except renal pelvis: Secondary | ICD-10-CM | POA: Diagnosis not present

## 2021-06-16 DIAGNOSIS — D6869 Other thrombophilia: Secondary | ICD-10-CM | POA: Diagnosis not present

## 2021-06-16 DIAGNOSIS — I4892 Unspecified atrial flutter: Secondary | ICD-10-CM | POA: Diagnosis not present

## 2021-06-16 LAB — CBC WITH DIFFERENTIAL/PLATELET
Basophils Absolute: 0.1 10*3/uL (ref 0.0–0.1)
Basophils Relative: 1.3 % (ref 0.0–3.0)
Eosinophils Absolute: 0.1 10*3/uL (ref 0.0–0.7)
Eosinophils Relative: 1.9 % (ref 0.0–5.0)
HCT: 37.3 % — ABNORMAL LOW (ref 39.0–52.0)
Hemoglobin: 12.2 g/dL — ABNORMAL LOW (ref 13.0–17.0)
Lymphocytes Relative: 22.7 % (ref 12.0–46.0)
Lymphs Abs: 1.3 10*3/uL (ref 0.7–4.0)
MCHC: 32.7 g/dL (ref 30.0–36.0)
MCV: 91.5 fl (ref 78.0–100.0)
Monocytes Absolute: 0.5 10*3/uL (ref 0.1–1.0)
Monocytes Relative: 7.7 % (ref 3.0–12.0)
Neutro Abs: 3.9 10*3/uL (ref 1.4–7.7)
Neutrophils Relative %: 66.4 % (ref 43.0–77.0)
Platelets: 209 10*3/uL (ref 150.0–400.0)
RBC: 4.08 Mil/uL — ABNORMAL LOW (ref 4.22–5.81)
RDW: 15.2 % (ref 11.5–15.5)
WBC: 5.9 10*3/uL (ref 4.0–10.5)

## 2021-06-16 LAB — COMPREHENSIVE METABOLIC PANEL
ALT: 21 U/L (ref 0–53)
AST: 19 U/L (ref 0–37)
Albumin: 3.9 g/dL (ref 3.5–5.2)
Alkaline Phosphatase: 63 U/L (ref 39–117)
BUN: 27 mg/dL — ABNORMAL HIGH (ref 6–23)
CO2: 30 mEq/L (ref 19–32)
Calcium: 9.2 mg/dL (ref 8.4–10.5)
Chloride: 105 mEq/L (ref 96–112)
Creatinine, Ser: 1.26 mg/dL (ref 0.40–1.50)
GFR: 54.85 mL/min — ABNORMAL LOW (ref >60.00)
Glucose, Bld: 93 mg/dL (ref 70–99)
Potassium: 4.9 mEq/L (ref 3.5–5.1)
Sodium: 139 mEq/L (ref 135–145)
Total Bilirubin: 0.6 mg/dL (ref 0.2–1.2)
Total Protein: 6.3 g/dL (ref 6.0–8.3)

## 2021-06-16 LAB — VITAMIN B12: Vitamin B-12: 389 pg/mL (ref 211–911)

## 2021-06-16 LAB — TSH: TSH: 0.99 u[IU]/mL (ref 0.35–5.50)

## 2021-06-16 LAB — VITAMIN D 25 HYDROXY (VIT D DEFICIENCY, FRACTURES): VITD: 23.98 ng/mL — ABNORMAL LOW (ref 30.00–100.00)

## 2021-06-16 NOTE — Progress Notes (Signed)
? ? ? ?This visit occurred during the SARS-CoV-2 public health emergency.  Safety protocols were in place, including screening questions prior to the visit, additional usage of staff PPE, and extensive cleaning of exam room while observing appropriate contact time as indicated for disinfecting solutions.  ? ? ?Donald [name] , 1946/01/29, 76 y.o., male ?MRN: [id number] ?Patient Care Team  ?  Relationship Specialty Notifications Start End  ?Ma Hillock, DO PCP - General Family Medicine  12/11/18   ?Jettie Booze, MD PCP - Cardiology Cardiology Admissions 11/25/18   ?Thompson Grayer, MD PCP - Electrophysiology Cardiology Admissions 11/25/18   ?Ulyses Southward., MD Consulting Physician Urology  09/09/14   ?Jettie Booze, MD Consulting Physician Cardiology  09/09/14   ?Alexis Frock, MD Consulting Physician Urology  01/12/19   ?Jarome Matin, MD Consulting Physician Dermatology  01/12/19   ? ? ?Chief Complaint  ?Patient presents with  ? Fatigue  ?  Pt c/o fatigue, weight loss of 15#, x 6 mos;   ? ?  ?Subjective: Pt presents for an OV with complaints of fatigue and unintentional weight loss of 15 pounds since September.  Patient was hospitalized in September after having surgery on his knee.  He reports he was hospitalized for about 2 weeks and during that time he lost a great deal of muscle mass.  EMR review in September he weighed 175 pounds and he is weighing 151 pounds today.  He reports he feels fine other than some fatigue.  He denies any chest pain or shortness of breath.  His bowel movements are normal without any changes or melena.  He follows with urology closely for his renal cell carcinoma history.  He denies any bone pain or back pain.  He had an echo in September 2022 which was good.  He follows with cardiology.  He reports he completed physical therapy after his knee surgery.  He is going to the gym about 3 times a week and working out both with cardiovascular exercise and light weights.  He states  he has been able to maintain his weight around 150 pounds, but he has not been able to gain weight despite trying to increase his calories. ?He reports there was 1 episode when he got up to walk to his patio that he became dizzy.  He states the room started to spin a little and his vision got blurry.  He leaned against the banister and symptoms resolved with then 30 seconds.  He denies any other associated symptoms with this event such as chest pain, shortness of breath, diaphoresis. ?In September he was started on amiodarone, Eliquis and midodrine for atrial fibrillation became persistent after surgery.  He has a history of A-fib, a flutter, history of MI/CAD, acquired thrombophilia-Eliquis use ?He reports he went and came in today, except for his wife has been encouraging him to be seen and is concerned of his weight loss. ? ? ?  06/07/2021  ?  3:11 PM 01/20/2021  ? 10:11 AM 06/01/2020  ?  2:20 PM 01/12/2019  ?  1:33 PM 09/09/2014  ?  8:24 AM  ?Depression screen PHQ 2/9  ?Decreased Interest 0 0 0 0 0  ?Down, Depressed, Hopeless 0 0 0 0 0  ?PHQ - 2 Score 0 0 0 0 0  ? ? ?No Known Allergies ?Social History  ? ?Social History Narrative  ? 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   ? Home Situation: lives  with wife  ?   ? Spiritual Beliefs: Christian  ?   ? Lifestyle: tries to make it to the gym 5 days per week; healthy diet  ?   -smoke alarm in the home:Yes  ?   - wears seatbelt: Yes  ?   - Feels safe in their relationships: Yes  ?   ?   ? ?Past Medical History:  ?Diagnosis Date  ? Age-related nuclear cataract, bilateral 06/29/2020  ? Allergic rhinitis   ? BPH (benign prostatic hyperplasia)   ? Chicken pox   ? Coronary artery disease   ? a. MI 2003 with CABG.  ? Hematuria   ? History of basal cell carcinoma (BCC) excision   ? left upper back, right chest area  ? History of colon polyps   ? History of kidney stones   ? History of total knee arthroplasty 11/30/2020  ? Hydronephrosis, right   ?  Incomplete right bundle branch block (RBBB)   ? Myocardial infarction [facility])   ? 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 Osteoarthritis of knee 05/13/2017  ? Renal cell carcinoma ([place]) 02/04/2019  ? Partial nephrectomyKIDNEY, LEFT, PARTIAL NEPHRECTOMY:   ? Right ureteral stone   ? S/P ablation of atrial flutter   ? followed by dr Rayann Heman (EP cardiolgoist)  ablation 04-17-2004  and by dr allred 02-10- 2011 was successful  ? S/P CABG x 5 08-05-2001   in [place] , [place]  ? LIMA to LAD,  seqVG to Diagonal,  SeqVG to OM,  seqVG to PDA and PLA  ? Seasonal allergies   ? Sinus bradycardia   ? Wears glasses   ? ?Past Surgical History:  ?Procedure Laterality Date  ? CARDIAC CATHETERIZATION  08-04-2001  in [place], [place]  ? severe 3 vessel cad  ? CARDIAC ELECTROPHYSIOLOGY MAPPING AND ABLATION  x2  04-17-2004 [place], [place]);  04-14-2009 by dr allred  ? per dr allred report-- successful ablation clockwise isthmus-dependent right atrial flutter along the usual cavotricupid isthmus;  complete bidirectionl isthmus block achieved  ? CORONARY ARTERY BYPASS GRAFT  08-06-2003  in [place], [place]  ? x5-- LIMA - LAD,  seqVG to Diagonal, OM, PDA, and PLA  ? CYSTOSCOPY WITH RETROGRADE PYELOGRAM, URETEROSCOPY AND STENT PLACEMENT Right 08/10/2016  ? Procedure: CYSTOSCOPY WITH RETROGRADE PYELOGRAM, URETEROSCOPY AND STENT PLACEMENT;  Surgeon: Alexis Frock, MD;  Location: [facility];  Service: Urology;  Laterality: Right;  ? FIXATION RIGHT WRIST Right 2011  ? stability for arthritis  ? INGUINAL HERNIA REPAIR Right 08-08-2015   [facility]- health care at [place]  ? ROBOTIC ASSITED PARTIAL NEPHRECTOMY Left 02/04/2019  ? Procedure: XI ROBOTIC ASSITED LEFT PARTIAL NEPHRECTOMY; LEFT PARTIAL ADRENALECTOMY;  Surgeon: Alexis Frock, MD;  Location: [facility];  Service: Urology;  Laterality: Left;  3 HRS  ? ROTATOR CUFF REPAIR Right 2004;  2007  ? TOTAL KNEE ARTHROPLASTY Left 11/30/2020  ? Procedure: TOTAL KNEE ARTHROPLASTY;  Surgeon:  Susa Day, MD;  Location: [facility];  Service: Orthopedics;  Laterality: Left;  ? WISDOM TOOTH EXTRACTION    ? ?Family History  ?Problem Relation Age of Onset  ? Heart attack Mother   ? Alzheimer's disease Mother   ? Cancer Father   ?     throat cancer, chewing tobacco  ? Hypertension Neg Hx   ? ?Allergies as of 06/16/2021   ?No Known Allergies ?  ? ?  ?Medication List  ?  ? ?  ? Accurate  as of June 16, 2021  6:14 PM. If you have any questions, ask your nurse or doctor.  ?  ?  ? ?  ? ?STOP taking these medications   ? ?acetaminophen 500 MG tablet ?Commonly known as: TYLENOL ?Stopped by: Howard Pouch, DO ?  ?acidophilus Caps capsule ?Stopped by: Howard Pouch, DO ?  ? ?  ? ?TAKE these medications   ? ?amiodarone 200 MG tablet ?Commonly known as: PACERONE ?Take 1 tablet by mouth once daily ?  ?atorvastatin 40 MG tablet ?Commonly known as: LIPITOR ?Take 40 mg by mouth daily. ?  ?Eliquis 5 MG Tabs tablet ?Generic drug: apixaban ?Take 1 tablet by mouth twice daily ?  ?midodrine 5 MG tablet ?Commonly known as: PROAMATINE ?Take 5 mg by mouth 3 (three) times daily. ?  ?MULTIVITAMIN ADULT PO ?Take 1 tablet by mouth daily. ?  ?RED YEAST RICE PO ?Take by mouth. ?  ? ?  ? ? ?All past medical history, surgical history, allergies, family history, immunizations andmedications were updated in the EMR today and reviewed under the history and medication portions of their EMR.    ? ?ROS ?Negative, with the exception of above mentioned in HPI ? ? ?Objective:  ?BP 122/66   Pulse 63   Temp (!) 97.4 ?F (36.3 ?C) (Oral)   Ht '5\' 5"'$  (1.651 m)   Wt 151 lb (68.5 kg)   SpO2 99%   BMI 25.13 kg/m?  ?Body mass index is 25.13 kg/m?Marland Kitchen ?Physical Exam ?Vitals and nursing note reviewed.  ?Constitutional:   ?   General: He is not in acute distress. ?   Appearance: Normal appearance. He is not ill-appearing, toxic-appearing or diaphoretic.  ?HENT:  ?   Head: Normocephalic and atraumatic.  ?   Mouth/Throat:  ?   Mouth: Mucous membranes are dry.   ?Eyes:  ?   General: No scleral icterus.    ?   Right eye: No discharge.     ?   Left eye: No discharge.  ?   Extraocular Movements: Extraocular movements intact.  ?   Pupils: Pupils are equal, round, and reactive to ligh

## 2021-06-16 NOTE — Patient Instructions (Addendum)
? ? ?If you desire to have skin lesions removed- please make an appt for "PROCEDURE appt: skin lesion removal in 11:20 slot." ? ? ? ? ? ?Fatigue ?If you have fatigue, you feel tired all the time and have a lack of energy or a lack of motivation. Fatigue may make it difficult to start or complete tasks because of exhaustion. ?Occasional or mild fatigue is often a normal response to activity or life. However, long-term (chronic) or extreme fatigue may be a symptom of a medical condition such as: ?Depression. ?Not having enough red blood cells or hemoglobin in the blood (anemia). ?A problem with a small gland located in the lower front part of the neck (thyroid disorder). ?Rheumatologic conditions. These are problems related to the body's defense system (immune system). ?Infections, especially certain viral infections. ?Fatigue can also lead to negative health outcomes over time. ?Follow these instructions at home: ?Medicines ?Take over-the-counter and prescription medicines only as told by your health care provider. ?Take a multivitamin if told by your health care provider. ?Do not use herbal or dietary supplements unless they are approved by your health care provider. ?Eating and drinking ? ?Avoid heavy meals in the evening. ?Eat a well-balanced diet, which includes lean proteins, whole grains, plenty of fruits and vegetables, and low-fat dairy products. ?Avoid eating or drinking too many products with caffeine in them. ?Avoid alcohol. ?Drink enough fluid to keep your urine pale yellow. ?Activity ? ?Exercise regularly, as told by your health care provider. ?Use or practice techniques to help you relax, such as yoga, tai chi, meditation, or massage therapy. ?Lifestyle ?Change situations that cause you stress. Try to keep your work and personal schedules in balance. ?Do not use recreational or illegal drugs. ?General instructions ?Monitor your fatigue for any changes. ?Go to bed and get up at the same time every  day. ?Avoid fatigue by pacing yourself during the day and getting enough sleep at night. ?Maintain a healthy weight. ?Contact a health care provider if: ?Your fatigue does not get better. ?You have a fever. ?You suddenly lose or gain weight. ?You have headaches. ?You have trouble falling asleep or sleeping through the night. ?You feel angry, guilty, anxious, or sad. ?You have swelling in your legs or another part of your body. ?Get help right away if: ?You feel confused, feel like you might faint, or faint. ?Your vision is blurry or you have a severe headache. ?You have severe pain in your abdomen, your back, or the area between your waist and hips (pelvis). ?You have chest pain, shortness of breath, or an irregular or fast heartbeat. ?You are unable to urinate, or you urinate less than normal. ?You have abnormal bleeding from the rectum, nose, lungs, nipples, or, if you are male, the vagina. ?You vomit blood. ?You have thoughts about hurting yourself or others. ?These symptoms may be an emergency. Get help right away. Call 911. ?Do not wait to see if the symptoms will go away. ?Do not drive yourself to the hospital. ?Get help right away if you feel like you may hurt yourself or others, or have thoughts about taking your own life. Go to your nearest emergency room or: ?Call 911. ?Call the [facility] at [phone] or [phone]. This is open 24 hours a day. ?Text the Crisis Text Line at (618)878-8745. ?Summary ?If you have fatigue, you feel tired all the time and have a lack of energy or a lack of motivation. ?Fatigue may make it difficult to start  or complete tasks because of exhaustion. ?Long-term (chronic) or extreme fatigue may be a symptom of a medical condition. ?Exercise regularly, as told by your health care provider. ?Change situations that cause you stress. Try to keep your work and personal schedules in balance. ?This information is not intended to replace advice given to you by your  health care provider. Make sure you discuss any questions you have with your health care provider. ?Document Revised: 12/12/2020 Document Reviewed: 12/12/2020 ?Elsevier Patient Education ? [facility]. ? ?

## 2021-06-19 ENCOUNTER — Telehealth: Payer: Self-pay | Admitting: Family Medicine

## 2021-06-19 NOTE — Telephone Encounter (Signed)
LVM for pt to CB regarding results.  

## 2021-06-19 NOTE — Telephone Encounter (Signed)
Please inform patient ?His kidney function is just mildly decreased, this is most likely secondary to lack of adequate hydration by the way the labs look.  I would encourage him to increase his water consumption as we discussed during his visit. ?  -Also please forward these labs over to his urologist.  Thanks. ?Vitamin D is mildly low at 23.9, I would encourage him to increase his daily vitamin D supplement by 1000 units daily.  Vitamin D deficiency can cause fatigue. ?His iron panel is normal. ?Blood cell counts have greatly improved from last collections and are stable. ?Thyroid is functioning normal. ?His autoimmune panel had a positive ANA but a very low titer and a negative reflex panel.  This is not considered significant for an autoimmune disorder with this criteria. ? ? ?Currently all labs are reassuring.  His BMI is in normal range for a healthy male his age at 28 BMI.  This should be reassuring as long as he does not continue to lose weight and is able to maintain stability at least at 150, which he has. ? ?One other lab is pending and takes a couple days to return.  We will call him with that result once we receive it. ?

## 2021-06-20 LAB — PROTEIN,TOTAL AND ELECTROPHOR W/IFE
Albumin ELP: 3.7 g/dL — ABNORMAL LOW (ref 3.8–4.8)
Alpha 1: 0.3 g/dL (ref 0.2–0.3)
Alpha 2: 0.8 g/dL (ref 0.5–0.9)
Beta 2: 0.4 g/dL (ref 0.2–0.5)
Beta Globulin: 0.4 g/dL (ref 0.4–0.6)
Gamma Globulin: 0.9 g/dL (ref 0.8–1.7)
Total Protein: 6.4 g/dL (ref 6.1–8.1)

## 2021-06-20 LAB — ANA, IFA COMPREHENSIVE PANEL
Anti Nuclear Antibody (ANA): POSITIVE — AB
ENA SM Ab Ser-aCnc: <1 AI
SM/RNP: <1 AI
SSA (Ro) (ENA) Antibody, IgG: <1 AI
SSB (La) (ENA) Antibody, IgG: <1 AI
Scleroderma (Scl-70) (ENA) Antibody, IgG: <1 AI
ds DNA Ab: <1 IU/mL

## 2021-06-20 LAB — IRON,TIBC AND FERRITIN PANEL
%SAT: 42 % (calc) (ref 20–48)
Ferritin: 128 ng/mL (ref 24–380)
Iron: 106 ug/dL (ref 50–180)
TIBC: 252 mcg/dL (calc) (ref 250–425)

## 2021-06-20 LAB — ANTI-NUCLEAR AB-TITER (ANA TITER): ANA Titer 1: 1:80 {titer} — ABNORMAL HIGH

## 2021-06-20 NOTE — Telephone Encounter (Signed)
Spoke with pt regarding labs and instructions.   

## 2021-06-21 ENCOUNTER — Telehealth: Payer: Self-pay | Admitting: Family Medicine

## 2021-06-21 NOTE — Telephone Encounter (Signed)
Spoke with pt regarding labs and instructions.   

## 2021-06-21 NOTE — Telephone Encounter (Signed)
Please inform patient: ?The last test completed called the SPEP came back with a potential abnormality. ?There was a faint band noted in the result of this test. ?We will need to collect additional labs to confirm. ?I would like him to schedule an appointment at his earliest convenience so that we can discuss the results in greater detail, instruct on additional tests required and further plan. ? ?This particular test is for multiple myeloma and MGUS.  Which can cause unintentional weight loss and weakness, which were his complaints.  Multiple while myeloma is a form of cancer, MGUS is not cancer but can progress to multiple myeloma.  Therefore it is very important to complete the work-up. ? ?

## 2021-06-26 ENCOUNTER — Ambulatory Visit (INDEPENDENT_AMBULATORY_CARE_PROVIDER_SITE_OTHER): Payer: Medicare HMO | Admitting: Family Medicine

## 2021-06-26 ENCOUNTER — Encounter: Payer: Self-pay | Admitting: Family Medicine

## 2021-06-26 VITALS — BP 121/56 | HR 53 | Temp 97.4°F | Ht 65.0 in | Wt 154.0 lb

## 2021-06-26 DIAGNOSIS — R5383 Other fatigue: Secondary | ICD-10-CM | POA: Diagnosis not present

## 2021-06-26 DIAGNOSIS — R634 Abnormal weight loss: Secondary | ICD-10-CM

## 2021-06-26 DIAGNOSIS — R778 Other specified abnormalities of plasma proteins: Secondary | ICD-10-CM | POA: Diagnosis not present

## 2021-06-26 NOTE — Progress Notes (Signed)
? ? ? ?This visit occurred during the SARS-CoV-2 public health emergency.  Safety protocols were in place, including screening questions prior to the visit, additional usage of staff PPE, and extensive cleaning of exam room while observing appropriate contact time as indicated for disinfecting solutions.  ? ? ?Donald [name] , [birth year], 76 y.o., male ?MRN: [id number] ?Patient Care Team  ?  Relationship Specialty Notifications Start End  ?Ma Hillock, DO PCP - General Family Medicine  12/11/18   ?Jettie Booze, MD PCP - Cardiology Cardiology Admissions 11/25/18   ?Thompson Grayer, MD PCP - Electrophysiology Cardiology Admissions 11/25/18   ?Ulyses Southward., MD Consulting Physician Urology  09/09/14   ?Jettie Booze, MD Consulting Physician Cardiology  09/09/14   ?Alexis Frock, MD Consulting Physician Urology  01/12/19   ?Jarome Matin, MD Consulting Physician Dermatology  01/12/19   ? ? ?Chief Complaint  ?Patient presents with  ? Fatigue  ?  F/u  ? ?  ?Subjective: Pt presents for an OV to discuss his laboratory results in detail and further plan.  His wife is also with him today.  Briefly he reported having unintentional weight loss since last September, that was actually due to being hospitalized at that time after surgery with an infection.  He states his concern is that he has not been able to gain the weight loss back. Body mass index is 25.63 kg/m?Marland Kitchen  He felt he lost a great deal of muscle mass and was having more weakness.  Work-up was initiated for him after that visit which included a mildly decreased GFR at 54.85, which was new for him. ?Normal: Iron panel, glucose, TSH. ?Mildly low B12 at 389 and mildly low vitamin D at 24. ?ANA was positive, with a very low titer of 1: 80 and a nuclear speckled pattern and normal reflex panel. ?CBC had improved from last collection after surgery, but did result with mild anemia at hemoglobin 12.2/hematocrit 37.3/RBC 4.08.  This however is an improvement from his  usual baseline. ?SPEP normal with the exception of mildly low albumin ELP at 3.7 (3.8 and up is normal).  Valuation also revealed a faint restricted M spike band migrated in the gammaglobulin region.   ?Patient has a significant medical history of renal cell carcinoma and coronary artery disease with bypass graft after history of MI. ? ?Prior note: ?With complaints of fatigue and unintentional weight loss of 15 pounds since September.  Patient was hospitalized in September after having surgery on his knee.  He reports he was hospitalized for about 2 weeks and during that time he lost a great deal of muscle mass.  EMR review in September he weighed 175 pounds and he is weighing 151 pounds today.  He reports he feels fine other than some fatigue.  He denies any chest pain or shortness of breath.  His bowel movements are normal without any changes or melena.  He follows with urology closely for his renal cell carcinoma history.  He denies any bone pain or back pain.  He had an echo in September 2022 which was good.  He follows with cardiology.  He reports he completed physical therapy after his knee surgery.  He is going to the gym about 3 times a week and working out both with cardiovascular exercise and light weights.  He states he has been able to maintain his weight around 150 pounds, but he has not been able to gain weight despite trying to increase his calories. ?He reports  there was 1 episode when he got up to walk to his patio that he became dizzy.  He states the room started to spin a little and his vision got blurry.  He leaned against the banister and symptoms resolved with then 30 seconds.  He denies any other associated symptoms with this event such as chest pain, shortness of breath, diaphoresis. ?In September he was started on amiodarone, Eliquis and midodrine for atrial fibrillation became persistent after surgery.  He has a history of A-fib, a flutter, history of MI/CAD, acquired thrombophilia-Eliquis  use ?He reports he went and came in today, except for his wife has been encouraging him to be seen and is concerned of his weight loss. ? ? ?  06/07/2021  ?  3:11 PM 01/20/2021  ? 10:11 AM 06/01/2020  ?  2:20 PM 01/12/2019  ?  1:33 PM 09/09/2014  ?  8:24 AM  ?Depression screen PHQ 2/9  ?Decreased Interest 0 0 0 0 0  ?Down, Depressed, Hopeless 0 0 0 0 0  ?PHQ - 2 Score 0 0 0 0 0  ? ? ?No Known Allergies ?Social History  ? ?Social History Narrative  ? 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   ? Home Situation: lives with wife  ?   ? Spiritual Beliefs: Christian  ?   ? Lifestyle: tries to make it to the gym 5 days per week; healthy diet  ?   -smoke alarm in the home:Yes  ?   - wears seatbelt: Yes  ?   - Feels safe in their relationships: Yes  ?   ?   ? ?Past Medical History:  ?Diagnosis Date  ? Age-related nuclear cataract, bilateral 06/29/2020  ? Allergic rhinitis   ? BPH (benign prostatic hyperplasia)   ? Chicken pox   ? Coronary artery disease   ? a. MI 2003 with CABG.  ? Hematuria   ? History of basal cell carcinoma (BCC) excision   ? left upper back, right chest area  ? History of colon polyps   ? History of kidney stones   ? History of total knee arthroplasty 11/30/2020  ? Hydronephrosis, right   ? Incomplete right bundle branch block (RBBB)   ? Myocardial infarction [facility])   ? 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 Osteoarthritis of knee 05/13/2017  ? Renal cell carcinoma ([place]) 02/04/2019  ? Partial nephrectomyKIDNEY, LEFT, PARTIAL NEPHRECTOMY:   ? Right ureteral stone   ? S/P ablation of atrial flutter   ? followed by dr Rayann Heman ([facility] cardiolgoist)  ablation 04-17-2004  and by dr allred 02-10- 2011 was successful  ? S/P CABG x 5 08-05-2001   in [place] , [place]  ? LIMA to LAD,  seqVG to Diagonal,  SeqVG to OM,  seqVG to PDA and PLA  ? Seasonal allergies   ? Sinus bradycardia   ? Wears glasses   ? ?Past Surgical History:  ?Procedure Laterality Date  ? CARDIAC CATHETERIZATION  08-04-2001   in [place], [place]  ? severe 3 vessel cad  ? CARDIAC ELECTROPHYSIOLOGY MAPPING AND ABLATION  x2  04-17-2004 [place], [place]);  04-14-2009 by dr allred  ? per dr allred report-- successful ablation clockwise isthmus-dependent right atrial flutter along the usual cavotricupid isthmus;  complete bidirectionl isthmus block achieved  ? CORONARY ARTERY BYPASS GRAFT  08-06-2003  in [place], [place]  ? x5-- LIMA - LAD,  seqVG to Diagonal, OM, PDA, and PLA  ?  CYSTOSCOPY WITH RETROGRADE PYELOGRAM, URETEROSCOPY AND STENT PLACEMENT Right 08/10/2016  ? Procedure: CYSTOSCOPY WITH RETROGRADE PYELOGRAM, URETEROSCOPY AND STENT PLACEMENT;  Surgeon: Alexis Frock, MD;  Location: [facility];  Service: Urology;  Laterality: Right;  ? FIXATION RIGHT WRIST Right 2011  ? stability for arthritis  ? INGUINAL HERNIA REPAIR Right 08-08-2015   [facility]- health care at [place]  ? ROBOTIC ASSITED PARTIAL NEPHRECTOMY Left 02/04/2019  ? Procedure: XI ROBOTIC ASSITED LEFT PARTIAL NEPHRECTOMY; LEFT PARTIAL ADRENALECTOMY;  Surgeon: Alexis Frock, MD;  [facility];  Service: Urology;  Laterality: Left;  3 HRS  ? ROTATOR CUFF REPAIR Right 2004;  2007  ? TOTAL KNEE ARTHROPLASTY Left 11/30/2020  ? Procedure: TOTAL KNEE ARTHROPLASTY;  Surgeon: Susa Day, MD;  Location: [facility];  Service: Orthopedics;  Laterality: Left;  ? WISDOM TOOTH EXTRACTION    ? ?Family History  ?Problem Relation Age of Onset  ? Heart attack Mother   ? Alzheimer's disease Mother   ? Cancer Father   ?     throat cancer, chewing tobacco  ? Hypertension Neg Hx   ? ?Allergies as of 06/26/2021   ?No Known Allergies ?  ? ?  ?Medication List  ?  ? ?  ? Accurate as of June 26, 2021 11:59 PM. If you have any questions, ask your nurse or doctor.  ?  ?  ? ?  ? ?amiodarone 200 MG tablet ?Commonly known as: PACERONE ?Take 1 tablet by mouth once daily ?  ?atorvastatin 40 MG tablet ?Commonly known as: LIPITOR ?Take 40 mg by mouth daily. ?  ?Eliquis 5 MG Tabs tablet ?Generic drug:  apixaban ?Take 1 tablet by mouth twice daily ?  ?midodrine 5 MG tablet ?Commonly known as: PROAMATINE ?Take 2 mg by mouth 3 (three) times daily. ?  ?MULTIVITAMIN ADULT PO ?Take 1 tablet by mouth daily.

## 2021-06-26 NOTE — Patient Instructions (Addendum)
24-Hour Urine Collection ?Why am I having this test? ?A 24-hour urine specimen is a lab test that requires you to collect all of your urine for an entire day. This is sometimes called a timed urine test. It can provide more information than a single urine sample. ?There are many reasons to have this test. Your health care provider may order the test to check for or monitor the following conditions: ?High blood pressure. ?Kidney disease. ?Kidney stones. ?Urinary tract infections. ?Pregnancy. ?Diabetes. ?How do I prepare for this test? ?You may be asked to follow a special diet during or before the collection period. Follow any instructions from your health care provider. If no special instructions are given, you may eat and drink normally. ?Take over-the-counter and prescription medicines only as told by your health care provider. ?Let your health care provider know about any medicines that you are taking, including over-the-counter medicines, vitamins, herbs, and supplements. ?Choose a collection day when you can be at home or when you have a place to store the urine. All urine must be collected during the testing period. ?How do I do a 24-hour urine collection? ? ?When you get up in the morning, urinate in the toilet and flush. Write down the time. This will be your start time on the day of collection and your end time on the next morning. ?From the start time on, all of your urine should be kept in the collection jug that you received from the lab. ?If the jug that is given to you already has liquid in it, that is okay. Do not throw out the liquid or rinse out the jug. ?Urinate into a specimen container, such as a urinal or pan that sits over the toilet. Pour the urine from the container into the collection jug. Be careful not to spill any of the urine. Use the equipment provided by the lab. ?Do not let any toilet paper or stool (feces) get into the jug. This will contaminate the sample. ?Stop collecting your  urine 24 hours after you started. Collect the last specimen as close as possible to the end of the 24-hour period. ?Keep the jug cool in an ice chest or keep it in the refrigerator during collection. ?When the 24-hour collection is complete, take the jug to the lab as soon as possible. Keep the jug cool in an ice chest while you are bringing it to the lab. ?What do the results mean? ?Talk with your health care provider about what your results mean. ?Questions to ask your health care provider ?Ask your health care provider, or the department that is doing the test: ?When will my results be ready? ?How will I get my results? ?What are my treatment options? ?What other tests do I need? ?What are my next steps? ?Summary ?A 24-hour urine specimen is a lab test that requires you to collect all of your urine for an entire day. ?When you get up in the morning, urinate in the toilet and flush. Write down the time. For the next 24 hours, collect all of your urine in the collection jug that you received from the lab. ?Keep the jug cool while collecting the urine and while bringing it back to the lab. ?Take the jug of urine back to the lab as soon as possible after the collection period has ended. ?This information is not intended to replace advice given to you by your health care provider. Make sure you discuss any questions you have with your health  care provider. ?Document Revised: 08/26/2020 Document Reviewed: 08/26/2020 ?Elsevier Patient Education ? [place]. ? ? ?Monoclonal Gammopathy of Undetermined Significance ?Monoclonal gammopathy of undetermined significance (MGUS) is a condition in which there is too much of a protein called monoclonal protein, or M protein, in the blood. MGUS can cause you to have too many cells in your blood and not enough space for healthy cells. This condition does not cause symptoms, but it may increase your risk of developing multiple myeloma or other blood disorders in the  future. ?What are the causes? ?The cause of this condition is not known. Genetics and the environment may play a role. ?What increases the risk? ?You are more likely to develop this condition if: ?You are African American. ?You are age 62 or older. ?You are male. ?You have an autoimmune disease. ?You have been exposed to radiation. ?You have a family history of MGUS. ?What are the signs or symptoms? ?There are no symptoms of this condition. ?How is this diagnosed? ? ?This condition may be diagnosed with a blood test that checks for M protein. ?How is this treated? ?Treatment may involve monitoring your condition. This may include: ?Having regular exams. This will allow your health care provider to monitor your health. ?Having tests done regularly, such as: ?Blood tests to check for M protein in your body. ?Imaging tests, such as a CT scan. ?A bone marrow biopsy. This test involves taking a sample of bone marrow from your body so it can be looked at under a microscope. ?Follow these instructions at home: ?Keep all follow-up visits as told by your health care provider. This is important. ?Contact a health care provider if: ?You have trouble swallowing. ?You have pain in your back or ribs. ?You have a fever. ?You are bruising easily. ?Get help right away if: ?You break a bone. ?You have trouble breathing. ?Summary ?Monoclonal gammopathy of undetermined significance (MGUS) is a condition in which there is too much of a protein called monoclonal protein, or M protein, in the blood. ?This condition may be diagnosed with a blood test that checks for M protein. ?Treatment for this condition may involve having tests done regularly. Tests may include blood tests, imaging tests, and a bone marrow biopsy. ?This information is not intended to replace advice given to you by your health care provider. Make sure you discuss any questions you have with your health care provider. ?Document Revised: 01/03/2021 Document Reviewed:  01/08/2019 ?Elsevier Patient Education ? [place]. ? ?

## 2021-06-30 ENCOUNTER — Ambulatory Visit (INDEPENDENT_AMBULATORY_CARE_PROVIDER_SITE_OTHER): Payer: Medicare HMO

## 2021-06-30 DIAGNOSIS — R778 Other specified abnormalities of plasma proteins: Secondary | ICD-10-CM

## 2021-06-30 LAB — COMPREHENSIVE METABOLIC PANEL
ALT: 17 U/L (ref 0–53)
AST: 18 U/L (ref 0–37)
Albumin: 4 g/dL (ref 3.5–5.2)
Alkaline Phosphatase: 63 U/L (ref 39–117)
BUN: 24 mg/dL — ABNORMAL HIGH (ref 6–23)
CO2: 30 mEq/L (ref 19–32)
Calcium: 8.9 mg/dL (ref 8.4–10.5)
Chloride: 104 mEq/L (ref 96–112)
Creatinine, Ser: 1.13 mg/dL (ref 0.40–1.50)
GFR: 62.49 mL/min (ref >60.00)
Glucose, Bld: 110 mg/dL — ABNORMAL HIGH (ref 70–99)
Potassium: 4.6 mEq/L (ref 3.5–5.1)
Sodium: 140 mEq/L (ref 135–145)
Total Bilirubin: 0.7 mg/dL (ref 0.2–1.2)
Total Protein: 6.3 g/dL (ref 6.0–8.3)

## 2021-07-04 LAB — UPEP/UIFE/LIGHT CHAINS/TP, 24-HR UR
% BETA, Urine: 0 %
ALBUMIN, U: 100 %
ALPHA 1 URINE: 0 %
ALPHA-2-GLOBULIN, U: 0 %
Free Kappa Lt Chains,Ur: 31.31 mg/L (ref 1.17–86.46)
Free Lambda Lt Chains,Ur: 2.29 mg/L (ref 0.27–15.21)
GAMMA GLOBULIN URINE: 0 %
Kappa/Lambda Ratio,U: 13.67 (ref 1.83–14.26)
Protein, 24H Urine: 110 mg/24 hr (ref 30–150)
Protein, Ur: 7.3 mg/dL

## 2021-08-15 DIAGNOSIS — C642 Malignant neoplasm of left kidney, except renal pelvis: Secondary | ICD-10-CM | POA: Diagnosis not present

## 2021-08-17 ENCOUNTER — Other Ambulatory Visit (HOSPITAL_COMMUNITY): Payer: Self-pay | Admitting: Physician Assistant

## 2021-08-17 DIAGNOSIS — M25562 Pain in left knee: Secondary | ICD-10-CM | POA: Diagnosis not present

## 2021-08-18 ENCOUNTER — Other Ambulatory Visit (HOSPITAL_COMMUNITY): Payer: Self-pay | Admitting: Urology

## 2021-08-18 ENCOUNTER — Ambulatory Visit (HOSPITAL_COMMUNITY)
Admission: RE | Admit: 2021-08-18 | Discharge: 2021-08-18 | Disposition: A | Discharge status: Home / Self Care | Payer: Medicare HMO | Source: Ambulatory Visit | Attending: Urology | Admitting: Urology

## 2021-08-18 DIAGNOSIS — C642 Malignant neoplasm of left kidney, except renal pelvis: Secondary | ICD-10-CM | POA: Diagnosis not present

## 2021-08-18 DIAGNOSIS — J439 Emphysema, unspecified: Secondary | ICD-10-CM | POA: Diagnosis not present

## 2021-08-22 DIAGNOSIS — C642 Malignant neoplasm of left kidney, except renal pelvis: Secondary | ICD-10-CM | POA: Diagnosis not present

## 2021-08-22 DIAGNOSIS — N200 Calculus of kidney: Secondary | ICD-10-CM | POA: Diagnosis not present

## 2021-08-22 DIAGNOSIS — Z85528 Personal history of other malignant neoplasm of kidney: Secondary | ICD-10-CM | POA: Diagnosis not present

## 2021-08-22 DIAGNOSIS — I700 Atherosclerosis of aorta: Secondary | ICD-10-CM | POA: Diagnosis not present

## 2021-08-22 DIAGNOSIS — K8020 Calculus of gallbladder without cholecystitis without obstruction: Secondary | ICD-10-CM | POA: Diagnosis not present

## 2021-09-18 DIAGNOSIS — S81811A Laceration without foreign body, right lower leg, initial encounter: Secondary | ICD-10-CM | POA: Diagnosis not present

## 2021-10-11 DIAGNOSIS — D2239 Melanocytic nevi of other parts of face: Secondary | ICD-10-CM | POA: Diagnosis not present

## 2021-10-11 DIAGNOSIS — Z85828 Personal history of other malignant neoplasm of skin: Secondary | ICD-10-CM | POA: Diagnosis not present

## 2021-10-11 DIAGNOSIS — D485 Neoplasm of uncertain behavior of skin: Secondary | ICD-10-CM | POA: Diagnosis not present

## 2021-10-11 DIAGNOSIS — L812 Freckles: Secondary | ICD-10-CM | POA: Diagnosis not present

## 2021-10-11 DIAGNOSIS — L570 Actinic keratosis: Secondary | ICD-10-CM | POA: Diagnosis not present

## 2021-10-11 DIAGNOSIS — L820 Inflamed seborrheic keratosis: Secondary | ICD-10-CM | POA: Diagnosis not present

## 2021-10-11 DIAGNOSIS — L821 Other seborrheic keratosis: Secondary | ICD-10-CM | POA: Diagnosis not present

## 2021-10-11 DIAGNOSIS — C44519 Basal cell carcinoma of skin of other part of trunk: Secondary | ICD-10-CM | POA: Diagnosis not present

## 2021-10-25 NOTE — Progress Notes (Signed)
Cardiology Office Note   Date:  10/27/2021   ID:  Donald [name], DOB 15-Apr-1945, MRN [id number]  PCP:  Ma Hillock, DO    No chief complaint on file.  CAD  Wt Readings from Last 3 Encounters:  10/27/21 155 lb (70.3 kg)  06/26/21 154 lb (69.9 kg)  06/16/21 151 lb (68.5 kg)       History of Present Illness: Donald [name] is a 76 y.o. male  who has CAD, CABG in 2001and a h/o AFlutter. He had an ablation for atrial flutter in 2011, and several years before that in [place].  He is off of coumadin.    Kidney stone in 2018- Dr. Tresa Moore. He had a tumor removed from his left kidney in 2020.   Bradycardia in the 40-50s range over the past several years (2017-2021).    2021 monitor was reassuring.   Had orthostasis in 10/21.  Midodrine was started due to some BP readings in the 80s.    He did not use the midodrine as scheduled.  He forgot doses.    He thought he needed to drink more water as well. He cut down on sodas.   He lost weight.    Had left knee surgery, TKR in 11/2020. Developed AFib.  Prolonged hospital stay, 2 weeks.  He lost muscle mass and had a severe IV infiltration.  Seen in AFib clinic: "He developed afib postop with rapid rates. He did report symptoms of dizziness when in afib with RVR. He was started on diltiazem which did convert him to SR but he had issues with hypotension. His afib recurred and he was loaded on amiodarone. He was also started on Eliquis for a CHADS2VASC score of 3. In hindsight, patient has had episodes of periodic dizziness before his knee surgery....  The patient's CHA2DS2-VASc score is 3, indicating a 3.2% annual risk of stroke.   S/p flutter ablation 2011 Patient appears to be maintaining SR. Continue amiodarone 200 mg daily Check cmet/TSH today. Continue Eliquis 5 mg BID"  No bleeding issues on Eliquis.  Easy bruising noted.   Denies : Chest pain. Dizziness. Leg edema. Nitroglycerin use. Orthopnea. Palpitations. Paroxysmal nocturnal  dyspnea. Shortness of breath. Syncope.     Past Medical History:  Diagnosis Date   Age-related nuclear cataract, bilateral 06/29/2020   Allergic rhinitis    BPH (benign prostatic hyperplasia)    Chicken pox    Coronary artery disease    a. MI 2003 with CABG.   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    Osteoarthritis of knee 05/13/2017   Renal cell carcinoma ([place]) 02/04/2019   Partial nephrectomyKIDNEY, LEFT, PARTIAL NEPHRECTOMY:    Right ureteral stone    S/P ablation of atrial flutter    followed by dr Rayann Heman ([facility] cardiolgoist)  ablation 04-17-2004  and by dr allred 02-10- 2011 was successful   S/P CABG x 5 08-05-2001   in [place] , [place]   LIMA to LAD,  seqVG to Diagonal,  SeqVG to OM,  seqVG to PDA and PLA   Seasonal allergies    Sinus bradycardia  Wears glasses     Past Surgical History:  Procedure Laterality Date   CARDIAC CATHETERIZATION  08-04-2001  in [place], [place]   severe 3 vessel cad   CARDIAC ELECTROPHYSIOLOGY [place]  x2  04-17-2004 [place], [place]);  04-14-2009 by dr Rayann Heman   per dr allred report-- successful ablation clockwise isthmus-dependent right atrial flutter along the usual cavotricupid isthmus;  complete bidirectionl isthmus block achieved   CORONARY ARTERY BYPASS GRAFT  08-06-2003  in [place], [place]   x5-- LIMA - LAD,  seqVG to Diagonal, OM, PDA, and PLA   CYSTOSCOPY WITH RETROGRADE PYELOGRAM, URETEROSCOPY AND STENT PLACEMENT Right 08/10/2016   Procedure: CYSTOSCOPY WITH RETROGRADE PYELOGRAM, URETEROSCOPY AND STENT PLACEMENT;  Surgeon: Alexis Frock, MD;  Location: [facility];  Service: Urology;  Laterality: Right;   FIXATION RIGHT  WRIST Right 2011   stability for arthritis   INGUINAL HERNIA REPAIR Right 08-08-2015   [facility] [place] Left 02/04/2019   Procedure: XI ROBOTIC ASSITED LEFT PARTIAL NEPHRECTOMY; LEFT PARTIAL ADRENALECTOMY;  Surgeon: Alexis Frock, MD;  Location: [facility];  Service: Urology;  Laterality: Left;  3 HRS   ROTATOR CUFF REPAIR Right 2004;  2007   TOTAL KNEE ARTHROPLASTY Left 11/30/2020   Procedure: TOTAL KNEE ARTHROPLASTY;  Surgeon: Susa Day, MD;  Location: [facility];  Service: Orthopedics;  Laterality: Left;   WISDOM TOOTH EXTRACTION       Current Outpatient Medications  Medication Sig Dispense Refill   amiodarone (PACERONE) 200 MG tablet Take 1 tablet by mouth once daily 30 tablet 3   apixaban (ELIQUIS) 5 MG TABS tablet Take 1 tablet by mouth twice daily 60 tablet 5   atorvastatin (LIPITOR) 40 MG tablet Take 40 mg by mouth daily.     midodrine (PROAMATINE) 5 MG tablet Take 2 mg by mouth 3 (three) times daily.     Multiple Vitamin (MULTIVITAMIN ADULT PO) Take 1 tablet by mouth daily.     Red Yeast Rice Extract (RED YEAST RICE PO) Take by mouth.     Zinc 50 MG TABS Take 1 tablet by mouth daily at 12 noon.     No current facility-administered medications for this visit.    Allergies:   Patient has no known allergies.    Social History:  The patient  reports that he has never smoked. He has been exposed to tobacco smoke. His smokeless tobacco use includes snuff. He reports that he does not currently use alcohol. He reports that he does not use drugs.   Family History:  The patient's family history includes Alzheimer's disease in his mother; Cancer in his father; Heart attack in his mother.    ROS:  Please see the history of present illness.   Otherwise, review of systems are positive for easy bruising.   All other systems are reviewed and negative.    PHYSICAL EXAM: VS:  BP 136/70 (BP Location: Left Arm, Patient  Position: Sitting, Cuff Size: Normal)   Pulse (!) 56   Ht '5\' 5"'$  (1.651 m)   Wt 155 lb (70.3 kg)   SpO2 96%   BMI 25.79 kg/m  , BMI Body mass index is 25.79 kg/m. GEN: Well nourished, well developed, in no acute distress HEENT: normal Neck: no JVD, carotid bruits, or masses Cardiac: RRR; no murmurs, rubs, or gallops,no edema  Respiratory:  clear to auscultation bilaterally, normal work of breathing GI: soft, nontender, nondistended, + BS MS: no deformity  or atrophy Skin: warm and dry, no rash Neuro:  Strength and sensation are intact Psych: euthymic mood, full affect   EKG:   The ekg ordered today demonstrates NSR, IRBBB   Recent Labs: 12/10/2020: Magnesium 2.0 06/16/2021: Hemoglobin 12.2; Platelets 209.0; TSH 0.99 06/30/2021: ALT 17; BUN 24; Creatinine, Ser 1.13; Potassium 4.6; Sodium 140   Lipid Panel    Component Value Date/Time   HDL 49 05/20/2014 0000   LDLCALC 59 05/20/2014 0000     Other studies Reviewed: Additional studies/ records that were reviewed today with results demonstrating: [facility]- Normal LFTs, Normal TSH in 06/2021.   ASSESSMENT AND PLAN:  CAD: s/p CABG.  No angina on medical therapy.  Continue aggressive secondary prevention. Atrial flutter: s/p ablation.  Has had PAF in the past.  Tolerating Amio which was started after he had a recurrence of atrial fibrillation at the time of his knee surgery.  Check liver and thyroid tests every 6 months.  No bleeding problems. Hyperlipidemia: LDL 79, HDL 56 total cholesterol 148 triglycerides 64.  Whole food, based diet.  High-fiber food.  Avoid processed recommended.  He has been trying to gain muscle.  Add more plant-based protein to diet.  Can use lighter weights with multiple repetitions. BPs at home have been normal.  Acquired thrombophilia in the setting of atrial fibrillation.  Tolerating Eliquis.  Hemoglobin stable based on labs from the [place].    Current medicines are reviewed at length with the  patient today.  The patient concerns regarding his medicines were addressed.  The following changes have been made:  No change  Labs/ tests ordered today include: needs TSH with PCP No orders of the defined types were placed in this encounter.   Recommend 150 minutes/week of aerobic exercise Low fat, low carb, high fiber diet recommended  Disposition:   FU in 1 year   Signed, Larae Grooms, MD  10/27/2021 9:47 AM    [facility] [address], [place], [place]  [address] Phone: [phone]; Fax: [phone]

## 2021-10-27 ENCOUNTER — Encounter: Payer: Self-pay | Admitting: Interventional Cardiology

## 2021-10-27 ENCOUNTER — Ambulatory Visit: Payer: Medicare HMO | Admitting: Interventional Cardiology

## 2021-10-27 VITALS — BP 136/70 | HR 56 | Ht 65.0 in | Wt 155.0 lb

## 2021-10-27 DIAGNOSIS — E785 Hyperlipidemia, unspecified: Secondary | ICD-10-CM

## 2021-10-27 DIAGNOSIS — D6869 Other thrombophilia: Secondary | ICD-10-CM

## 2021-10-27 DIAGNOSIS — I2510 Atherosclerotic heart disease of native coronary artery without angina pectoris: Secondary | ICD-10-CM | POA: Diagnosis not present

## 2021-10-27 DIAGNOSIS — Z951 Presence of aortocoronary bypass graft: Secondary | ICD-10-CM | POA: Diagnosis not present

## 2021-10-27 DIAGNOSIS — I480 Paroxysmal atrial fibrillation: Secondary | ICD-10-CM

## 2021-10-27 NOTE — Patient Instructions (Signed)

## 2021-11-02 DIAGNOSIS — C642 Malignant neoplasm of left kidney, except renal pelvis: Secondary | ICD-10-CM | POA: Diagnosis not present

## 2021-11-02 DIAGNOSIS — N202 Calculus of kidney with calculus of ureter: Secondary | ICD-10-CM | POA: Diagnosis not present

## 2021-12-07 DIAGNOSIS — Z96652 Presence of left artificial knee joint: Secondary | ICD-10-CM | POA: Diagnosis not present

## 2021-12-13 ENCOUNTER — Other Ambulatory Visit (HOSPITAL_COMMUNITY): Payer: Self-pay | Admitting: Physician Assistant

## 2021-12-21 DIAGNOSIS — M25562 Pain in left knee: Secondary | ICD-10-CM | POA: Diagnosis not present

## 2021-12-25 DIAGNOSIS — M25562 Pain in left knee: Secondary | ICD-10-CM | POA: Diagnosis not present

## 2021-12-28 DIAGNOSIS — M25562 Pain in left knee: Secondary | ICD-10-CM | POA: Diagnosis not present

## 2021-12-29 ENCOUNTER — Ambulatory Visit (INDEPENDENT_AMBULATORY_CARE_PROVIDER_SITE_OTHER): Payer: Medicare HMO

## 2021-12-29 DIAGNOSIS — Z23 Encounter for immunization: Secondary | ICD-10-CM | POA: Diagnosis not present

## 2022-01-01 DIAGNOSIS — M25562 Pain in left knee: Secondary | ICD-10-CM | POA: Diagnosis not present

## 2022-01-04 DIAGNOSIS — M25562 Pain in left knee: Secondary | ICD-10-CM | POA: Diagnosis not present

## 2022-01-08 DIAGNOSIS — M25562 Pain in left knee: Secondary | ICD-10-CM | POA: Diagnosis not present

## 2022-01-10 DIAGNOSIS — M25562 Pain in left knee: Secondary | ICD-10-CM | POA: Diagnosis not present

## 2022-01-15 DIAGNOSIS — M25562 Pain in left knee: Secondary | ICD-10-CM | POA: Diagnosis not present

## 2022-01-18 DIAGNOSIS — Z96652 Presence of left artificial knee joint: Secondary | ICD-10-CM | POA: Diagnosis not present

## 2022-02-05 ENCOUNTER — Other Ambulatory Visit: Payer: Self-pay | Admitting: Interventional Cardiology

## 2022-02-06 NOTE — Telephone Encounter (Signed)
Rx refill sent to pharmacy. 

## 2022-04-03 IMAGING — DX DG CHEST 2V
2 series · 2 of 2 positions shown · non-contrast
Comparison: 09/23/2007.

CLINICAL DATA: History of renal cell carcinoma.  Follow-up.

EXAM:
CHEST - 2 VIEW

[chest pa]
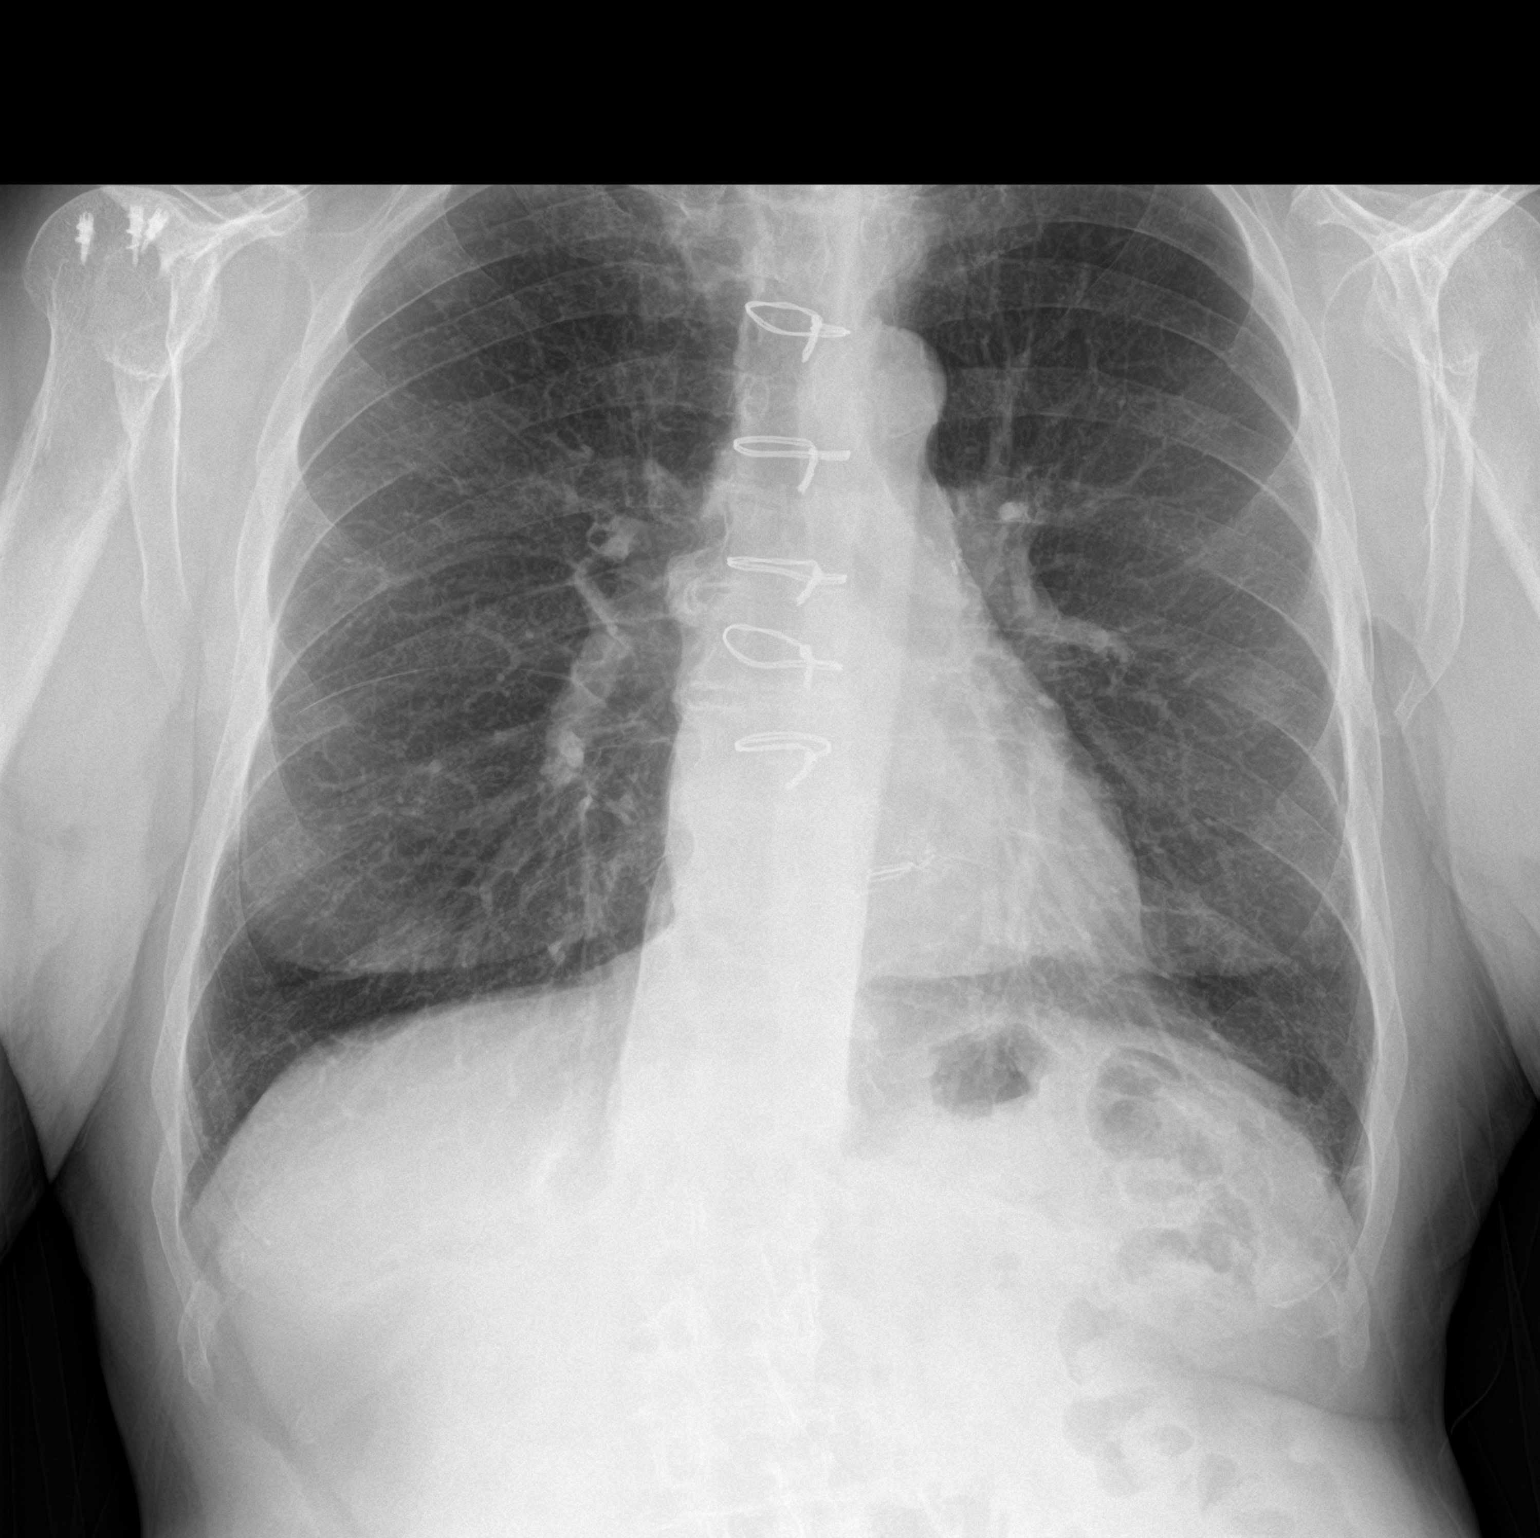

[chest lat]
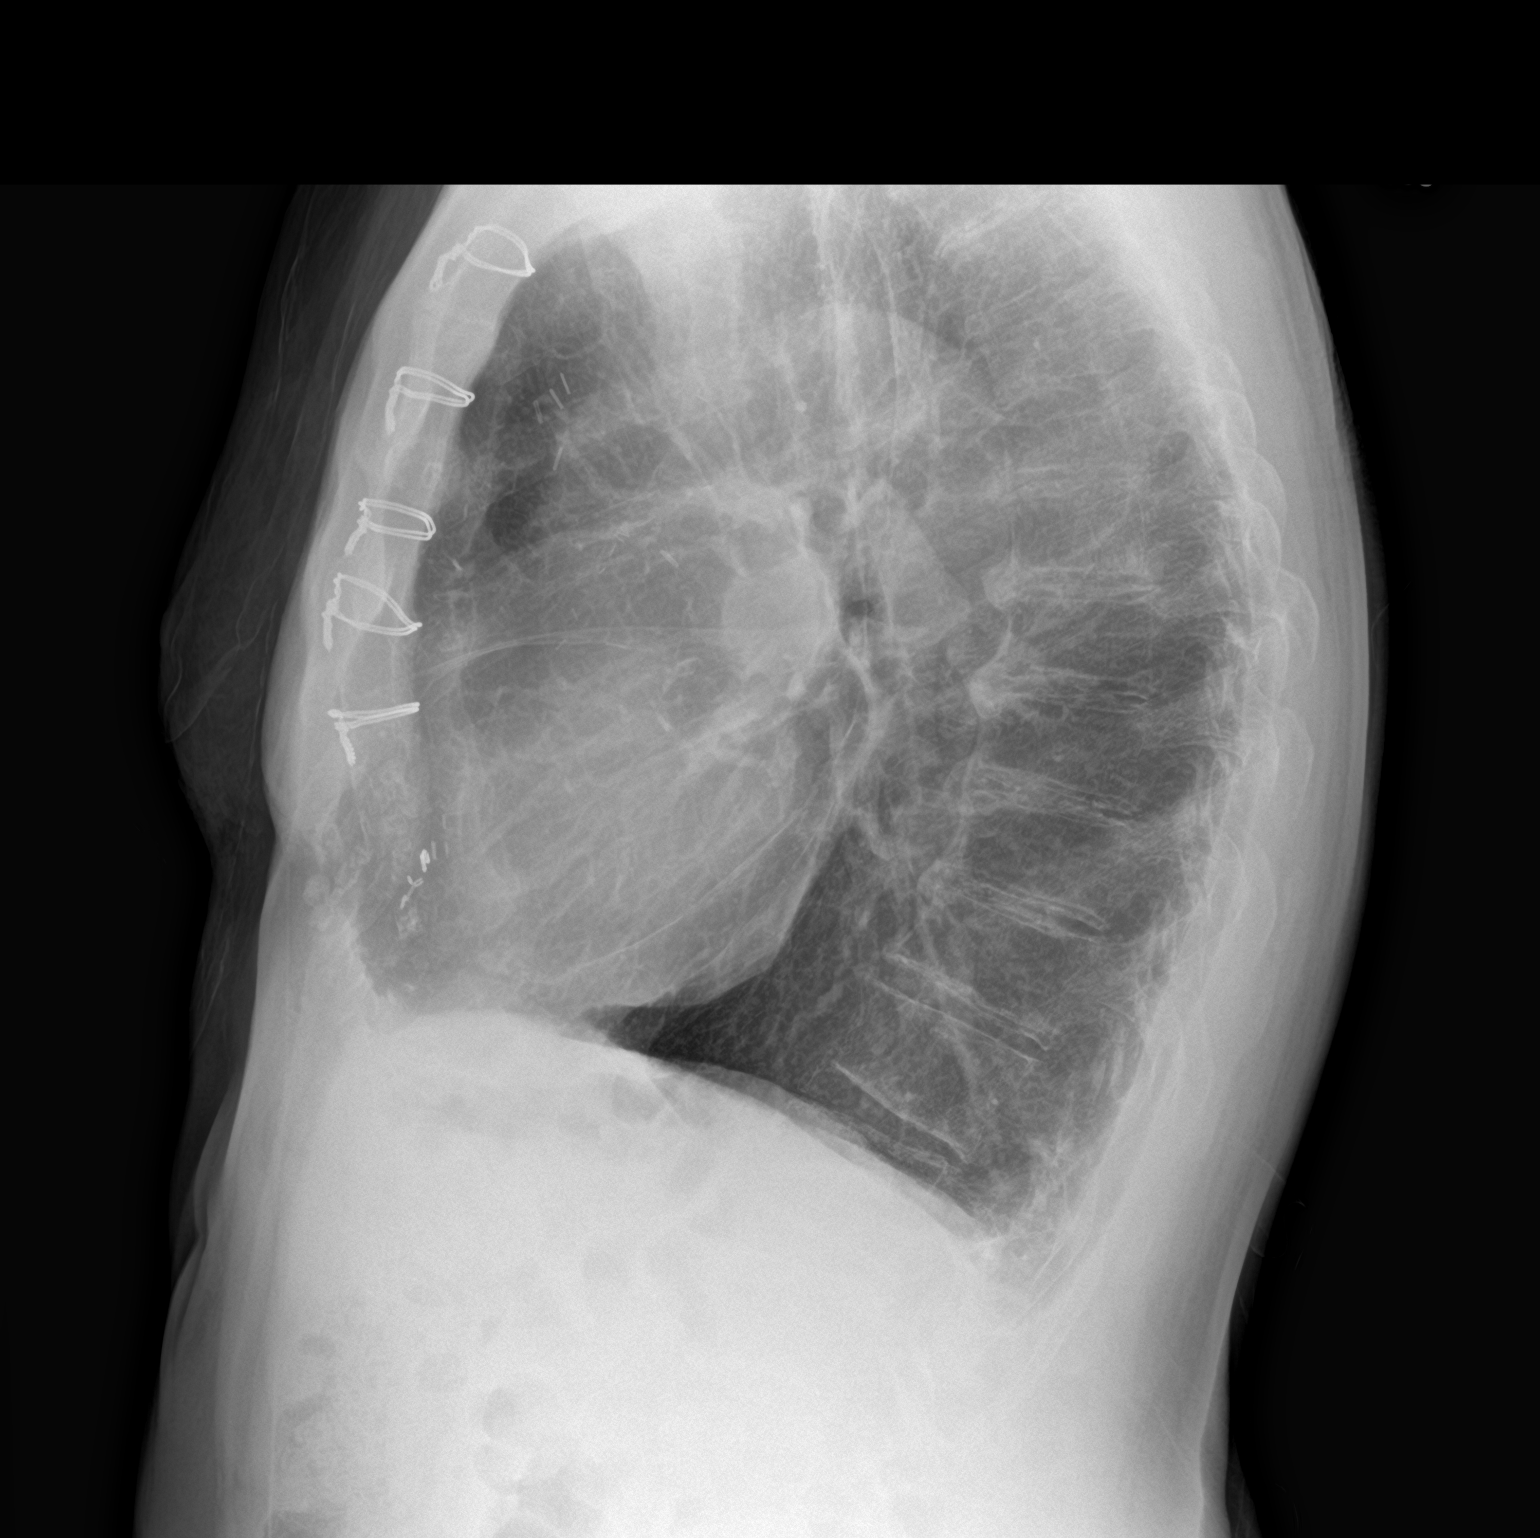

[2 of 2 positions shown; findings below may reference images not displayed]

FINDINGS: Previous median sternotomy. Heart size is normal. The lungs are
clear. No infiltrate, mass, collapse or effusion. Ordinary
degenerative changes affect the spine. No lytic lesions evident.
IMPRESSION: No active cardiopulmonary disease.  No sign of metastatic disease.

## 2022-04-16 ENCOUNTER — Other Ambulatory Visit (HOSPITAL_COMMUNITY): Payer: Self-pay | Admitting: *Deleted

## 2022-04-16 DIAGNOSIS — L821 Other seborrheic keratosis: Secondary | ICD-10-CM | POA: Diagnosis not present

## 2022-04-16 DIAGNOSIS — D485 Neoplasm of uncertain behavior of skin: Secondary | ICD-10-CM | POA: Diagnosis not present

## 2022-04-16 DIAGNOSIS — L570 Actinic keratosis: Secondary | ICD-10-CM | POA: Diagnosis not present

## 2022-04-16 DIAGNOSIS — Z85828 Personal history of other malignant neoplasm of skin: Secondary | ICD-10-CM | POA: Diagnosis not present

## 2022-04-16 DIAGNOSIS — C44519 Basal cell carcinoma of skin of other part of trunk: Secondary | ICD-10-CM | POA: Diagnosis not present

## 2022-04-16 MED ORDER — AMIODARONE HCL 200 MG PO TABS: 200.0000 mg | ORAL_TABLET | Freq: Every day | ORAL | 5 refills | Status: DC

## 2022-05-03 DIAGNOSIS — I4891 Unspecified atrial fibrillation: Secondary | ICD-10-CM | POA: Diagnosis not present

## 2022-05-03 DIAGNOSIS — E785 Hyperlipidemia, unspecified: Secondary | ICD-10-CM | POA: Diagnosis not present

## 2022-05-03 DIAGNOSIS — G8929 Other chronic pain: Secondary | ICD-10-CM | POA: Diagnosis not present

## 2022-05-03 DIAGNOSIS — F17200 Nicotine dependence, unspecified, uncomplicated: Secondary | ICD-10-CM | POA: Diagnosis not present

## 2022-05-03 DIAGNOSIS — E663 Overweight: Secondary | ICD-10-CM | POA: Diagnosis not present

## 2022-05-03 DIAGNOSIS — I959 Hypotension, unspecified: Secondary | ICD-10-CM | POA: Diagnosis not present

## 2022-05-14 ENCOUNTER — Encounter (HOSPITAL_COMMUNITY): Payer: Self-pay | Admitting: Physician Assistant

## 2022-05-14 ENCOUNTER — Ambulatory Visit (HOSPITAL_COMMUNITY)
Admission: RE | Admit: 2022-05-14 | Discharge: 2022-05-14 | Disposition: A | Discharge status: Home / Self Care | Payer: HMO | Source: Ambulatory Visit | Attending: Physician Assistant | Admitting: Physician Assistant

## 2022-05-14 VITALS — BP 130/70 | HR 57 | Ht 65.0 in | Wt 161.2 lb

## 2022-05-14 DIAGNOSIS — J449 Chronic obstructive pulmonary disease, unspecified: Secondary | ICD-10-CM | POA: Diagnosis not present

## 2022-05-14 DIAGNOSIS — D6869 Other thrombophilia: Secondary | ICD-10-CM | POA: Insufficient documentation

## 2022-05-14 DIAGNOSIS — Z5181 Encounter for therapeutic drug level monitoring: Secondary | ICD-10-CM | POA: Diagnosis not present

## 2022-05-14 DIAGNOSIS — I480 Paroxysmal atrial fibrillation: Secondary | ICD-10-CM | POA: Insufficient documentation

## 2022-05-14 DIAGNOSIS — I4892 Unspecified atrial flutter: Secondary | ICD-10-CM | POA: Insufficient documentation

## 2022-05-14 DIAGNOSIS — I2510 Atherosclerotic heart disease of native coronary artery without angina pectoris: Secondary | ICD-10-CM | POA: Insufficient documentation

## 2022-05-14 DIAGNOSIS — I4510 Unspecified right bundle-branch block: Secondary | ICD-10-CM | POA: Diagnosis not present

## 2022-05-14 DIAGNOSIS — R001 Bradycardia, unspecified: Secondary | ICD-10-CM | POA: Diagnosis not present

## 2022-05-14 DIAGNOSIS — Z79899 Other long term (current) drug therapy: Secondary | ICD-10-CM | POA: Insufficient documentation

## 2022-05-14 LAB — COMPREHENSIVE METABOLIC PANEL
ALT: 25 U/L (ref 0–44)
AST: 26 U/L (ref 15–41)
Albumin: 3.6 g/dL (ref 3.5–5.0)
Alkaline Phosphatase: 70 U/L (ref 38–126)
Anion gap: 9 (ref 5–15)
BUN: 32 mg/dL — ABNORMAL HIGH (ref 8–23)
CO2: 21 mmol/L — ABNORMAL LOW (ref 22–32)
Calcium: 9.2 mg/dL (ref 8.9–10.3)
Chloride: 111 mmol/L (ref 98–111)
Creatinine, Ser: 1.32 mg/dL — ABNORMAL HIGH (ref 0.61–1.24)
GFR, Estimated: 55 mL/min — ABNORMAL LOW (ref >60)
Glucose, Bld: 86 mg/dL (ref 70–99)
Potassium: 5.2 mmol/L — ABNORMAL HIGH (ref 3.5–5.1)
Sodium: 141 mmol/L (ref 135–145)
Total Bilirubin: 0.4 mg/dL (ref 0.3–1.2)
Total Protein: 6.4 g/dL — ABNORMAL LOW (ref 6.5–8.1)

## 2022-05-14 LAB — TSH: TSH: 0.949 u[IU]/mL (ref 0.350–4.500)

## 2022-05-14 MED ORDER — AMIODARONE HCL 200 MG PO TABS: 200.0000 mg | ORAL_TABLET | Freq: Every day | ORAL | 2 refills | Status: DC

## 2022-05-14 NOTE — Progress Notes (Signed)
Primary Care Physician: Ma Hillock, DO Primary Cardiologist: Dr Irish Lack  Primary Electrophysiologist: Dr Rayann Heman (remotely) Referring Physician: Dr Hilda Blades is a 77 y.o. male with a history of atrial flutter s/p ablation, atrial fibrillation CAD s/p CABG 2001, HLD, renal cancer s/p L nephrectomy who presents for follow up in the [facility].  The patient was initially diagnosed with atrial fibrillation 12/02/20 after presenting for knee replacement surgery. He developed afib postop with rapid rates. He did report symptoms of dizziness when in afib with RVR. He was started on diltiazem which did convert him to SR but he had issues with hypotension. His afib recurred and he was loaded on amiodarone. He was also started on Eliquis for a CHADS2VASC score of 3. In hindsight, patient has had episodes of periodic dizziness before his knee surgery.   On follow up today, patient reports that he has done well since his last visit. He denies any tachypalpitations or bleeding issues.   Today, he denies symptoms of palpitations, chest pain, shortness of breath, orthopnea, PND, lower extremity edema, dizziness, presyncope, syncope, snoring, daytime somnolence, bleeding, or neurologic sequela. The patient is tolerating medications without difficulties and is otherwise without complaint today.    Atrial Fibrillation Risk Factors:  he does not have symptoms or diagnosis of sleep apnea. he does not have a history of rheumatic fever.   he has a BMI of Body mass index is 26.83 kg/m.Marland Kitchen Filed Weights   05/14/22 1022  Weight: 73.1 kg    Family History  Problem Relation Age of Onset   Heart attack Mother    Alzheimer's disease Mother    Cancer Father        throat cancer, chewing tobacco   Hypertension Neg Hx      Atrial Fibrillation Management history:  Previous antiarrhythmic drugs: amiodarone  Previous cardioversions: none Previous ablations: 2011  flutter ablation  CHADS2VASC score: 3 Anticoagulation history: Eliquis   Past Medical History:  Diagnosis Date   Age-related nuclear cataract, bilateral 06/29/2020   Allergic rhinitis    BPH (benign prostatic hyperplasia)    Chicken pox    Coronary artery disease    a. MI 2003 with CABG.   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    Osteoarthritis of knee 05/13/2017   Renal cell carcinoma ([place]) 02/04/2019   Partial nephrectomyKIDNEY, LEFT, PARTIAL NEPHRECTOMY:    Right ureteral stone    S/P ablation of atrial flutter    followed by dr Rayann Heman (EP cardiolgoist)  ablation 04-17-2004  and by dr allred 02-10- 2011 was successful   S/P CABG x 5 08-05-2001   in [place] , [place]   LIMA to LAD,  seqVG to Diagonal,  SeqVG to OM,  seqVG to PDA and PLA   Seasonal allergies    Sinus bradycardia    Wears glasses    Past Surgical History:  Procedure Laterality Date   CARDIAC CATHETERIZATION  08-04-2001  in [place], [place]   severe 3 vessel cad   CARDIAC ELECTROPHYSIOLOGY [place]  x2  04-17-2004 [place], [place]);  04-14-2009 by dr allred   per dr allred report-- successful ablation clockwise isthmus-dependent right atrial flutter along the usual cavotricupid isthmus;  complete bidirectionl isthmus block achieved   CORONARY ARTERY BYPASS GRAFT  08-06-2003  in [place], [place]   x5-- LIMA - LAD,  seqVG to Diagonal, OM, PDA, and PLA   CYSTOSCOPY WITH RETROGRADE PYELOGRAM, URETEROSCOPY AND STENT PLACEMENT Right 08/10/2016   Procedure: CYSTOSCOPY WITH RETROGRADE PYELOGRAM, URETEROSCOPY AND STENT PLACEMENT;  Surgeon: Alexis Frock, MD;  Location: [facility];  Service: Urology;   Laterality: Right;   FIXATION RIGHT WRIST Right 2011   stability for arthritis   INGUINAL HERNIA REPAIR Right 08-08-2015   [facility]- health care at [place] Left 02/04/2019   Procedure: XI ROBOTIC ASSITED LEFT PARTIAL NEPHRECTOMY; LEFT PARTIAL ADRENALECTOMY;  Surgeon: Alexis Frock, MD;  Location: [facility];  Service: Urology;  Laterality: Left;  3 HRS   ROTATOR CUFF REPAIR Right 2004;  2007   TOTAL KNEE ARTHROPLASTY Left 11/30/2020   Procedure: TOTAL KNEE ARTHROPLASTY;  Surgeon: Susa Day, MD;  Location: [facility];  Service: Orthopedics;  Laterality: Left;   WISDOM TOOTH EXTRACTION      Current Outpatient Medications  Medication Sig Dispense Refill   amiodarone (PACERONE) 200 MG tablet Take 1 tablet (200 mg total) by mouth daily. 30 tablet 5   apixaban (ELIQUIS) 5 MG TABS tablet Take 1 tablet by mouth twice daily 60 tablet 5   atorvastatin (LIPITOR) 40 MG tablet Take 40 mg by mouth daily.     midodrine (PROAMATINE) 5 MG tablet TAKE 1 TABLET BY MOUTH THREE TIMES DAILY WITH MEALS 90 tablet 7   Multiple Vitamin (MULTIVITAMIN ADULT PO) Take 1 tablet by mouth daily.     Red Yeast Rice Extract (RED YEAST RICE PO) Take by mouth.     Zinc 50 MG TABS Take 1 tablet by mouth daily at 12 noon.     No current facility-administered medications for this encounter.    No Known Allergies  Social History   Socioeconomic History   Marital status: Married    Spouse name: Not on file   Number of children: Not on file   Years of education: Not on file   Highest education level: Not on file  Occupational History   Occupation: retired  Tobacco Use   Smoking status: Never    Passive exposure: Past   Smokeless tobacco: Current    Types: Snuff   Tobacco comments:    03/16/2021 ~ dip tobacco for 30 yrs  Vaping Use   Vaping Use: Never used  Substance and Sexual Activity   Alcohol use: Not Currently    Comment: occ.    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; Geneticist, molecular Situation: lives with wife      Spiritual Beliefs: Christian      Lifestyle: tries to make it to the gym 5 days per week; healthy diet     -smoke alarm in the home:Yes     - wears seatbelt: Yes     - Feels safe in their relationships: Yes         Social Determinants of Health   Financial Resource Strain: Low Risk  (06/07/2021)   Overall Financial Resource  Strain (CARDIA)    Difficulty of Paying Living Expenses: Not hard at all  Food Insecurity: No Food Insecurity (06/07/2021)   Hunger Vital Sign    Worried About Running Out of Food in the Last Year: Never true    Ran Out of Food in the Last Year: Never true  Transportation Needs: No Transportation Needs (06/07/2021)   PRAPARE - Hydrologist (Medical): No    Lack of Transportation (Non-Medical): No  Physical Activity: Insufficiently Active (06/07/2021)   Exercise Vital Sign    Days of Exercise per Week: 2 days    Minutes of Exercise per Session: 40 min  Stress: No Stress Concern Present (06/07/2021)   Victoria Vera    Feeling of Stress : Not at all  Social Connections: Moderately Isolated (06/07/2021)   Social Connection and Isolation Panel [NHANES]    Frequency of Communication with Friends and Family: Three times a week    Frequency of Social Gatherings with Friends and Family: More than three times a week    Attends Religious Services: Never    Marine scientist or Organizations: No    Attends Archivist Meetings: Never    Marital Status: Married  Human resources officer Violence: Not At Risk (06/07/2021)   Humiliation, Afraid, Rape, and Kick questionnaire    Fear of Current or Ex-Partner: No    Emotionally Abused: No    Physically Abused: No    Sexually  Abused: No     ROS- All systems are reviewed and negative except as per the HPI above.  Physical Exam: Vitals:   05/14/22 1022  BP: 130/70  Pulse: (!) 57  Weight: 73.1 kg  Height: '5\' 5"'$  (1.651 m)     GEN- The patient is a well appearing elderly male, alert and oriented x 3 today.   HEENT-head normocephalic, atraumatic, sclera clear, conjunctiva pink, hearing intact, trachea midline. Lungs- Clear to ausculation bilaterally, normal work of breathing Heart- Regular rate and rhythm, no murmurs, rubs or gallops  GI- soft, NT, ND, + BS Extremities- no clubbing, cyanosis, or edema MS- no significant deformity or atrophy Skin- no rash or lesion Psych- euthymic mood, full affect Neuro- strength and sensation are intact   Wt Readings from Last 3 Encounters:  05/14/22 73.1 kg  10/27/21 70.3 kg  06/26/21 69.9 kg    EKG today demonstrates  SB, RBBB Vent. rate 57 BPM PR interval 178 ms QRS duration 120 ms QT/QTcB 448/436 ms  Echo 12/02/20 demonstrated   1. Left ventricular ejection fraction, by estimation, is 70 to 75%. The  left ventricle has hyperdynamic function. The left ventricle has no  regional wall motion abnormalities. Left ventricular diastolic function  could not be evaluated.   2. Right ventricular systolic function is normal. The right ventricular  size is normal.   3. The mitral valve is grossly normal. No evidence of mitral valve  regurgitation.   4. The aortic valve is tricuspid. Aortic valve regurgitation is not  visualized. No aortic stenosis is present.   5. The inferior vena cava is normal in size with <50% respiratory  variability, suggesting right atrial pressure of 8 mmHg.   Comparison(s): Changes from prior study are noted. 01/01/2020: LVEF 55-60%, normal atrial size.   Epic records are reviewed at length today  CHA2DS2-VASc Score = 3  The patient's score is based upon: CHF History: 0 HTN History: 0 Diabetes History: 0 Stroke History:  0 Vascular Disease History: 1 Age Score: 2 Gender Score: 0        ASSESSMENT AND PLAN: 1. Paroxysmal Atrial Fibrillation/atrial flutter The patient's CHA2DS2-VASc score is 3, indicating a 3.2% annual risk of stroke.   S/p flutter ablation 2011 Patient appears to be maintaining SR.  Continue amiodarone 200 mg daily Check cmet/TSH/CXR today for amiodarone monitoring.   Continue Eliquis 5 mg BID Not on AV nodal agent given h/o bradycardia and hypotension on midodrine.   2. Secondary Hypercoagulable State (ICD10:  D68.69) The patient is at significant risk for stroke/thromboembolism based upon his CHA2DS2-VASc Score of 3.  Continue Apixaban (Eliquis).   3. CAD S/p CABG 2001 No anginal symptoms.   Follow up with Dr Irish Lack per recall. AF clinic in one year.    [facility] [address] [place],  [address] [phone] 05/14/2022 10:39 AM

## 2022-06-27 ENCOUNTER — Telehealth: Payer: Self-pay | Admitting: Family Medicine

## 2022-06-27 NOTE — Telephone Encounter (Signed)
Contacted Donald [name] to schedule their annual wellness visit. Appointment made for 07/11/2022.  [name] [facility] AWV TEAM Direct Dial [phone]

## 2022-07-11 ENCOUNTER — Ambulatory Visit (INDEPENDENT_AMBULATORY_CARE_PROVIDER_SITE_OTHER): Payer: HMO

## 2022-07-11 VITALS — Wt 161.0 lb

## 2022-07-11 DIAGNOSIS — Z0000 Encounter for general adult medical examination without abnormal findings: Secondary | ICD-10-CM | POA: Diagnosis not present

## 2022-07-11 NOTE — Progress Notes (Signed)
I connected with  Verdon Cummins on 07/11/22 by a audio enabled telemedicine application and verified that I am speaking with the correct person using two identifiers.  Patient Location: Home  Provider Location: Home Office  I discussed the limitations of evaluation and management by telemedicine. The patient expressed understanding and agreed to proceed.   Subjective:   Donald [name] is a 77 y.o. male who presents for Medicare Annual/Subsequent preventive examination.  Review of Systems     Cardiac Risk Factors include: advanced age (>39men, >81 women);male gender;dyslipidemia     Objective:    Today's Vitals   07/11/22 1622  Weight: 161 lb (73 kg)   Body mass index is 26.79 kg/m.     07/11/2022    4:27 PM 06/07/2021    3:12 PM 12/03/2020    5:00 AM 11/30/2020    5:55 AM 11/22/2020   11:02 AM 06/01/2020    2:18 PM 02/04/2019    6:59 AM  Advanced Directives  Does Patient Have a Medical Advance Directive? Yes Yes Yes Yes Yes Yes Yes  Type of Estate agent of [place];Living will Healthcare Power of [employer] of [place];Living will Healthcare Power of [place];Living will Healthcare Power of [place];Living will Healthcare Power of [place];Living will Healthcare Power of [place];Living will  Does patient want to make changes to medical advance directive? No - Patient declined  No - Patient declined No - Patient declined   No - Patient declined  Copy of Healthcare Power of Attorney in Chart? Yes - validated most recent copy scanned in chart (See row information) Yes - validated most recent copy scanned in chart (See row information) No - copy requested No - copy requested Yes - validated most recent copy scanned in chart (See row information) Yes - validated most recent copy scanned in chart (See row information) Yes - validated most recent copy scanned in chart (See row information)    Current Medications (verified) Outpatient Encounter  Medications as of 07/11/2022  Medication Sig   amiodarone (PACERONE) 200 MG tablet Take 1 tablet (200 mg total) by mouth daily.   apixaban (ELIQUIS) 5 MG TABS tablet Take 1 tablet by mouth twice daily   atorvastatin (LIPITOR) 40 MG tablet Take 40 mg by mouth daily.   midodrine (PROAMATINE) 5 MG tablet TAKE 1 TABLET BY MOUTH THREE TIMES DAILY WITH MEALS   Multiple Vitamin (MULTIVITAMIN ADULT PO) Take 1 tablet by mouth daily.   Zinc 50 MG TABS Take 1 tablet by mouth daily at 12 noon.   [DISCONTINUED] Red Yeast Rice Extract (RED YEAST RICE PO) Take by mouth.   No facility-administered encounter medications on file as of 07/11/2022.    Allergies (verified) Patient has no known allergies.   History: Past Medical History:  Diagnosis Date   Age-related nuclear cataract, bilateral 06/29/2020   Allergic rhinitis    BPH (benign prostatic hyperplasia)    Chicken pox    Coronary artery disease    a. MI 2003 with CABG.   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    Osteoarthritis of knee 05/13/2017  Renal cell carcinoma (HCC) 02/04/2019   Partial nephrectomyKIDNEY, LEFT, PARTIAL NEPHRECTOMY:    Right ureteral stone    S/P ablation of atrial flutter    followed by dr Johney Frame (EP cardiolgoist)  ablation 04-17-2004  and by dr allred 02-10- 2011 was successful   S/P CABG x 5 08-05-2001   in [place] , [place]   LIMA to LAD,  seqVG to Diagonal,  SeqVG to OM,  seqVG to PDA and PLA   Seasonal allergies    Sinus bradycardia    Wears glasses    Past Surgical History:  Procedure Laterality Date   CARDIAC CATHETERIZATION  08-04-2001  in [place], [place]   severe 3 vessel cad   CARDIAC ELECTROPHYSIOLOGY  MAPPING AND ABLATION  x2  04-17-2004 [place], [place]);  04-14-2009 by dr Johney Frame   per dr allred report-- successful ablation clockwise isthmus-dependent right atrial flutter along the usual cavotricupid isthmus;  complete bidirectionl isthmus block achieved   CORONARY ARTERY BYPASS GRAFT  08-06-2003  in [place], [place]   x5-- LIMA - LAD,  seqVG to Diagonal, OM, PDA, and PLA   CYSTOSCOPY WITH RETROGRADE PYELOGRAM, URETEROSCOPY AND STENT PLACEMENT Right 08/10/2016   Procedure: CYSTOSCOPY WITH RETROGRADE PYELOGRAM, URETEROSCOPY AND STENT PLACEMENT;  Surgeon: Sebastian Ache, MD;  Location: [facility];  Service: Urology;  Laterality: Right;   FIXATION RIGHT WRIST Right 2011   stability for arthritis   INGUINAL HERNIA REPAIR Right 08-08-2015   [facility]   ROBOTIC ASSITED PARTIAL NEPHRECTOMY Left 02/04/2019   Procedure: XI ROBOTIC ASSITED LEFT PARTIAL NEPHRECTOMY; LEFT PARTIAL ADRENALECTOMY;  Surgeon: Sebastian Ache, MD;  Location: [facility];  Service: Urology;  Laterality: Left;  3 HRS   ROTATOR CUFF REPAIR Right 2004;  2007   TOTAL KNEE ARTHROPLASTY Left 11/30/2020   Procedure: TOTAL KNEE ARTHROPLASTY;  Surgeon: Jene Every, MD;  Location: [facility];  Service: Orthopedics;  Laterality: Left;   WISDOM TOOTH EXTRACTION     Family History  Problem Relation Age of Onset   Heart attack Mother    Alzheimer's disease Mother    Cancer Father        throat cancer, chewing tobacco   Hypertension Neg Hx    Social History   Socioeconomic History   Marital status: Married    Spouse name: Not on file   Number of children: Not on file   Years of education: Not on file   Highest education level: Not on file  Occupational History   Occupation: retired  Tobacco Use   Smoking status: Never    Passive exposure: Past   Smokeless tobacco: Current    Types: Snuff   Tobacco comments:    03/16/2021 ~ dip tobacco for 30 yrs  Vaping Use   Vaping Use: Never used  Substance  and Sexual Activity   Alcohol use: Not Currently    Comment: occ.    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; Audiological scientist Situation: lives with wife      Spiritual Beliefs: Christian      Lifestyle: tries to make it to the gym 5 days per week; healthy diet     -smoke alarm in the home:Yes     - wears seatbelt: Yes     -  Feels safe in their relationships: Yes         Social Determinants of Health   Financial Resource Strain: Low Risk  (07/11/2022)   Overall Financial Resource Strain (CARDIA)    Difficulty of Paying Living Expenses: Not hard at all  Food Insecurity: No Food Insecurity (07/11/2022)   Hunger Vital Sign    Worried About Running Out of Food in the Last Year: Never true    Ran Out of Food in the Last Year: Never true  Transportation Needs: No Transportation Needs (07/11/2022)   PRAPARE - Administrator, Civil Service (Medical): No    Lack of Transportation (Non-Medical): No  Physical Activity: Sufficiently Active (07/11/2022)   Exercise Vital Sign    Days of Exercise per Week: 5 days    Minutes of Exercise per Session: 60 min  Stress: No Stress Concern Present (07/11/2022)   [employer] of Occupational Health - Occupational Stress Questionnaire    Feeling of Stress : Not at all  Social Connections: Moderately Isolated (07/11/2022)   Social Connection and Isolation Panel [NHANES]    Frequency of Communication with Friends and Family: More than three times a week    Frequency of Social Gatherings with Friends and Family: More than three times a week    Attends Religious Services: Never    Database administrator or Organizations: No    Attends Banker Meetings: Never    Marital Status: Married    Tobacco Counseling Ready to quit: Not Answered Counseling given: Not Answered Tobacco comments:  03/16/2021 ~ dip tobacco for 30 yrs   Clinical Intake:  Pre-visit preparation completed: Yes  Pain : No/denies pain     BMI - recorded: 26.79 Nutritional Status: BMI 25 -29 Overweight Nutritional Risks: None Diabetes: No  How often do you need to have someone help you when you read instructions, pamphlets, or other written materials from your doctor or pharmacy?: 1 - Never  Diabetic?no  Interpreter Needed?: No  Information entered by :: Lanier Ensign, LPN   Activities of Daily Living    07/11/2022    4:28 PM  In your present state of health, do you have any difficulty performing the following activities:  Hearing? 0  Vision? 0  Difficulty concentrating or making decisions? 0  Walking or climbing stairs? 0  Dressing or bathing? 0  Doing errands, shopping? 0  Preparing Food and eating ? N  Using the Toilet? N  In the past six months, have you accidently leaked urine? N  Do you have problems with loss of bowel control? N  Managing your Medications? N  Managing your Finances? N  Housekeeping or managing your Housekeeping? N    Patient Care Team: Natalia Leatherwood, DO as PCP - General (Family Medicine) Corky Crafts, MD as PCP - Cardiology (Cardiology) Hillis Range, MD (Inactive) as PCP - Electrophysiology (Cardiology) Annice Pih., MD as Consulting Physician (Urology) Corky Crafts, MD as Consulting Physician (Cardiology) Berneice Heinrich Delbert Phenix., MD as Consulting Physician (Urology) Donzetta Starch, MD as Consulting Physician (Dermatology)  Indicate any recent Medical Services you may have received from other than [facility] providers in the past year (date may be approximate).     Assessment:   This is a routine wellness examination for Sanjith.  Hearing/Vision screen Hearing Screening - Comments:: Pt denies any hearing issues  V[facility] Screening - Comments:: [facility]   Dietary issues and exercise activities  discussed: Current Exercise Habits: Home exercise routine, Type of exercise: walking;Other - see comments, Time (Minutes): 60, Frequency (Times/Week): 5, Weekly Exercise (Minutes/Week): 300   Goals Addressed             This Visit's Progress    Patient Stated       None at this time       Depression Screen    07/11/2022    4:26 PM 06/07/2021    3:11 PM 01/20/2021   10:11 AM 06/01/2020    2:20 PM 01/12/2019    1:33 PM 09/09/2014    8:24 AM  PHQ 2/9 Scores  PHQ - 2 Score 0 0 0 0 0 0    Fall Risk    07/11/2022    4:27 PM 06/07/2021    3:13 PM 01/20/2021   10:11 AM 06/01/2020    2:20 PM 10/02/2018   11:24 AM  Fall Risk   Falls in the past year? 0 0 0 0 0  Comment     Emmi Telephone Survey: data to providers prior to load  Number falls in past yr: 0 0 0 0   Injury with Fall? 0 0 0 0   Risk for fall due to : Impaired vision Impaired vision     Follow up Falls prevention discussed Falls prevention discussed Falls evaluation completed Falls prevention discussed     FALL RISK PREVENTION PERTAINING TO THE HOME:  Any stairs in or around the home? Yes  If so, are there any without handrails? No  Home free of loose throw rugs in walkways, pet beds, electrical cords, etc? Yes  Adequate lighting in your home to reduce risk of falls? Yes   ASSISTIVE DEVICES UTILIZED TO PREVENT FALLS:  Life alert? No  Use of a cane, walker or w/c? No  Grab bars in the bathroom? Yes  Shower chair or bench in shower? Yes  Elevated toilet seat or a handicapped toilet? Yes   TIMED UP AND GO:  Was the test performed? No .   Cognitive Function:declined         06/07/2021    3:14 PM 06/01/2020    2:25 PM  6CIT Screen  What Year? 0 points 0 points  What month? 0 points 0 points  What time? 0 points 0 points  Count back from 20 0 points 0 points  Months in reverse 0 points 0 points  Repeat phrase 6 points 0 points  Total Score 6 points 0 points    Immunizations Immunization History   Administered Date(s) Administered   Fluad Quad(high Dose 65+) 12/19/2018, 12/14/2019, 01/20/2021, 12/29/2021   Influenza Split 01/03/2013, 12/03/2013, 01/04/2016, 01/03/2017   Influenza, High Dose Seasonal PF 12/27/2016   Influenza,inj,Quad PF,6+ Mos 01/12/2014   Influenza,inj,quad, With Preservative 12/03/2016, 12/17/2017   Influenza-Unspecified 01/03/2009, 01/04/2011, 01/04/2012, 12/19/2014, 12/19/2018, 12/04/2019, 01/20/2021   PFIZER(Purple Top)SARS-COV-2 Vaccination 04/30/2019, 05/20/2019, 01/11/2020   Pneumococcal Conjugate-13 09/24/2013, 11/03/2013   Pneumococcal Polysaccharide-23 12/03/2016   Pneumococcal-Unspecified 12/04/2007, 12/03/2016   Td 06/11/2016   Td (Adult) 06/11/2016   Tdap 03/05/2006   Zoster Recombinat (Shingrix) 11/12/2019, 02/11/2020    TDAP status: Up to date  Flu Vaccine status: Up to date  Pneumococcal vaccine status: Up to date  Covid-19 vaccine status: Completed vaccines  Qualifies for Shingles Vaccine? Yes   Zostavax completed Yes   Shingrix Completed?: Yes  Screening Tests Health Maintenance  Topic Date Due   Hepatitis C Screening  Never done   COVID-19 Vaccine (4 - 2023-24 season)  11/03/2021   INFLUENZA VACCINE  10/04/2022   Medicare Annual Wellness (AWV)  07/11/2023   DTaP/Tdap/Td (4 - Td or Tdap) 06/12/2026   Pneumonia Vaccine 59+ Years old  Completed   Zoster Vaccines- Shingrix  Completed   HPV VACCINES  Aged Out   Fecal DNA (Cologuard)  Discontinued    Health Maintenance  Health Maintenance Due  Topic Date Due   Hepatitis C Screening  Never done   COVID-19 Vaccine (4 - 2023-24 season) 11/03/2021    Colorectal cancer screening: No longer required.    Additional Screening:  Hepatitis C Screening: does qualify;   Vision Screening: Recommended annual ophthalmology exams for early detection of glaucoma and other disorders of the eye. Is the patient up to date with their annual eye exam?  Yes  Who is the provider or what is  the name of the office in which the patient attends [facility] If pt is not established with a provider, would they like to be referred to a provider to establish care? No .   Dental Screening: Recommended annual dental exams for proper oral hygiene  Community Resource Referral / Chronic Care Management: CRR required this visit?  No   CCM required this visit?  No      Plan:     I have personally reviewed and noted the following in the patient's chart:   Medical and social history Use of alcohol, tobacco or illicit drugs  Current medications and supplements including opioid prescriptions. Patient is not currently taking opioid prescriptions. Functional ability and status Nutritional status Physical activity Advanced directives List of other physicians Hospitalizations, surgeries, and ER visits in previous 12 months Vitals Screenings to include cognitive, depression, and falls Referrals and appointments  In addition, I have reviewed and discussed with patient certain preventive protocols, quality metrics, and best practice recommendations. A written personalized care plan for preventive services as well as general preventive health recommendations were provided to patient.     Marzella Schlein, LPN   0/11/8117   Nurse Notes: Pt declined cognition testing stated he just completed a neuro exam

## 2022-07-11 NOTE — Patient Instructions (Signed)
[name] , Thank you for taking time to come for your Medicare Wellness Visit. I appreciate your ongoing commitment to your health goals. Please review the following plan we discussed and let me know if I can assist you in the future.   These are the goals we discussed:  Goals      Patient Stated     Drink more water & stay active     Patient Stated     Patient Stated     None at this time      Patient Stated     None at this time        This is a list of the screening recommended for you and due dates:  Health Maintenance  Topic Date Due   Hepatitis C Screening: USPSTF Recommendation to screen - Ages 28-79 yo.  Never done   COVID-19 Vaccine (4 - 2023-24 season) 11/03/2021   Flu Shot  10/04/2022   Medicare Annual Wellness Visit  07/11/2023   DTaP/Tdap/Td vaccine (4 - Td or Tdap) 06/12/2026   Pneumonia Vaccine  Completed   Zoster (Shingles) Vaccine  Completed   HPV Vaccine  Aged Out   Cologuard (Stool DNA test)  Discontinued    Advanced directives: copies in chart   Conditions/risks identified: none at this time   Next appointment: Follow up in one year for your annual wellness visit.   Preventive Care 56 Years and Older, Male  Preventive care refers to lifestyle choices and visits with your health care provider that can promote health and wellness. What does preventive care include? A yearly physical exam. This is also called an annual well check. Dental exams once or twice a year. Routine eye exams. Ask your health care provider how often you should have your eyes checked. Personal lifestyle choices, including: Daily care of your teeth and gums. Regular physical activity. Eating a healthy diet. Avoiding tobacco and drug use. Limiting alcohol use. Practicing safe sex. Taking low doses of aspirin every day. Taking vitamin and mineral supplements as recommended by your health care provider. What happens during an annual well check? The services and screenings  done by your health care provider during your annual well check will depend on your age, overall health, lifestyle risk factors, and family history of disease. Counseling  Your health care provider may ask you questions about your: Alcohol use. Tobacco use. Drug use. Emotional well-being. Home and relationship well-being. Sexual activity. Eating habits. History of falls. Memory and ability to understand (cognition). Work and work Astronomer. Screening  You may have the following tests or measurements: Height, weight, and BMI. Blood pressure. Lipid and cholesterol levels. These may be checked every 5 years, or more frequently if you are over 51 years old. Skin check. Lung cancer screening. You may have this screening every year starting at age 19 if you have a 30-pack-year history of smoking and currently smoke or have quit within the past 15 years. Fecal occult blood test (FOBT) of the stool. You may have this test every year starting at age 63. Flexible sigmoidoscopy or colonoscopy. You may have a sigmoidoscopy every 5 years or a colonoscopy every 10 years starting at age 72. Prostate cancer screening. Recommendations will vary depending on your family history and other risks. Hepatitis C blood test. Hepatitis B blood test. Sexually transmitted disease (STD) testing. Diabetes screening. This is done by checking your blood sugar (glucose) after you have not eaten for a while (fasting). You may have this done  every 1-3 years. Abdominal aortic aneurysm (AAA) screening. You may need this if you are a current or former smoker. Osteoporosis. You may be screened starting at age 32 if you are at high risk. Talk with your health care provider about your test results, treatment options, and if necessary, the need for more tests. Vaccines  Your health care provider may recommend certain vaccines, such as: Influenza vaccine. This is recommended every year. Tetanus, diphtheria, and acellular  pertussis (Tdap, Td) vaccine. You may need a Td booster every 10 years. Zoster vaccine. You may need this after age 4. Pneumococcal 13-valent conjugate (PCV13) vaccine. One dose is recommended after age 59. Pneumococcal polysaccharide (PPSV23) vaccine. One dose is recommended after age 61. Talk to your health care provider about which screenings and vaccines you need and how often you need them. This information is not intended to replace advice given to you by your health care provider. Make sure you discuss any questions you have with your health care provider. Document Released: 03/18/2015 Document Revised: 11/09/2015 Document Reviewed: 12/21/2014 Elsevier Interactive Patient Education  2017 [employer].  Fall Prevention in the Home Falls can cause injuries. They can happen to people of all ages. There are many things you can do to make your home safe and to help prevent falls. What can I do on the outside of my home? Regularly fix the edges of walkways and driveways and fix any cracks. Remove anything that might make you trip as you walk through a door, such as a raised step or threshold. Trim any bushes or trees on the path to your home. Use bright outdoor lighting. Clear any walking paths of anything that might make someone trip, such as rocks or tools. Regularly check to see if handrails are loose or broken. Make sure that both sides of any steps have handrails. Any raised decks and porches should have guardrails on the edges. Have any leaves, snow, or ice cleared regularly. Use sand or salt on walking paths during winter. Clean up any spills in your garage right away. This includes oil or grease spills. What can I do in the bathroom? Use night lights. Install grab bars by the toilet and in the tub and shower. Do not use towel bars as grab bars. Use non-skid mats or decals in the tub or shower. If you need to sit down in the shower, use a plastic, non-slip stool. Keep the floor  dry. Clean up any water that spills on the floor as soon as it happens. Remove soap buildup in the tub or shower regularly. Attach bath mats securely with double-sided non-slip rug tape. Do not have throw rugs and other things on the floor that can make you trip. What can I do in the bedroom? Use night lights. Make sure that you have a light by your bed that is easy to reach. Do not use any sheets or blankets that are too big for your bed. They should not hang down onto the floor. Have a firm chair that has side arms. You can use this for support while you get dressed. Do not have throw rugs and other things on the floor that can make you trip. What can I do in the kitchen? Clean up any spills right away. Avoid walking on wet floors. Keep items that you use a lot in easy-to-reach places. If you need to reach something above you, use a strong step stool that has a grab bar. Keep electrical cords out of the  way. Do not use floor polish or wax that makes floors slippery. If you must use wax, use non-skid floor wax. Do not have throw rugs and other things on the floor that can make you trip. What can I do with my stairs? Do not leave any items on the stairs. Make sure that there are handrails on both sides of the stairs and use them. Fix handrails that are broken or loose. Make sure that handrails are as long as the stairways. Check any carpeting to make sure that it is firmly attached to the stairs. Fix any carpet that is loose or worn. Avoid having throw rugs at the top or bottom of the stairs. If you do have throw rugs, attach them to the floor with carpet tape. Make sure that you have a light switch at the top of the stairs and the bottom of the stairs. If you do not have them, ask someone to add them for you. What else can I do to help prevent falls? Wear shoes that: Do not have high heels. Have rubber bottoms. Are comfortable and fit you well. Are closed at the toe. Do not wear  sandals. If you use a stepladder: Make sure that it is fully opened. Do not climb a closed stepladder. Make sure that both sides of the stepladder are locked into place. Ask someone to hold it for you, if possible. Clearly mark and make sure that you can see: Any grab bars or handrails. First and last steps. Where the edge of each step is. Use tools that help you move around (mobility aids) if they are needed. These include: Canes. Walkers. Scooters. Crutches. Turn on the lights when you go into a dark area. Replace any light bulbs as soon as they burn out. Set up your furniture so you have a clear path. Avoid moving your furniture around. If any of your floors are uneven, fix them. If there are any pets around you, be aware of where they are. Review your medicines with your doctor. Some medicines can make you feel dizzy. This can increase your chance of falling. Ask your doctor what other things that you can do to help prevent falls. This information is not intended to replace advice given to you by your health care provider. Make sure you discuss any questions you have with your health care provider. Document Released: 12/16/2008 Document Revised: 07/28/2015 Document Reviewed: 03/26/2014 Elsevier Interactive Patient Education  2017 [employer].

## 2022-10-11 ENCOUNTER — Other Ambulatory Visit (HOSPITAL_COMMUNITY): Payer: Self-pay | Admitting: Urology

## 2022-10-11 ENCOUNTER — Ambulatory Visit (HOSPITAL_COMMUNITY)
Admission: RE | Admit: 2022-10-11 | Discharge: 2022-10-11 | Disposition: A | Discharge status: Home / Self Care | Payer: HMO | Source: Ambulatory Visit | Attending: Urology | Admitting: Urology

## 2022-10-11 DIAGNOSIS — C642 Malignant neoplasm of left kidney, except renal pelvis: Secondary | ICD-10-CM | POA: Diagnosis not present

## 2022-10-11 DIAGNOSIS — R918 Other nonspecific abnormal finding of lung field: Secondary | ICD-10-CM | POA: Diagnosis not present

## 2022-10-18 DIAGNOSIS — N202 Calculus of kidney with calculus of ureter: Secondary | ICD-10-CM | POA: Diagnosis not present

## 2022-10-18 DIAGNOSIS — C642 Malignant neoplasm of left kidney, except renal pelvis: Secondary | ICD-10-CM | POA: Diagnosis not present

## 2022-10-29 DIAGNOSIS — R3915 Urgency of urination: Secondary | ICD-10-CM | POA: Diagnosis not present

## 2022-10-29 DIAGNOSIS — N202 Calculus of kidney with calculus of ureter: Secondary | ICD-10-CM | POA: Diagnosis not present

## 2022-10-29 DIAGNOSIS — C642 Malignant neoplasm of left kidney, except renal pelvis: Secondary | ICD-10-CM | POA: Diagnosis not present

## 2022-11-08 DIAGNOSIS — Z85828 Personal history of other malignant neoplasm of skin: Secondary | ICD-10-CM | POA: Diagnosis not present

## 2022-11-08 DIAGNOSIS — L821 Other seborrheic keratosis: Secondary | ICD-10-CM | POA: Diagnosis not present

## 2022-11-08 DIAGNOSIS — L820 Inflamed seborrheic keratosis: Secondary | ICD-10-CM | POA: Diagnosis not present

## 2022-11-08 DIAGNOSIS — D485 Neoplasm of uncertain behavior of skin: Secondary | ICD-10-CM | POA: Diagnosis not present

## 2022-11-08 DIAGNOSIS — L812 Freckles: Secondary | ICD-10-CM | POA: Diagnosis not present

## 2022-11-08 DIAGNOSIS — L570 Actinic keratosis: Secondary | ICD-10-CM | POA: Diagnosis not present

## 2022-12-06 ENCOUNTER — Encounter (HOSPITAL_COMMUNITY): Payer: Self-pay | Admitting: Physician Assistant

## 2022-12-06 ENCOUNTER — Ambulatory Visit (HOSPITAL_COMMUNITY)
Admission: RE | Admit: 2022-12-06 | Discharge: 2022-12-06 | Disposition: A | Discharge status: Home / Self Care | Payer: HMO | Source: Ambulatory Visit | Attending: Physician Assistant | Admitting: Physician Assistant

## 2022-12-06 ENCOUNTER — Inpatient Hospital Stay (HOSPITAL_COMMUNITY)
Admission: RE | Admit: 2022-12-06 | Discharge: 2022-12-06 | Disposition: A | Discharge status: Home / Self Care | Payer: HMO | Source: Ambulatory Visit | Attending: Physician Assistant | Admitting: Physician Assistant

## 2022-12-06 VITALS — BP 110/70 | HR 55 | Ht 65.0 in | Wt 158.8 lb

## 2022-12-06 DIAGNOSIS — Z79899 Other long term (current) drug therapy: Secondary | ICD-10-CM | POA: Diagnosis not present

## 2022-12-06 DIAGNOSIS — R9431 Abnormal electrocardiogram [ECG] [EKG]: Secondary | ICD-10-CM | POA: Diagnosis not present

## 2022-12-06 DIAGNOSIS — D6869 Other thrombophilia: Secondary | ICD-10-CM | POA: Diagnosis not present

## 2022-12-06 DIAGNOSIS — I4892 Unspecified atrial flutter: Secondary | ICD-10-CM | POA: Diagnosis not present

## 2022-12-06 DIAGNOSIS — R42 Dizziness and giddiness: Secondary | ICD-10-CM | POA: Diagnosis not present

## 2022-12-06 DIAGNOSIS — I2510 Atherosclerotic heart disease of native coronary artery without angina pectoris: Secondary | ICD-10-CM | POA: Insufficient documentation

## 2022-12-06 DIAGNOSIS — I4510 Unspecified right bundle-branch block: Secondary | ICD-10-CM | POA: Insufficient documentation

## 2022-12-06 DIAGNOSIS — I480 Paroxysmal atrial fibrillation: Secondary | ICD-10-CM

## 2022-12-06 DIAGNOSIS — Z5181 Encounter for therapeutic drug level monitoring: Secondary | ICD-10-CM | POA: Diagnosis not present

## 2022-12-06 DIAGNOSIS — Z7901 Long term (current) use of anticoagulants: Secondary | ICD-10-CM | POA: Diagnosis not present

## 2022-12-06 LAB — COMPREHENSIVE METABOLIC PANEL
ALT: 29 U/L (ref 0–44)
AST: 29 U/L (ref 15–41)
Albumin: 3.5 g/dL (ref 3.5–5.0)
Alkaline Phosphatase: 71 U/L (ref 38–126)
Anion gap: 6 (ref 5–15)
BUN: 22 mg/dL (ref 8–23)
CO2: 26 mmol/L (ref 22–32)
Calcium: 8.9 mg/dL (ref 8.9–10.3)
Chloride: 106 mmol/L (ref 98–111)
Creatinine, Ser: 1.65 mg/dL — ABNORMAL HIGH (ref 0.61–1.24)
GFR, Estimated: 42 mL/min — ABNORMAL LOW (ref >60)
Glucose, Bld: 101 mg/dL — ABNORMAL HIGH (ref 70–99)
Potassium: 4.7 mmol/L (ref 3.5–5.1)
Sodium: 138 mmol/L (ref 135–145)
Total Bilirubin: 0.6 mg/dL (ref 0.3–1.2)
Total Protein: 6.4 g/dL — ABNORMAL LOW (ref 6.5–8.1)

## 2022-12-06 LAB — TSH: TSH: 0.831 u[IU]/mL (ref 0.350–4.500)

## 2022-12-06 NOTE — Progress Notes (Signed)
Primary Care Physician: Natalia Leatherwood, DO Primary Cardiologist: Dr Eldridge Dace  Primary Electrophysiologist: Dr Johney Frame (remotely) Referring Physician: Dr Jackelyn Poling is a 77 y.o. male with a history of atrial flutter s/p ablation, atrial fibrillation CAD s/p CABG 2001, HLD, renal cancer s/p L nephrectomy who presents for follow up in the [facility].  The patient was initially diagnosed with atrial fibrillation 12/02/20 after presenting for knee replacement surgery. He developed afib postop with rapid rates. He did report symptoms of dizziness when in afib with RVR. He was started on diltiazem which did convert him to SR but he had issues with hypotension. His afib recurred and he was loaded on amiodarone. He was also started on Eliquis for a CHADS2VASC score of 3. In hindsight, patient has had episodes of periodic dizziness before his knee surgery.   On follow up today, patient reports that over the past 3-4 months he has felt more "woozy" and lightheaded with activity. He is in SR today. No bleeding issues on anticoagulation. He denies any chest pain or SOB.   Today, he denies symptoms of palpitations, chest pain, shortness of breath, orthopnea, PND, lower extremity edema, presyncope, syncope, snoring, daytime somnolence, bleeding, or neurologic sequela. The patient is tolerating medications without difficulties and is otherwise without complaint today.    Atrial Fibrillation Risk Factors:  he does not have symptoms or diagnosis of sleep apnea. he does not have a history of rheumatic fever.   Atrial Fibrillation Management history:  Previous antiarrhythmic drugs: amiodarone  Previous cardioversions: none Previous ablations: 2011 flutter ablation  Anticoagulation history: Eliquis   Past Medical History:  Diagnosis Date   Age-related nuclear cataract, bilateral 06/29/2020   Allergic rhinitis    BPH (benign prostatic hyperplasia)    Chicken pox     Coronary artery disease    a. MI 2003 with CABG.   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    Osteoarthritis of knee 05/13/2017   Renal cell carcinoma (HCC) 02/04/2019   Partial nephrectomyKIDNEY, LEFT, PARTIAL NEPHRECTOMY:    Right ureteral stone    S/P ablation of atrial flutter    followed by dr Johney Frame (EP cardiolgoist)  ablation 04-17-2004  and by dr Johney Frame 02-10- 2011 was successful   S/P CABG x 5 08-05-2001   in [place] , [place]   LIMA to LAD,  seqVG to Diagonal,  SeqVG to OM,  seqVG to PDA and PLA   Seasonal allergies    Sinus bradycardia    Wears glasses     Current Outpatient Medications  Medication Sig Dispense Refill   amiodarone (PACERONE) 200 MG tablet Take 1 tablet (200 mg total) by mouth daily. 90 tablet 2   apixaban (ELIQUIS) 5 MG TABS tablet Take 1 tablet by mouth twice daily 60 tablet 5   atorvastatin (LIPITOR) 40 MG tablet Take 40 mg by mouth daily.     midodrine (PROAMATINE) 5 MG tablet TAKE 1 TABLET BY MOUTH THREE TIMES DAILY WITH MEALS 90 tablet 7   Multiple Vitamin (MULTIVITAMIN ADULT PO) Take 1 tablet by mouth daily.  Zinc 50 MG TABS Take 1 tablet by mouth daily at 12 noon.     No current facility-administered medications for this encounter.    ROS- All systems are reviewed and negative except as per the HPI above.  Physical Exam: Vitals:   12/06/22 0947  BP: 110/70  Pulse: (!) 55  Weight: 72 kg  Height: 5\' 5"  (1.651 m)    GEN: Well nourished, well developed in no acute distress NECK: No JVD; No carotid bruits CARDIAC: Regular rate and rhythm, no murmurs, rubs, gallops RESPIRATORY:  Clear to auscultation without rales, wheezing or  rhonchi  ABDOMEN: Soft, non-tender, non-distended EXTREMITIES:  No edema; No deformity    Wt Readings from Last 3 Encounters:  12/06/22 72 kg  07/11/22 73 kg  05/14/22 73.1 kg    EKG today demonstrates  SB, inc RBBB Vent. rate 55 BPM PR interval 174 ms QRS duration 118 ms QT/QTcB 464/443 ms  Echo 12/02/20 demonstrated   1. Left ventricular ejection fraction, by estimation, is 70 to 75%. The  left ventricle has hyperdynamic function. The left ventricle has no  regional wall motion abnormalities. Left ventricular diastolic function  could not be evaluated.   2. Right ventricular systolic function is normal. The right ventricular  size is normal.   3. The mitral valve is grossly normal. No evidence of mitral valve  regurgitation.   4. The aortic valve is tricuspid. Aortic valve regurgitation is not  visualized. No aortic stenosis is present.   5. The inferior vena cava is normal in size with <50% respiratory  variability, suggesting right atrial pressure of 8 mmHg.   Comparison(s): Changes from prior study are noted. 01/01/2020: LVEF 55-60%, normal atrial size.   Epic records are reviewed at length today  CHA2DS2-VASc Score = 3  The patient's score is based upon: CHF History: 0 HTN History: 0 Diabetes History: 0 Stroke History: 0 Vascular Disease History: 1 Age Score: 2 Gender Score: 0        ASSESSMENT AND PLAN: Paroxysmal Atrial Fibrillation/atrial flutter The patient's CHA2DS2-VASc score is 3, indicating a 3.2% annual risk of stroke.   S/p flutter ablation 2011 Patient in SR today. Will have him wear a 2 week Zio monitor to evaluate arrhythmia burden.  Continue amiodarone 200 mg daily Check cmet/TSH today Continue Eliquis 5 mg BID Not on AV nodal agent given h/o bradycardia and hypotension on midodrine.   Secondary Hypercoagulable State (ICD10:  D68.69) The patient is at significant risk for stroke/thromboembolism based upon his CHA2DS2-VASc Score of 3.   Continue Apixaban (Eliquis).   CAD S/p CABG 2001 No anginal symptoms  Dizziness/lightheadedness Unclear etiology, could be paroxysms of afib vs chronic hypotension. I don't think bradycardia is the cause as mid 50's bpm has been his baseline heart rate.  Will check monitor as above.    Follow up with Ronie Spies as scheduled. Sooner in AF clinic pending monitor results.    Jorja Loa PA-C Afib Clinic [facility] [address] [place], [place] [address] [phone] 12/06/2022 10:07 AM

## 2022-12-08 ENCOUNTER — Other Ambulatory Visit: Payer: Self-pay | Admitting: Interventional Cardiology

## 2022-12-25 ENCOUNTER — Telehealth: Payer: Self-pay | Admitting: Cardiology

## 2022-12-25 DIAGNOSIS — I480 Paroxysmal atrial fibrillation: Secondary | ICD-10-CM | POA: Diagnosis not present

## 2022-12-25 NOTE — Telephone Encounter (Signed)
Notified by Meredeth Ide that patient had a 4.7 second pause on 10/15 at 6:37 AM.   Monitor was ordered by Alphonzo Severance, PA-C. Awaiting formal read from MD   Jonita Albee, PA-C 12/25/2022 8:06 PM

## 2023-01-07 ENCOUNTER — Other Ambulatory Visit: Payer: Self-pay | Admitting: Physician Assistant

## 2023-01-14 DIAGNOSIS — C44311 Basal cell carcinoma of skin of nose: Secondary | ICD-10-CM | POA: Diagnosis not present

## 2023-01-14 DIAGNOSIS — D485 Neoplasm of uncertain behavior of skin: Secondary | ICD-10-CM | POA: Diagnosis not present

## 2023-01-14 DIAGNOSIS — L570 Actinic keratosis: Secondary | ICD-10-CM | POA: Diagnosis not present

## 2023-01-14 DIAGNOSIS — Z85828 Personal history of other malignant neoplasm of skin: Secondary | ICD-10-CM | POA: Diagnosis not present

## 2023-01-14 DIAGNOSIS — C44319 Basal cell carcinoma of skin of other parts of face: Secondary | ICD-10-CM | POA: Diagnosis not present

## 2023-01-23 ENCOUNTER — Ambulatory Visit (INDEPENDENT_AMBULATORY_CARE_PROVIDER_SITE_OTHER): Payer: HMO

## 2023-01-23 DIAGNOSIS — Z23 Encounter for immunization: Secondary | ICD-10-CM

## 2023-02-07 ENCOUNTER — Other Ambulatory Visit: Payer: Self-pay | Admitting: Physician Assistant

## 2023-02-08 ENCOUNTER — Ambulatory Visit (INDEPENDENT_AMBULATORY_CARE_PROVIDER_SITE_OTHER): Payer: HMO

## 2023-02-08 DIAGNOSIS — Z23 Encounter for immunization: Secondary | ICD-10-CM | POA: Diagnosis not present

## 2023-02-13 NOTE — Progress Notes (Unsigned)
Cardiology Office Note    Date:  02/14/2023  ID:  Donald [name], DOB May 09, 1945, MRN [id number] PCP:  Natalia Leatherwood, DO  Cardiologist:  Lance Muss, MD  Electrophysiologist:  Hillis Range, MD (Inactive)   Chief Complaint: f/u CAD  History of Present Illness: .    Donald [name] is a 77 y.o. male originally from [employer], [place] with visit-pertinent history of CAD s/p CABG 2001, HLD, renal cancer s/p L nephrectomy, supected CKD 3b, PAF/atrial flutter s/p flutter ablation 2006, 2011, baseline sinus bradycardia (not on AVN blocking agent), anemia, hypotension requiring midodrine, BPH, colon polyps, kidney stones, RBBB, chronic dizziness here for follow-up.  He had remote ablations in 2006 and 2011 then developed recurrent afib in 2022 and this is followed by the afib clinic. He also has baseline sinus bradycardia. He also has had issues with orthostasis requiring midodrine. Last echo 2022 (limited) EF 70-75%, hyperdynamic function, no significant valve disease noted. At last AF clinic visit 12/2022 he reported feeling more woozy/lightheaded with activity. Monitor 12/2022 showed predominantly NSR, range 45-103bpm, average 58bpm, 1 pause 4.4 sec (6:37am), <1% PACs/PVCs - patient triggered episodes were associated with sinus rhythm, felt reassuring.  He returns for follow-up today with his wife overall doing well. His episodes of dizziness have significantly decreased since the summertime, only one episode in the last month for which he had to sit down and symptoms resolved. He denies any full syncope. No CP, SOB, palpitations, edema, hx of carpal tunnel or spinal stenosis. [facility] and [facility] will [facility]. He mentions he may be having some sort of facial excision soon and inquires about pre-procedure abx. He is unsure if he will have to hold blood thinner for this.  BP Lying 157/72 P 53 with little lightheadedness BP Sitting 147/75 P 54 no symptoms BP Standing 109/62 P 59 no  symptoms BP Standing 28m 127/65 P 59 no symptoms   Labwork independently reviewed: 12/2022 TSH wnl, K 4.7, Cr 1.65 higher than prior, TProt 6.4, AST ALT OK 2023 TSH OK, iron panel OK 2022 Hgb 8.3, pt OK  ROS: .    Please see the history of present illness.   All other systems are reviewed and otherwise negative.  Studies Reviewed: Marland Kitchen    EKG:  EKG obtained today was stable showing SB 49bpm, RBBB, no acute STT changes  CV Studies: Cardiac studies reviewed are outlined and summarized above. Otherwise please see EMR for full report.   Current Reported Medications:.    Current Meds  Medication Sig   acetaminophen (TYLENOL) 500 MG tablet Take by mouth.   amiodarone (PACERONE) 200 MG tablet Take 1 tablet (200 mg total) by mouth daily.   apixaban (ELIQUIS) 5 MG TABS tablet Take 1 tablet by mouth twice daily   atorvastatin (LIPITOR) 40 MG tablet Take 40 mg by mouth daily.   cephALEXin (KEFLEX) 500 MG capsule Take by mouth as needed. Prior to dental appointments   midodrine (PROAMATINE) 5 MG tablet TAKE 1 TABLET BY MOUTH THREE TIMES DAILY WITH MEALS . APPOINTMENT REQUIRED FOR FUTURE REFILLS   Multiple Vitamin (MULTIVITAMIN ADULT PO) Take 1 tablet by mouth daily.   Zinc 50 MG TABS Take 1 tablet by mouth daily at 12 noon.    Physical Exam:    VS:  BP (!) 147/75   Pulse (!) 54   Ht 5\' 5"  (1.651 m)   Wt 158 lb 9.6 oz (71.9 kg)   SpO2 97%   BMI  26.39 kg/m    Wt Readings from Last 3 Encounters:  02/14/23 158 lb 9.6 oz (71.9 kg)  12/06/22 158 lb 12.8 oz (72 kg)  07/11/22 161 lb (73 kg)    GEN: Well nourished, well developed in no acute distress NECK: No JVD; No carotid bruits CARDIAC: RRR, no murmurs, rubs, gallops RESPIRATORY:  Clear to auscultation without rales, wheezing or rhonchi  ABDOMEN: Soft, non-tender, non-distended EXTREMITIES:  No edema; No acute deformity   Asessement and Plan:.    1. Dizziness with h/o orthostasis - although his BP still drops when he is standing,  he does not fall below hypotensive threshold and is asymptomatic with this. He reports his symptoms are much improved compared to the summer, only 1 episode in last month compared to multiple over the summer. Therefore I would not make any changes to his midodrine at present time. The etiology of his orthostasis is unclear and he does not really have any other conditions that would predispose him to autonomic neuropathy. Will update labs to include SPEP + UPEP and arrange PYP scan to evaluate for possibility of amyloidosis given his concomitant arrhythmia/conduction abnormalities. Will also update CBC, CMET today.  2. PAF/flutter with sinus bradycardia - maintaining NSR/SB nadir HR 49bpm here c/w recent monitor findings. HR mid 50s on exam. Amiodarone is managed by Afib clinic. Continue Eliquis at present dose 5mg  BID but will arrange next f/u around his 78th birthday in 07/2023 to revisit dosing parameters. Avoid AVN blocking agents given baseline bradycardia. I will route this note to afib clinic to decide on the timing of his next f/u there.  3. CAD s/p CABG, HLD - asymptomatic. Not on ASA due to concomitant Eliquis. Continue atorvastatin. He reports lipids are followed by [facility] and they will drop off a copy of his results to our office for scanning.  4. Preop cardiovascular examination - he reports he is having an excision off his face soon but does not know the details. He inquired whether he needs to take any preop antibiotics for this. He recalled previously being told to take cephalexin before dental work. He does not have any specific cardiac conditions that warrant pre-procedure antibiotics. He later realized that it was actually his orthopedic doctor that recommended pre-procedure antibiotics. I told him that I would defer to the procedure doing the procedure and his orthopedic team if this was felt necessary. I gave him our fax number to fax a clearance to if needed. From cardiac standpoint given  clinical stability would be OK to proceed; would just need preop team to coordinate holding of Eliquis if needed for procedure.    Disposition: F/u with Dr. Clifton James in May 2025 to establish care - patient and wife requested.  Signed, Laurann Montana, PA-C

## 2023-02-14 ENCOUNTER — Other Ambulatory Visit: Payer: Self-pay

## 2023-02-14 ENCOUNTER — Encounter: Payer: Self-pay | Admitting: Physician Assistant

## 2023-02-14 ENCOUNTER — Ambulatory Visit: Payer: HMO | Attending: Physician Assistant | Admitting: Physician Assistant

## 2023-02-14 VITALS — BP 147/75 | HR 54 | Ht 65.0 in | Wt 158.6 lb

## 2023-02-14 DIAGNOSIS — I2510 Atherosclerotic heart disease of native coronary artery without angina pectoris: Secondary | ICD-10-CM

## 2023-02-14 DIAGNOSIS — Z01810 Encounter for preprocedural cardiovascular examination: Secondary | ICD-10-CM | POA: Diagnosis not present

## 2023-02-14 DIAGNOSIS — I951 Orthostatic hypotension: Secondary | ICD-10-CM | POA: Diagnosis not present

## 2023-02-14 DIAGNOSIS — I517 Cardiomegaly: Secondary | ICD-10-CM

## 2023-02-14 DIAGNOSIS — R9431 Abnormal electrocardiogram [ECG] [EKG]: Secondary | ICD-10-CM

## 2023-02-14 DIAGNOSIS — R42 Dizziness and giddiness: Secondary | ICD-10-CM

## 2023-02-14 DIAGNOSIS — I480 Paroxysmal atrial fibrillation: Secondary | ICD-10-CM

## 2023-02-14 DIAGNOSIS — I4892 Unspecified atrial flutter: Secondary | ICD-10-CM

## 2023-02-14 NOTE — Patient Instructions (Addendum)
Medication Instructions:   *If you need a refill on your cardiac medications before your next appointment, please call your pharmacy*   Lab Work: CMET, CBC, SPEP, UPEP If you have labs (blood work) drawn today and your tests are completely normal, you will receive your results only by: MyChart Message (if you have MyChart) OR A paper copy in the mail If you have any lab test that is abnormal or we need to change your treatment, we will call you to review the results.   Testing/Procedures: PYP SCAN    Follow-Up: At [facility], you and your health needs are our priority.  As part of our continuing mission to provide you with exceptional heart care, we have created designated Provider Care Teams.  These Care Teams include your primary Cardiologist (physician) and Advanced Practice Providers (APPs -  Physician Assistants and Nurse Practitioners) who all work together to provide you with the care you need, when you need it.  We recommend signing up for the patient portal called "MyChart".  Sign up information is provided on this After Visit Summary.  MyChart is used to connect with patients for Virtual Visits (Telemedicine).  Patients are able to view lab/test results, encounter notes, upcoming appointments, etc.  Non-urgent messages can be sent to your provider as well.   To learn more about what you can do with MyChart, go to [email or web address].    Your next appointment: FOLLOW UP WITH DR [facility] AROUND YOUR BIRTHDAY EARLY MAY   OUR FAX NUMBER [phone].

## 2023-03-08 ENCOUNTER — Telehealth: Payer: Self-pay | Admitting: Physician Assistant

## 2023-03-08 DIAGNOSIS — N1832 Chronic kidney disease, stage 3b: Secondary | ICD-10-CM

## 2023-03-08 DIAGNOSIS — I951 Orthostatic hypotension: Secondary | ICD-10-CM

## 2023-03-08 NOTE — Telephone Encounter (Signed)
 Patient dropped off his after visit summary of information from his last appointment at the [facility].  I am placing this document in Dayna Dunn's mail box.  Thank you.

## 2023-03-10 ENCOUNTER — Other Ambulatory Visit: Payer: Self-pay | Admitting: Physician Assistant

## 2023-03-10 NOTE — Telephone Encounter (Signed)
 Can you see if you can get this scanned in and sent to my Epic inbox? I am not back at [facility] until mid January due to schedule changes helping to cover the Cardmaster role at the hospital.  Also, note, patient was supposed to have CMET, CBC, SPEP and UPEP done at 02/14/23 OV but does not look like he had this done. Please remind him to have these performed. Would also remind him to schedule his May 2025 appt around his bday with Dr. Verlin (OK to schedule with me if no availability). Thank you!

## 2023-03-11 NOTE — Telephone Encounter (Signed)
 Scanned visit reviewed, thanks! I did not see any questions about labwork in the scanned documents but appears these labs were from 09/2022. Would still recommend updating CBC, CMET, along with SPEP and UPEP at his convenience. Thank you!

## 2023-03-11 NOTE — Telephone Encounter (Signed)
 Document received from box and taken to be scanned. Was told it will be routed to [facility].  Pt's documents included lab work (and questions about lab work) and pt has active recall with Dr Clifton James.

## 2023-03-12 NOTE — Telephone Encounter (Signed)
 Will route to triage to assist. CMET, CBC, SPEP UPEP under dx CKD 3b and orthostatic hypotension.

## 2023-03-12 NOTE — Telephone Encounter (Signed)
 Placed orders for CMP, CBC, UPEP and SPEP as future and released for draw at pt PCP office.   Called pt advised will need to call PCP to set up an appointment time to have labs drawn pt expresses understanding.

## 2023-03-12 NOTE — Telephone Encounter (Signed)
 Please review labs that his cardiology physician assistant would like to have collected here. If we are able to perform those labs here, patient can have labs completed here by lab appointment only, but the orders must remain the same and be placed under the physician assistant from cardiology that is on the original order.

## 2023-03-12 NOTE — Telephone Encounter (Signed)
 Orthostatic hypotension and CKD 3b. Thank you!

## 2023-03-12 NOTE — Addendum Note (Signed)
 Addended by: Macie Burows on: 03/12/2023 04:14 PM   Modules accepted: Orders

## 2023-03-12 NOTE — Telephone Encounter (Signed)
 Called patient to let him know of lab work that needs to be done. Patient stated they waiting for over an hour to get lab work done downstairs. Then they never got seen and was told they would get a call. The lab never called them. Patient was wondering if he could get lab work done at his PCP's office. Will send her a message and see if they can draw lab work from there, orders are already in system.

## 2023-03-14 ENCOUNTER — Other Ambulatory Visit: Payer: HMO

## 2023-03-14 DIAGNOSIS — N1832 Chronic kidney disease, stage 3b: Secondary | ICD-10-CM

## 2023-03-14 DIAGNOSIS — I951 Orthostatic hypotension: Secondary | ICD-10-CM | POA: Diagnosis not present

## 2023-03-15 ENCOUNTER — Other Ambulatory Visit: Payer: Self-pay

## 2023-03-15 DIAGNOSIS — E875 Hyperkalemia: Secondary | ICD-10-CM

## 2023-03-18 ENCOUNTER — Other Ambulatory Visit (INDEPENDENT_AMBULATORY_CARE_PROVIDER_SITE_OTHER): Payer: HMO

## 2023-03-18 ENCOUNTER — Ambulatory Visit: Payer: HMO

## 2023-03-18 ENCOUNTER — Telehealth: Payer: Self-pay | Admitting: Physician Assistant

## 2023-03-18 DIAGNOSIS — E875 Hyperkalemia: Secondary | ICD-10-CM

## 2023-03-18 LAB — POTASSIUM: Potassium: 4.3 meq/L (ref 3.5–5.1)

## 2023-03-18 NOTE — Telephone Encounter (Signed)
 error

## 2023-03-19 LAB — CBC
Hematocrit: 39 % (ref 37.5–51.0)
Hemoglobin: 12.7 g/dL — ABNORMAL LOW (ref 13.0–17.7)
MCH: 30.6 pg (ref 26.6–33.0)
MCHC: 32.6 g/dL (ref 31.5–35.7)
MCV: 94 fL (ref 79–97)
Platelets: 234 10*3/uL (ref 150–450)
RBC: 4.15 x10E6/uL (ref 4.14–5.80)
RDW: 12.5 % (ref 11.6–15.4)
WBC: 7.6 10*3/uL (ref 3.4–10.8)

## 2023-03-19 LAB — COMPREHENSIVE METABOLIC PANEL
ALT: 23 [IU]/L (ref 0–44)
AST: 22 [IU]/L (ref 0–40)
Albumin: 3.9 g/dL (ref 3.8–4.8)
Alkaline Phosphatase: 94 [IU]/L (ref 44–121)
BUN/Creatinine Ratio: 20 (ref 10–24)
BUN: 29 mg/dL — ABNORMAL HIGH (ref 8–27)
Bilirubin Total: 0.5 mg/dL (ref 0.0–1.2)
CO2: 24 mmol/L (ref 20–29)
Calcium: 9.4 mg/dL (ref 8.6–10.2)
Chloride: 107 mmol/L — ABNORMAL HIGH (ref 96–106)
Creatinine, Ser: 1.45 mg/dL — ABNORMAL HIGH (ref 0.76–1.27)
Globulin, Total: 2.4 g/dL (ref 1.5–4.5)
Glucose: 108 mg/dL — ABNORMAL HIGH (ref 70–99)
Potassium: 5.4 mmol/L — ABNORMAL HIGH (ref 3.5–5.2)
Sodium: 144 mmol/L (ref 134–144)
Total Protein: 6.3 g/dL (ref 6.0–8.5)
eGFR: 49 mL/min/{1.73_m2} — ABNORMAL LOW (ref >59)

## 2023-03-19 LAB — PROTEIN ELECTROPHORESIS, SERUM
A/G Ratio: 1.3 (ref 0.7–1.7)
Albumin ELP: 3.5 g/dL (ref 2.9–4.4)
Alpha 1: 0.2 g/dL (ref 0.0–0.4)
Alpha 2: 0.8 g/dL (ref 0.4–1.0)
Beta: 0.9 g/dL (ref 0.7–1.3)
Gamma Globulin: 0.9 g/dL (ref 0.4–1.8)
Globulin, Total: 2.8 g/dL (ref 2.2–3.9)
M-Spike, %: 0.2 g/dL — ABNORMAL HIGH

## 2023-03-20 ENCOUNTER — Encounter (HOSPITAL_COMMUNITY): Payer: Self-pay | Admitting: *Deleted

## 2023-03-20 ENCOUNTER — Encounter (HOSPITAL_COMMUNITY): Payer: Self-pay

## 2023-03-25 ENCOUNTER — Ambulatory Visit (HOSPITAL_COMMUNITY): Payer: HMO | Attending: Cardiology

## 2023-03-25 DIAGNOSIS — I517 Cardiomegaly: Secondary | ICD-10-CM | POA: Insufficient documentation

## 2023-03-25 DIAGNOSIS — R9431 Abnormal electrocardiogram [ECG] [EKG]: Secondary | ICD-10-CM | POA: Insufficient documentation

## 2023-03-25 LAB — MYOCARDIAL AMYLOID PLANAR & SPECT: H/CL Ratio: 1.25

## 2023-03-25 MED ORDER — TECHNETIUM TC 99M PYROPHOSPHATE
21.6000 | Freq: Once | INTRAVENOUS | Status: AC
  Administered 2023-03-25: 21.6 via INTRAVENOUS

## 2023-03-26 LAB — PROTEIN ELECTROPHORESIS,RANDOM URN
Albumin ELP, Urine: 100 %
Alpha-1-Globulin, U: 0 %
Alpha-2-Globulin, U: 0 %
Beta Globulin, U: 0 %
Creatinine, Urine: 223.8 mg/dL
Gamma Globulin, U: 0 %
Protein, Ur: 21.9 mg/dL

## 2023-03-27 ENCOUNTER — Telehealth: Payer: Self-pay

## 2023-03-27 DIAGNOSIS — D472 Monoclonal gammopathy: Secondary | ICD-10-CM

## 2023-03-27 DIAGNOSIS — I517 Cardiomegaly: Secondary | ICD-10-CM

## 2023-03-27 NOTE — Telephone Encounter (Signed)
-----   Message from Laurann Montana sent at 03/26/2023  1:32 PM EST ----- Please let pt know I discussed his lab results and PYP scan with Dr. Izora Ribas, one of our physicians in Dr. Hoyle Barr absence. The PYP scan was read as equivocal meaning neither yes nor no for cardiac amyloidosis. Dr. Izora Ribas thinks it is likely negative. However, given the clinical history he agrees that cardiac MRI would be appropriate (OK'd with present renal value). Please make sure that patient does not have any metal in his body that would preclude getting this study. His SPEP showed very minor finding called M Spike. Overall Dr. Izora Ribas does not think these are high risk findings but reasonable to refer to heme-onc to evaluate the significance. Reason for referral is question MGUS. Thank you!

## 2023-03-27 NOTE — Telephone Encounter (Signed)
The patient has been notified of the result and verbalized understanding.  All questions (if any) were answered. Frutoso Schatz, RN 03/27/2023 4:33 PM  CMRI has been ordered. Referral has been placed.

## 2023-04-09 ENCOUNTER — Inpatient Hospital Stay: Payer: HMO

## 2023-04-09 ENCOUNTER — Encounter: Payer: Self-pay | Admitting: Oncology

## 2023-04-09 ENCOUNTER — Inpatient Hospital Stay: Payer: HMO | Attending: Oncology | Admitting: Oncology

## 2023-04-09 VITALS — BP 131/68 | HR 59 | Temp 97.6°F | Resp 20 | Ht 65.0 in | Wt 156.7 lb

## 2023-04-09 DIAGNOSIS — F17200 Nicotine dependence, unspecified, uncomplicated: Secondary | ICD-10-CM | POA: Diagnosis not present

## 2023-04-09 DIAGNOSIS — Z85528 Personal history of other malignant neoplasm of kidney: Secondary | ICD-10-CM | POA: Insufficient documentation

## 2023-04-09 DIAGNOSIS — D649 Anemia, unspecified: Secondary | ICD-10-CM | POA: Diagnosis not present

## 2023-04-09 DIAGNOSIS — I9589 Other hypotension: Secondary | ICD-10-CM | POA: Diagnosis not present

## 2023-04-09 DIAGNOSIS — D472 Monoclonal gammopathy: Secondary | ICD-10-CM | POA: Diagnosis not present

## 2023-04-09 DIAGNOSIS — I959 Hypotension, unspecified: Secondary | ICD-10-CM | POA: Insufficient documentation

## 2023-04-09 DIAGNOSIS — N1832 Chronic kidney disease, stage 3b: Secondary | ICD-10-CM | POA: Diagnosis not present

## 2023-04-09 LAB — CBC WITH DIFFERENTIAL/PLATELET
Abs Immature Granulocytes: 0.01 10*3/uL (ref 0.00–0.07)
Basophils Absolute: 0.1 10*3/uL (ref 0.0–0.1)
Basophils Relative: 1 %
Eosinophils Absolute: 0.2 10*3/uL (ref 0.0–0.5)
Eosinophils Relative: 2 %
HCT: 38 % — ABNORMAL LOW (ref 39.0–52.0)
Hemoglobin: 12.2 g/dL — ABNORMAL LOW (ref 13.0–17.0)
Immature Granulocytes: 0 %
Lymphocytes Relative: 19 %
Lymphs Abs: 1.3 10*3/uL (ref 0.7–4.0)
MCH: 29.8 pg (ref 26.0–34.0)
MCHC: 32.1 g/dL (ref 30.0–36.0)
MCV: 92.7 fL (ref 80.0–100.0)
Monocytes Absolute: 0.6 10*3/uL (ref 0.1–1.0)
Monocytes Relative: 9 %
Neutro Abs: 4.9 10*3/uL (ref 1.7–7.7)
Neutrophils Relative %: 69 %
Platelets: 219 10*3/uL (ref 150–400)
RBC: 4.1 MIL/uL — ABNORMAL LOW (ref 4.22–5.81)
RDW: 13.8 % (ref 11.5–15.5)
WBC: 7 10*3/uL (ref 4.0–10.5)
nRBC: 0 % (ref 0.0–0.2)

## 2023-04-09 LAB — COMPREHENSIVE METABOLIC PANEL
ALT: 21 U/L (ref 0–44)
AST: 21 U/L (ref 15–41)
Albumin: 3.8 g/dL (ref 3.5–5.0)
Alkaline Phosphatase: 73 U/L (ref 38–126)
Anion gap: 5 (ref 5–15)
BUN: 27 mg/dL — ABNORMAL HIGH (ref 8–23)
CO2: 29 mmol/L (ref 22–32)
Calcium: 9.3 mg/dL (ref 8.9–10.3)
Chloride: 106 mmol/L (ref 98–111)
Creatinine, Ser: 1.42 mg/dL — ABNORMAL HIGH (ref 0.61–1.24)
GFR, Estimated: 50 mL/min — ABNORMAL LOW (ref >60)
Glucose, Bld: 86 mg/dL (ref 70–99)
Potassium: 5 mmol/L (ref 3.5–5.1)
Sodium: 140 mmol/L (ref 135–145)
Total Bilirubin: 0.5 mg/dL (ref 0.0–1.2)
Total Protein: 6.7 g/dL (ref 6.5–8.1)

## 2023-04-09 LAB — LACTATE DEHYDROGENASE: LDH: 159 U/L (ref 98–192)

## 2023-04-09 LAB — URIC ACID: Uric Acid, Serum: 5.2 mg/dL (ref 3.7–8.6)

## 2023-04-09 NOTE — Assessment & Plan Note (Signed)
-   s/p Left partial nephrectomy 02/04/2019. No further treatments required post-surgery. Follow-up scans were normal at his urologist office. Current kidney function slightly low, possibly related to surgery. - Monitor kidney function through regular blood tests

## 2023-04-09 NOTE — Assessment & Plan Note (Signed)
-  Chronic, stable, mild anemia, likely anemia of chronic disease related to CKD.  No hematological intervention warranted at current levels.  Will continue close monitoring.

## 2023-04-09 NOTE — Assessment & Plan Note (Addendum)
 Referred by cardiologist for further evaluation of MGUS/monoclonal gammopathy.   Please review HPI above for details and timeline of events.    SPEP on 03/14/2023 showed small amount of M spike at 0.2 g/dL.  Though he has mild renal dysfunction and mild anemia, clinical picture is not concerning for amyloidosis or plasma cell neoplasm at this time.  Hemoglobin has been consistently above 10.   Labs today revealed hemoglobin of 12.2, hematocrit 38, MCV 92.7.  White count and platelet count are within normal limits.  Creatinine 1.42, closer to his baseline.  Calcium  is not elevated and it is 9.3.  Overall unremarkable CMP otherwise.  Will pursue additional workup with SPEP, IFE, serum free light chains, quantitative immunoglobulins.  Request also submitted for 24-hour urine protein electrophoresis, IFE and free light chain analysis.   Depending on above-mentioned results, discussed potential need for fat pad biopsy or bone marrow biopsy if protein levels are high. Explained that 5-10% of individuals above 31 years of age may show faint protein levels without significant disease.  Discussed pathogenesis of MGUS/monoclonal gammopathy and need for continued monitoring.  Patient was provided reassurance.  We will arrange for a phone call visit in 2 weeks to discuss above-mentioned results.  Tentative follow-up appointment scheduled in approximately 8 weeks to follow-up on results from cardiac MRI and additional workup will be pursued as needed.

## 2023-04-09 NOTE — Assessment & Plan Note (Signed)
Experiences dizziness and has a history of hypotension. Currently on midodrine. Cardiologists aware of dizziness component, leading to current workup. - Continue midodrine

## 2023-04-09 NOTE — Progress Notes (Signed)
 [facility]  HEMATOLOGY CLINIC CONSULTATION NOTE   PATIENT NAME: Donald [name]   MR#: [id number] DOB: 01/04/1946  DATE OF SERVICE: 04/09/2023   REFERRING PHYSICIAN  Raphael Bring, PA-C, Cardiology.   Patient Care Team: Catherine Charlies LABOR, DO as PCP - General (Family Medicine) Dann Candyce GORMAN, MD as PCP - Cardiology (Cardiology) Kelsie Agent, MD (Inactive) as PCP - Electrophysiology (Cardiology) Steen Evalene CROME., MD as Consulting Physician (Urology) Dann Candyce GORMAN, MD as Consulting Physician (Cardiology) Alvaro Ricardo KATHEE Raddle., MD as Consulting Physician (Urology) Joshua Blamer, MD as Consulting Physician (Dermatology) Bring Raphael SAILOR, PA-C as Physician Assistant (Cardiology)   REASON FOR CONSULTATION/ CHIEF COMPLAINT:  Monoclonal gammopathy.  ASSESSMENT & PLAN:  Donald [name] is a 78 y.o. gentleman with a past medical history of CAD s/p CABG 2001, HLD, renal cancer s/p L partial nephrectomy in December 2020, CKD 3b, PAF/atrial flutter s/p flutter ablation 2006, 2011, baseline sinus bradycardia, normocytic anemia, hypotension requiring midodrine , BPH, colon polyps, kidney stones, RBBB, chronic dizziness, was referred to our service for evaluation of monoclonal gammopathy.  Monoclonal gammopathy Referred by cardiologist for further evaluation of MGUS/monoclonal gammopathy.   Please review HPI above for details and timeline of events.    SPEP on 03/14/2023 showed small amount of M spike at 0.2 g/dL.  Though he has mild renal dysfunction and mild anemia, clinical picture is not concerning for amyloidosis or plasma cell neoplasm at this time.  Hemoglobin has been consistently above 10.   Labs today revealed hemoglobin of 12.2, hematocrit 38, MCV 92.7.  White count and platelet count are within normal limits.  Creatinine 1.42, closer to his baseline.  Calcium  is not elevated and it is 9.3.  Overall unremarkable CMP otherwise.  Will pursue additional workup with  SPEP, IFE, serum free light chains, quantitative immunoglobulins.  Request also submitted for 24-hour urine protein electrophoresis, IFE and free light chain analysis.   Depending on above-mentioned results, discussed potential need for fat pad biopsy or bone marrow biopsy if protein levels are high. Explained that 5-10% of individuals above 80 years of age may show faint protein levels without significant disease.  Discussed pathogenesis of MGUS/monoclonal gammopathy and need for continued monitoring.  Patient was provided reassurance.  We will arrange for a phone call visit in 2 weeks to discuss above-mentioned results.  Tentative follow-up appointment scheduled in approximately 8 weeks to follow-up on results from cardiac MRI and additional workup will be pursued as needed.  Hypotension Experiences dizziness and has a history of hypotension. Currently on midodrine . Cardiologists aware of dizziness component, leading to current workup. - Continue midodrine   H/O renal cell carcinoma - s/p Left partial nephrectomy 02/04/2019. No further treatments required post-surgery. Follow-up scans were normal at his urologist office. Current kidney function slightly low, possibly related to surgery. - Monitor kidney function through regular blood tests  Normocytic anemia -Chronic, stable, mild anemia, likely anemia of chronic disease related to CKD.  No hematological intervention warranted at current levels.  Will continue close monitoring.   I reviewed lab results and outside records for this visit and discussed relevant results with the patient. Diagnosis, plan of care and treatment options were also discussed in detail with the patient. Opportunity provided to ask questions and answers provided to his apparent satisfaction. Provided instructions to call our clinic with any problems, questions or concerns prior to return visit. I recommended to continue follow-up with PCP and sub-specialists. He  verbalized understanding and agreed with  the plan. No barriers to learning was detected.  Chinita Patten, MD  04/09/2023 2:15 PM  [facility] - A DEPT OF [facility] [address] [place] [address] Dept: [phone] Dept Fax: [phone]   HISTORY OF PRESENT ILLNESS:  Discussed the use of AI scribe software for clinical note transcription with the patient, who gave verbal consent to proceed.   He was referred by his cardiologist due to a blood test result that raised suspicion for amyloidosis. The patient reports experiencing dizziness, which has been a concern for his cardiologists. The patient's heart rhythm has been irregular at times, but no medications are currently being taken that would slow the heart rate. The patient had a kidney tumor removed in 2020 and has had no further treatments since the surgery. The patient's kidney function has been slightly low since the surgery, but it is not a cause for concern at this time. The patient's blood pressure tends to run low, and he is on midodrine  to help manage this. The patient's potassium levels have been slightly high on a few occasions, but the levels were normal at the last check.  Because of dizziness, his cardiologist pursued workup to rule out amyloidosis.  The PYP scan was read as equivocal meaning neither yes nor no for cardiac amyloidosis. Dr. Santo thinks it is likely negative. However, given the clinical history cardiac MRI was suggested and he is scheduled for cardiac MRI on 05/10/2023.  As part of workup, on 03/14/2023, serum protein electrophoresis and random urine protein electrophoresis were obtained.  SPEP showed 0.2 g/dL of M spike.  IFE was not obtained.  Urine protein electrophoresis was unremarkable without evidence of M spike.  CBC showed hemoglobin of 12.7, MCV 94.  Normal white count and platelet count.  CMP showed creatinine of 1.45, closer  to his baseline.  Calcium  was normal at 9.4.  He was referred to us  for further evaluation of MGUS/monoclonal gammopathy.  He denies fever, cough, diarrhea, or other infectious symptoms.  He denies epistaxis, bloody stool, melena, hematuria, bruising or other bleeding symptoms. He also denies unintentional weight loss, night sweats or other constitutional symptoms.  MEDICAL HISTORY Past Medical History:  Diagnosis Date   Age-related nuclear cataract, bilateral 06/29/2020   Allergic rhinitis    BPH (benign prostatic hyperplasia)    Chicken pox    Coronary artery disease    a. MI 2003 with CABG.   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    Osteoarthritis of knee 05/13/2017   Renal cell carcinoma (HCC) 02/04/2019   Partial nephrectomyKIDNEY, LEFT, PARTIAL NEPHRECTOMY:    Right ureteral stone    S/P ablation of atrial flutter    followed by dr kelsie (EP cardiolgoist)  ablation 04-17-2004  and by dr kelsie 02-10- 2011 was successful   S/P CABG x 5 08-05-2001   in [place] , [place]   LIMA to LAD,  seqVG to Diagonal,  SeqVG to OM,  seqVG to PDA and PLA   Seasonal allergies    Sinus bradycardia    Wears glasses  SURGICAL HISTORY Past Surgical History:  Procedure Laterality Date   CARDIAC CATHETERIZATION  08-04-2001  in [place], [place]   severe 3 vessel cad   CARDIAC ELECTROPHYSIOLOGY MAPPING AND ABLATION  x2  04-17-2004 [place], [place]);  04-14-2009 by dr kelsie   per dr allred report-- successful ablation clockwise isthmus-dependent right atrial flutter along the usual cavotricupid isthmus;  complete bidirectionl isthmus block achieved   CORONARY ARTERY BYPASS GRAFT  08-06-2003  in  [place], [place]   x5-- LIMA - LAD,  seqVG to Diagonal, OM, PDA, and PLA   CYSTOSCOPY WITH RETROGRADE PYELOGRAM, URETEROSCOPY AND STENT PLACEMENT Right 08/10/2016   Procedure: CYSTOSCOPY WITH RETROGRADE PYELOGRAM, URETEROSCOPY AND STENT PLACEMENT;  Surgeon: Alvaro Hummer, MD;  Location: [facility];  Service: Urology;  Laterality: Right;   FIXATION RIGHT WRIST Right 2011   stability for arthritis   INGUINAL HERNIA REPAIR Right 08-08-2015   [facility] care at [facility]   ROBOTIC ASSITED PARTIAL NEPHRECTOMY Left 02/04/2019   Procedure: XI ROBOTIC ASSITED LEFT PARTIAL NEPHRECTOMY; LEFT PARTIAL ADRENALECTOMY;  Surgeon: Alvaro Hummer, MD;  Location: [facility];  Service: Urology;  Laterality: Left;  3 HRS   ROTATOR CUFF REPAIR Right 2004;  2007   TOTAL KNEE ARTHROPLASTY Left 11/30/2020   Procedure: TOTAL KNEE ARTHROPLASTY;  Surgeon: Duwayne Purchase, MD;  Location: [facility];  Service: Orthopedics;  Laterality: Left;   WISDOM TOOTH EXTRACTION       SOCIAL HISTORY: He reports that he has never smoked. He has been exposed to tobacco smoke. His smokeless tobacco use includes snuff. He reports that he does not currently use alcohol. He reports that he does not use drugs. Social History   Socioeconomic History   Marital status: Married    Spouse name: Not on file   Number of children: Not on file   Years of education: Not on file   Highest education level: Not on file  Occupational History   Occupation: retired  Tobacco Use   Smoking status: Never    Passive exposure: Past   Smokeless tobacco: Current    Types: Snuff   Tobacco comments:    03/16/2021 ~ dip tobacco for 30 yrs  Vaping Use   Vaping status: Never Used  Substance and Sexual Activity   Alcohol use: Not Currently    Comment: occ.    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; Audiological Scientist Situation: lives with wife      Spiritual Beliefs: Christian      Lifestyle: tries to make it to the gym 5 days per week; healthy diet     -smoke alarm in the home:Yes     - wears seatbelt: Yes     - Feels safe in their relationships: Yes         Social Drivers of Corporate Investment Banker Strain: Low Risk  (07/11/2022)   Overall Financial Resource Strain (CARDIA)    Difficulty of Paying Living Expenses: Not hard at all  Food Insecurity: No Food Insecurity (04/09/2023)   Hunger Vital Sign    Worried About Running Out of Food in the Last Year: Never true    Ran Out of Food in the Last Year: Never true  Transportation Needs: No Transportation Needs (04/09/2023)   PRAPARE -  Administrator, Civil Service (Medical): No    Lack of Transportation (Non-Medical): No  Physical Activity: Sufficiently Active (07/11/2022)   Exercise Vital Sign    Days of Exercise per Week: 5 days    Minutes of Exercise per Session: 60 min  Stress: No Stress Concern Present (07/11/2022)   [employer] of Occupational Health - Occupational Stress Questionnaire    Feeling of Stress : Not at all  Social Connections: Moderately Isolated (07/11/2022)   Social Connection and Isolation Panel [NHANES]    Frequency of Communication with Friends and Family: More than three times a week    Frequency of Social Gatherings with Friends and Family: More than three times a week    Attends Religious Services: Never    Database Administrator or Organizations: No    Attends Banker Meetings: Never    Marital Status: Married  Catering Manager Violence: Not At Risk (04/09/2023)   Humiliation, Afraid, Rape, and Kick questionnaire    Fear of Current or Ex-Partner: No    Emotionally Abused: No    Physically Abused: No    Sexually Abused: No    FAMILY HISTORY: His family history includes Alzheimer's disease in his mother; Cancer in his father; Heart attack in his  mother.  CURRENT MEDICATIONS   Current Outpatient Medications  Medication Instructions   acetaminophen  (TYLENOL ) 500 MG tablet Take by mouth.   amiodarone  (PACERONE ) 200 mg, Oral, Daily   apixaban  (ELIQUIS ) 5 MG TABS tablet Take 1 tablet by mouth twice daily   atorvastatin  (LIPITOR) 40 mg, Daily   cephALEXin  (KEFLEX ) 500 MG capsule As needed   midodrine  (PROAMATINE ) 5 mg, Oral, 3 times daily with meals   Multiple Vitamin (MULTIVITAMIN ADULT PO) 1 tablet, Daily   Zinc 50 MG TABS 1 tablet, Daily     ALLERGIES  He has no known allergies.  REVIEW OF SYSTEMS:  Review of Systems - Oncology   Rest of the pertinent review of systems is unremarkable except as mentioned above in HPI.  PHYSICAL EXAMINATION:   Onc Performance Status - 04/09/23 0917       ECOG Perf Status   ECOG Perf Status Restricted in physically strenuous activity but ambulatory and able to carry out work of a light or sedentary nature, e.g., light house work, office work      KPS SCALE   KPS % SCORE Able to carry on normal activity, minor s/s of disease             Vitals:   04/09/23 0907 04/09/23 0917  BP: 131/68   Pulse: (!) 58 (!) 59  Resp: 20   Temp: 97.6 F (36.4 C)   SpO2: 100%    Filed Weights   04/09/23 0907  Weight: 156 lb 11.2 oz (71.1 kg)    Physical Exam Constitutional:      General: He is not in acute distress.    Appearance: Normal appearance.  HENT:     Head: Normocephalic and atraumatic.  Eyes:     General: No scleral icterus.    Conjunctiva/sclera: Conjunctivae normal.  Cardiovascular:     Rate and Rhythm: Normal rate and regular rhythm.     Heart sounds: Normal heart sounds.  Pulmonary:     Effort: Pulmonary effort is normal.     Breath sounds: Normal breath sounds.  Abdominal:     General: There is no distension.  Musculoskeletal:     Right lower leg: No edema.  Left lower leg: No edema.  Neurological:     General: No focal deficit present.     Mental Status:  He is alert and oriented to person, place, and time.  Psychiatric:        Mood and Affect: Mood normal.        Behavior: Behavior normal.        Thought Content: Thought content normal.     LABORATORY DATA:   I have reviewed the data as listed.  Results for orders placed or performed in visit on 04/09/23  Uric acid  Result Value Ref Range   Uric Acid, Serum 5.2 3.7 - 8.6 mg/dL  Lactate dehydrogenase  Result Value Ref Range   LDH 159 98 - 192 U/L  Comprehensive metabolic panel  Result Value Ref Range   Sodium 140 135 - 145 mmol/L   Potassium 5.0 3.5 - 5.1 mmol/L   Chloride 106 98 - 111 mmol/L   CO2 29 22 - 32 mmol/L   Glucose, Bld 86 70 - 99 mg/dL   BUN 27 (H) 8 - 23 mg/dL   Creatinine, Ser 8.57 (H) 0.61 - 1.24 mg/dL   Calcium  9.3 8.9 - 10.3 mg/dL   Total Protein 6.7 6.5 - 8.1 g/dL   Albumin 3.8 3.5 - 5.0 g/dL   AST 21 15 - 41 U/L   ALT 21 0 - 44 U/L   Alkaline Phosphatase 73 38 - 126 U/L   Total Bilirubin 0.5 0.0 - 1.2 mg/dL   GFR, Estimated 50 (L) >60 mL/min   Anion gap 5 5 - 15  CBC with Differential/Platelet  Result Value Ref Range   WBC 7.0 4.0 - 10.5 K/uL   RBC 4.10 (L) 4.22 - 5.81 MIL/uL   Hemoglobin 12.2 (L) 13.0 - 17.0 g/dL   HCT 61.9 (L) 60.9 - 47.9 %   MCV 92.7 80.0 - 100.0 fL   MCH 29.8 26.0 - 34.0 pg   MCHC 32.1 30.0 - 36.0 g/dL   RDW 86.1 88.4 - 84.4 %   Platelets 219 150 - 400 K/uL   nRBC 0.0 0.0 - 0.2 %   Neutrophils Relative % 69 %   Neutro Abs 4.9 1.7 - 7.7 K/uL   Lymphocytes Relative 19 %   Lymphs Abs 1.3 0.7 - 4.0 K/uL   Monocytes Relative 9 %   Monocytes Absolute 0.6 0.1 - 1.0 K/uL   Eosinophils Relative 2 %   Eosinophils Absolute 0.2 0.0 - 0.5 K/uL   Basophils Relative 1 %   Basophils Absolute 0.1 0.0 - 0.1 K/uL   Immature Granulocytes 0 %   Abs Immature Granulocytes 0.01 0.00 - 0.07 K/uL     RADIOGRAPHIC STUDIES:  I have personally reviewed the radiological images as listed and agreed with the findings in the report.  PYP  Scan Result Date: 03/25/2023   Myocardial uptake was positive for radiotracer uptake. The visual grade of myocardial uptake relative to the ribs was Grade 1 (Myocardial uptake less than rib uptake).   Findings are equivocal (Grade 1) of cardiac ATTR amyloidosis.   Recommend cardiac MRI for further evaluation    Orders Placed This Encounter  Procedures   CBC with Differential/Platelet    Standing Status:   Future    Number of Occurrences:   1    Expiration Date:   04/08/2024   Comprehensive metabolic panel    Standing Status:   Future    Number of Occurrences:   1    Expiration  Date:   04/08/2024   Lactate dehydrogenase    Standing Status:   Future    Number of Occurrences:   1    Expiration Date:   04/08/2024   Kappa/lambda light chains    Standing Status:   Future    Number of Occurrences:   1    Expiration Date:   04/08/2024   Multiple Myeloma Panel (SPEP&IFE w/QIG)    Standing Status:   Future    Number of Occurrences:   1    Expiration Date:   04/08/2024   Beta 2 microglobulin, serum    Standing Status:   Future    Number of Occurrences:   1    Expiration Date:   04/08/2024   Uric acid    Standing Status:   Future    Number of Occurrences:   1    Expiration Date:   04/08/2024   UPEP/UIFE/Light Chains/TP, 24-Hr Ur    Standing Status:   Future    Expiration Date:   04/08/2024    Future Appointments  Date Time Provider Department Center  04/22/2023  3:40 PM Vikram Tillett, Chinita, MD [facility] None  05/10/2023  9:00 AM [facility] 1 [facility]  06/10/2023 10:45 AM [facility] PHLEBOTOMIST [facility] None  06/10/2023 11:00 AM Haden Suder, MD [facility] None  07/17/2023  3:50 PM [facility] ANNUAL WELLNESS VISIT [facility]    I spent a total of 55 minutes during this encounter with the patient including review of chart and various tests results, discussions about plan of care and coordination of care plan.  This document was completed utilizing speech recognition software. Grammatical errors, random word  insertions, pronoun errors, and incomplete sentences are an occasional consequence of this system due to software limitations, ambient noise, and hardware issues. Any formal questions or concerns about the content, text or information contained within the body of this dictation should be directly addressed to the provider for clarification.

## 2023-04-10 LAB — KAPPA/LAMBDA LIGHT CHAINS
Kappa free light chain: 46.1 mg/L — ABNORMAL HIGH (ref 3.3–19.4)
Kappa, lambda light chain ratio: 2.35 — ABNORMAL HIGH (ref 0.26–1.65)
Lambda free light chains: 19.6 mg/L (ref 5.7–26.3)

## 2023-04-10 LAB — BETA 2 MICROGLOBULIN, SERUM: Beta-2 Microglobulin: 2.3 mg/L (ref 0.6–2.4)

## 2023-04-12 ENCOUNTER — Other Ambulatory Visit (HOSPITAL_BASED_OUTPATIENT_CLINIC_OR_DEPARTMENT_OTHER): Payer: Self-pay

## 2023-04-12 DIAGNOSIS — D472 Monoclonal gammopathy: Secondary | ICD-10-CM | POA: Diagnosis not present

## 2023-04-12 LAB — MULTIPLE MYELOMA PANEL, SERUM
Albumin SerPl Elph-Mcnc: 3.6 g/dL (ref 2.9–4.4)
Albumin/Glob SerPl: 1.4 (ref 0.7–1.7)
Alpha 1: 0.2 g/dL (ref 0.0–0.4)
Alpha2 Glob SerPl Elph-Mcnc: 0.7 g/dL (ref 0.4–1.0)
B-Globulin SerPl Elph-Mcnc: 0.9 g/dL (ref 0.7–1.3)
Gamma Glob SerPl Elph-Mcnc: 0.9 g/dL (ref 0.4–1.8)
Globulin, Total: 2.7 g/dL (ref 2.2–3.9)
IgA: 185 mg/dL (ref 61–437)
IgG (Immunoglobin G), Serum: 1006 mg/dL (ref 603–1613)
IgM (Immunoglobulin M), Srm: 55 mg/dL (ref 15–143)
M Protein SerPl Elph-Mcnc: 0.2 g/dL — ABNORMAL HIGH
Total Protein ELP: 6.3 g/dL (ref 6.0–8.5)

## 2023-04-17 LAB — UPEP/UIFE/LIGHT CHAINS/TP, 24-HR UR
% BETA, Urine: 0 %
ALPHA 1 URINE: 0 %
Albumin, U: 100 %
Alpha 2, Urine: 0 %
Free Kappa Lt Chains,Ur: 24.24 mg/L (ref 1.17–86.46)
Free Kappa/Lambda Ratio: 4.31 (ref 1.83–14.26)
Free Lambda Lt Chains,Ur: 5.62 mg/L (ref 0.27–15.21)
GAMMA GLOBULIN URINE: 0 %
Total Protein, Urine-Ur/day: 87 mg/(24.h) (ref 30–150)
Total Protein, Urine: 4.6 mg/dL
Total Volume: 1900

## 2023-04-22 ENCOUNTER — Inpatient Hospital Stay: Payer: HMO | Admitting: Oncology

## 2023-04-23 ENCOUNTER — Encounter: Payer: Self-pay | Admitting: Oncology

## 2023-04-23 ENCOUNTER — Inpatient Hospital Stay: Payer: HMO | Admitting: Oncology

## 2023-04-23 VITALS — BP 137/66 | HR 60 | Temp 98.1°F | Resp 18 | Ht 65.0 in | Wt 157.9 lb

## 2023-04-23 DIAGNOSIS — D472 Monoclonal gammopathy: Secondary | ICD-10-CM

## 2023-04-23 DIAGNOSIS — D649 Anemia, unspecified: Secondary | ICD-10-CM | POA: Diagnosis not present

## 2023-04-23 NOTE — Progress Notes (Unsigned)
[facility]  HEMATOLOGY CLINIC PROGRESS NOTE  PATIENT NAME: Donald [name]   MR#: [id number] DOB: May 13, 1945  Patient Care Team: Natalia Leatherwood, DO as PCP - General (Family Medicine) Corky Crafts, MD as PCP - Cardiology (Cardiology) Hillis Range, MD (Inactive) as PCP - Electrophysiology (Cardiology) Annice Pih., MD as Consulting Physician (Urology) Corky Crafts, MD as Consulting Physician (Cardiology) Berneice Heinrich Delbert Phenix., MD as Consulting Physician (Urology) Donzetta Starch, MD as Consulting Physician (Dermatology) Estrella Deeds as Physician Assistant (Cardiology)  Date of visit: 04/23/2023   ASSESSMENT & PLAN:   Donald [name] is a 78 y.o. gentleman with a past medical history of CAD s/p CABG 2001, HLD, renal cancer s/p L partial nephrectomy in December 2020, CKD 3b, PAF/atrial flutter s/p flutter ablation 2006, 2011, baseline sinus bradycardia, normocytic anemia, hypotension requiring midodrine, BPH, colon polyps, kidney stones, RBBB, chronic dizziness, was referred to our service in February 2025 for evaluation of monoclonal gammopathy.  Workup consistent with MGUS.  No problem-specific Assessment & Plan notes found for this encounter.    I spent a total of {CHL ONC TIME VISIT - QMVHQ:[id number]} during this encounter with the patient including review of chart and various tests results, discussions about plan of care and coordination of care plan.  I reviewed lab results and outside records for this visit and discussed relevant results with the patient. Diagnosis, plan of care and treatment options were also discussed in detail with the patient. Opportunity provided to ask questions and answers provided to his apparent satisfaction. Provided instructions to call our clinic with any problems, questions or concerns prior to return visit. I recommended to continue follow-up with PCP and sub-specialists. He verbalized understanding and agreed  with the plan. No barriers to learning was detected.  Meryl Crutch, MD  04/23/2023 3:41 PM  [facility] - A DEPT OF [facility] [address] [place] [address] Dept: [phone] Dept Fax: [phone]   CHIEF COMPLAINT/ REASON FOR VISIT:  ***  INTERVAL HISTORY:  Discussed the use of AI scribe software for clinical note transcription with the patient, who gave verbal consent to proceed.   Patient returns for ***  SUMMARY OF HEMATOLOGIC HISTORY:  He was referred by his cardiologist due to a blood test result that raised suspicion for amyloidosis. The patient reports experiencing dizziness, which has been a concern for his cardiologists. The patient's heart rhythm has been irregular at times, but no medications are currently being taken that would slow the heart rate. The patient had a kidney tumor removed in 2020 and has had no further treatments since the surgery. The patient's kidney function has been slightly low since the surgery, but it is not a cause for concern at this time. The patient's blood pressure tends to run low, and he is on midodrine to help manage this. The patient's potassium levels have been slightly high on a few occasions, but the levels were normal at the last check.   Because of dizziness, his cardiologist pursued workup to rule out amyloidosis.  The PYP scan was read as equivocal meaning neither yes nor no for cardiac amyloidosis. Dr. Izora Ribas thinks it is likely negative. However, given the clinical history cardiac MRI was suggested and he is scheduled for cardiac MRI on 05/10/2023.   As part of workup, on 03/14/2023, serum protein electrophoresis and random urine protein electrophoresis were obtained.  SPEP showed 0.2  g/dL of M spike.  IFE was not obtained.  Urine protein electrophoresis was unremarkable without evidence of M spike.  CBC showed hemoglobin of 12.7, MCV 94.  Normal white  count and platelet count.  CMP showed creatinine of 1.45, closer to his baseline.  Calcium was normal at 9.4.   He was referred to [place] for further evaluation of MGUS/monoclonal gammopathy.   He denies fever, cough, diarrhea, or other infectious symptoms.  He denies epistaxis, bloody stool, melena, hematuria, bruising or other bleeding symptoms. He also denies unintentional weight loss, night sweats or other constitutional symptoms.  SPEP on 03/14/2023 showed small amount of M spike at 0.2 g/dL.  Though he has mild renal dysfunction and mild anemia, clinical picture is not concerning for amyloidosis or plasma cell neoplasm at this time.  Hemoglobin has been consistently above 10.    On his initial consultation with [place] on 04/09/2023, labs revealed hemoglobin of 12.2, hematocrit 38, MCV 92.7.  White count and platelet count were within normal limits.  Creatinine 1.42, closer to his baseline.  Calcium was not elevated and it was 9.3.  Overall unremarkable CMP otherwise.  SPEP showed 0.2 g/dL of M spike.  IFE showed a IgG kappa monoclonal protein.  Quantitative immunoglobulins were all within normal limits.  Serum free kappa was 46.1 mg/L, lambda was normal at 19.6 mg/L, ratio 2.35.    On 04/12/2023, 24-hour urine protein electrophoresis showed no M spike.  Urine IFE was unremarkable.  Overall picture is consistent with IgG kappa MGUS.  Will continue close surveillance.   Explained that 5-10% of individuals above 80 years of age may show faint protein levels without significant disease.   Discussed pathogenesis of MGUS/monoclonal gammopathy and need for continued monitoring.  Patient was provided reassurance.   Tentative follow-up appointment scheduled in approximately 8 weeks to follow-up on results from cardiac MRI and additional workup will be pursued as needed.  I have reviewed the past medical history, past surgical history, social history and family history with the patient and they are unchanged from  previous note.  ALLERGIES: He has no known allergies.  MEDICATIONS:  Current Outpatient Medications  Medication Sig Dispense Refill   amiodarone (PACERONE) 200 MG tablet Take 1 tablet (200 mg total) by mouth daily. 90 tablet 2   apixaban (ELIQUIS) 5 MG TABS tablet Take 1 tablet by mouth twice daily 60 tablet 5   atorvastatin (LIPITOR) 40 MG tablet Take 40 mg by mouth daily.     midodrine (PROAMATINE) 5 MG tablet Take 1 tablet (5 mg total) by mouth 3 (three) times daily with meals. 270 tablet 3   Multiple Vitamin (MULTIVITAMIN ADULT PO) Take 1 tablet by mouth daily.     Zinc 50 MG TABS Take 1 tablet by mouth daily at 12 noon.     acetaminophen (TYLENOL) 500 MG tablet Take by mouth. (Patient not taking: Reported on 04/23/2023)     cephALEXin (KEFLEX) 500 MG capsule Take by mouth as needed. Prior to dental appointments (Patient not taking: Reported on 04/23/2023)     No current facility-administered medications for this visit.     REVIEW OF SYSTEMS:    ROS  All other pertinent systems were reviewed with the patient and are negative.  PHYSICAL EXAMINATION:  ***  Onc Performance Status - 04/23/23 1529       ECOG Perf Status   ECOG Perf Status Restricted in physically strenuous activity but ambulatory and able to carry out work of a light or  sedentary nature, e.g., light house work, office work      KPS SCALE   KPS % SCORE Able to carry on normal activity, minor s/s of disease             Vitals:   04/23/23 1523 04/23/23 1524  BP: (!) 141/85 137/66  Pulse: 60   Resp: 18   Temp: 98.1 F (36.7 C)   SpO2: 98%    Filed Weights   04/23/23 1523  Weight: 157 lb 14.4 oz (71.6 kg)    Physical Exam ***  LABORATORY DATA:   I have reviewed the data as listed.  No results found for any visits on 04/23/23.  Lab Results  Component Value Date   WBC 7.0 04/09/2023   NEUTROABS 4.9 04/09/2023   HGB 12.2 (L) 04/09/2023   HCT 38.0 (L) 04/09/2023   MCV 92.7 04/09/2023   PLT  219 04/09/2023       Component Value Date/Time   NA 140 04/09/2023 1015   NA 144 03/14/2023 0949   K 5.0 04/09/2023 1015   CL 106 04/09/2023 1015   CO2 29 04/09/2023 1015   GLUCOSE 86 04/09/2023 1015   BUN 27 (H) 04/09/2023 1015   BUN 29 (H) 03/14/2023 0949   CREATININE 1.42 (H) 04/09/2023 1015   CREATININE 0.98 07/04/2020 1537   CALCIUM 9.3 04/09/2023 1015   PROT 6.7 04/09/2023 1015   PROT 6.3 03/14/2023 0949   ALBUMIN 3.8 04/09/2023 1015   ALBUMIN 3.9 03/14/2023 0949   AST 21 04/09/2023 1015   ALT 21 04/09/2023 1015   ALKPHOS 73 04/09/2023 1015   BILITOT 0.5 04/09/2023 1015   BILITOT 0.5 03/14/2023 0949   GFRNONAA 50 (L) 04/09/2023 1015   GFRAA 97 12/11/2019 1035    No results found for: "SPEP", "UPEP"    Chemistry      Component Value Date/Time   NA 140 04/09/2023 1015   NA 144 03/14/2023 0949   K 5.0 04/09/2023 1015   CL 106 04/09/2023 1015   CO2 29 04/09/2023 1015   BUN 27 (H) 04/09/2023 1015   BUN 29 (H) 03/14/2023 0949   CREATININE 1.42 (H) 04/09/2023 1015   CREATININE 0.98 07/04/2020 1537      Component Value Date/Time   CALCIUM 9.3 04/09/2023 1015   ALKPHOS 73 04/09/2023 1015   AST 21 04/09/2023 1015   ALT 21 04/09/2023 1015   BILITOT 0.5 04/09/2023 1015   BILITOT 0.5 03/14/2023 0949        RADIOGRAPHIC STUDIES:  I have personally reviewed the radiological images as listed and agree with the findings in the report.  PYP Scan Result Date: 03/25/2023   Myocardial uptake was positive for radiotracer uptake. The visual grade of myocardial uptake relative to the ribs was Grade 1 (Myocardial uptake less than rib uptake).   Findings are equivocal (Grade 1) of cardiac ATTR amyloidosis.   Recommend cardiac MRI for further evaluation    *** No recent pertinent imaging studies available to review.  No orders of the defined types were placed in this encounter.    Future Appointments  Date Time Provider Department Center  05/10/2023  9:00 AM [facility] 1  [facility]  06/10/2023 10:45 AM [facility] PHLEBOTOMIST [facility] None  06/10/2023 11:00 AM [place] Spees, Archie Patten, MD [facility] None  07/17/2023  3:50 PM [facility] ANNUAL WELLNESS VISIT [facility]     This document was completed utilizing speech recognition software. Grammatical errors, random word insertions, pronoun errors, and incomplete sentences are an  occasional consequence of this system due to software limitations, ambient noise, and hardware issues. Any formal questions or concerns about the content, text or information contained within the body of this dictation should be directly addressed to the provider for clarification.

## 2023-04-23 NOTE — Assessment & Plan Note (Signed)
-  Chronic, stable, mild anemia, likely anemia of chronic disease related to CKD.  No hematological intervention warranted at current levels.  Will continue close monitoring.

## 2023-04-23 NOTE — Assessment & Plan Note (Signed)
Referred by cardiologist for further evaluation of MGUS/monoclonal gammopathy.   Please review HPI above for details and timeline of events.    SPEP on 03/14/2023 showed small amount of M spike at 0.2 g/dL.  Though he has mild renal dysfunction and mild anemia, clinical picture is not concerning for amyloidosis or plasma cell neoplasm at this time.  Hemoglobin has been consistently above 10.   On his initial consultation with [place] on 04/09/2023, labs revealed hemoglobin of 12.2, hematocrit 38, MCV 92.7.  White count and platelet count were within normal limits.  Creatinine 1.42, closer to his baseline.  Calcium was not elevated and it was 9.3.  Overall unremarkable CMP otherwise.  SPEP showed 0.2 g/dL of M spike.  IFE showed a IgG kappa monoclonal protein.  Quantitative immunoglobulins were all within normal limits.  Serum free kappa was 46.1 mg/L, lambda was normal at 19.6 mg/L, ratio 2.35.     On 04/12/2023, 24-hour urine protein electrophoresis showed no M spike.  Urine IFE was unremarkable.   Overall picture is consistent with IgG kappa MGUS.  Will continue close surveillance.   Explained that 5-10% of individuals above 33 years of age may show faint protein levels without significant disease.   Discussed pathogenesis of MGUS/monoclonal gammopathy and need for continued monitoring.  Patient was provided reassurance.   Tentative follow-up appointment scheduled in approximately 8 weeks to follow-up on results from cardiac MRI and additional workup will be pursued as needed.

## 2023-05-08 ENCOUNTER — Encounter (HOSPITAL_COMMUNITY): Payer: Self-pay

## 2023-05-10 ENCOUNTER — Other Ambulatory Visit: Payer: Self-pay | Admitting: Physician Assistant

## 2023-05-10 ENCOUNTER — Ambulatory Visit (HOSPITAL_COMMUNITY)
Admission: RE | Admit: 2023-05-10 | Discharge: 2023-05-10 | Disposition: A | Discharge status: Home / Self Care | Payer: HMO | Source: Ambulatory Visit | Attending: Physician Assistant | Admitting: Physician Assistant

## 2023-05-10 DIAGNOSIS — I517 Cardiomegaly: Secondary | ICD-10-CM

## 2023-05-10 MED ORDER — GADOBUTROL 1 MMOL/ML IV SOLN
7.0000 mL | Freq: Once | INTRAVENOUS | Status: AC | PRN
  Administered 2023-05-10: 7 mL via INTRAVENOUS

## 2023-05-13 DIAGNOSIS — C4401 Basal cell carcinoma of skin of lip: Secondary | ICD-10-CM | POA: Diagnosis not present

## 2023-05-13 DIAGNOSIS — D485 Neoplasm of uncertain behavior of skin: Secondary | ICD-10-CM | POA: Diagnosis not present

## 2023-05-13 DIAGNOSIS — C44619 Basal cell carcinoma of skin of left upper limb, including shoulder: Secondary | ICD-10-CM | POA: Diagnosis not present

## 2023-05-13 DIAGNOSIS — Z85828 Personal history of other malignant neoplasm of skin: Secondary | ICD-10-CM | POA: Diagnosis not present

## 2023-05-13 DIAGNOSIS — L570 Actinic keratosis: Secondary | ICD-10-CM | POA: Diagnosis not present

## 2023-05-13 DIAGNOSIS — L821 Other seborrheic keratosis: Secondary | ICD-10-CM | POA: Diagnosis not present

## 2023-06-05 NOTE — Progress Notes (Unsigned)
 Cardiology Office Note:  .   Date:  06/06/2023  ID:  Verdon Cummins, DOB August 09, 1945, MRN [id number] PCP: Natalia Leatherwood, DO  [facility] Providers Cardiologist:  Lance Muss, MD Cardiology APP:  Laurann Montana, PA-C  Electrophysiologist:  Hillis Range, MD (Inactive) {  History of Present Illness: .   Donald [name] is a 78 y.o. male with a past medical history of CAD status post CABG in 2001, HLD, renal cancer status post left nephrectomy, suspected CKD 3B, PAF/atrial flutter status post flutter ablation 2006, 2011, baseline sinus bradycardia not on AVN blocking agent, anemia, hypotension requiring midodrine, BPH, colon polyps, kidney stones, RBBB, chronic dizziness here for follow-up appointment.  Remote subsequent ablations in 2006 and 2011 then unfortunately developed recurrent A-fib in 2022.  Follow-up with A-fib clinic.  Also has baseline sinus bradycardia and issues with orthostasis requiring midodrine.  Last echo 2022 with EF 77 Percent, hyperdynamic function, no significant valve disease noted.  At last A-fib clinic visit 12/2022 he reported feeling more woozy/lightheaded with activity.  Monitor 12/2022 showed predominantly NSR with ranges 45 to 103 bpm, average 58 bpm, 1 pause 4.4 seconds (6:35 AM), less than 1% PACs/PVCs.  Patient triggered episodes were associated with sinus rhythm and is felt to be a reassuring monitor.  Was seen in follow-up December 2020 for was overall doing well.  Had some episodes of dizziness that significantly decreased since the summertime, only 1 episode in the last month or so.  Denies syncope.  Denies chest pain, SOB, palpitations, edema, history of carpal tunnel or spinal stenosis.  Today, he presents with a history of atrial fibrillation with ongoing episodes of dizziness and low blood pressure. These episodes often occur after light physical activity, such as working outside. The patient's spouse reports that during these episodes, the patient's  blood pressure can drop significantly, with readings as low as 88/50. These episodes typically last around 45 minutes before the patient's blood pressure returns to normal. The patient's heart rate remains relatively stable during these episodes, typically ranging between 52 and 61 beats per minute. The patient is currently on amiodarone 200mg  and midodrine 5mg  three times a day for his atrial fibrillation. The patient also has a history of two ablations in 2006 and 2011.  Reports no shortness of breath nor dyspnea on exertion. Reports no chest pain, pressure, or tightness. No edema, orthopnea, PND. Reports no palpitations.   Discussed the use of AI scribe software for clinical note transcription with the patient, who gave verbal consent to proceed.  ROS: pertinent ROS in HPI  Studies Reviewed: .       Echo 05/10/23  IMPRESSION: 1.  Normal LV size and function LVEF 55%   2.  No evidence of amyloid   3.  No delayed gadolinium uptake normal myocardial nulling   4.  Normal RV size and function RVEF 54%   5.  Normal cardiac valves   6.  Normal parametric measures see values above   7.  Estimated cardiac output using flow analysis 4.7 L/min       Physical Exam:   VS:  BP (!) 168/80   Pulse (!) 52   Ht 5\' 5"  (1.651 m)   Wt 155 lb 6.4 oz (70.5 kg)   SpO2 98%   BMI 25.86 kg/m    Wt Readings from Last 3 Encounters:  06/06/23 155 lb 6.4 oz (70.5 kg)  04/23/23 157 lb 14.4 oz (71.6 kg)  04/09/23 156 lb 11.2  oz (71.1 kg)    GEN: Well nourished, well developed in no acute distress NECK: No JVD; No carotid bruits CARDIAC: RRR, no murmurs, rubs, gallops RESPIRATORY:  Clear to auscultation without rales, wheezing or rhonchi  ABDOMEN: Soft, non-tender, non-distended EXTREMITIES:  No edema; No deformity   ASSESSMENT AND PLAN: .    Orthostatic Hypotension Chronic orthostatic hypotension with dizziness and lightheadedness post-activity. Significant blood pressure drops upon standing.  Amiodarone and midodrine may contribute to fluctuations. Cardiac MRI and echocardiogram normal. - Reduce amiodarone to 100 mg daily by halving 200 mg tablets. - Monitor blood pressure and symptoms post-dose adjustment. - Encourage hydration and electrolyte replacement during physical activity using Gatorade or Liquid IV. - Recommend wearing compression socks during the day, suggesting custom compression from [employer]. - Order carotid ultrasound to assess for stenosis.  Atrial Fibrillation Atrial fibrillation managed with amiodarone. Heart rate remains low, indicating effective rate control. Dose reduction safe given well-managed heart rate and successful ablations. - Continue amiodarone at reduced dose of 100 mg daily. - Monitor for atrial fibrillation symptom recurrence.  Anticoagulation Management On Eliquis for anticoagulation. Potential dose adjustment upon turning 78, contingent on kidney function and weight. Current creatinine is 1.42, borderline for dose adjustment. - Reassess kidney function and creatinine levels around 78th birthday to determine Eliquis dose adjustment necessity.  Follow-up - Ensure follow-up with hematologist on April 7 for further evaluation.   Dispo: Follow-up in 4-6 weeks with APP  Signed, Sharlene Dory, PA-C

## 2023-06-06 ENCOUNTER — Ambulatory Visit: Attending: Physician Assistant | Admitting: Physician Assistant

## 2023-06-06 ENCOUNTER — Encounter: Payer: Self-pay | Admitting: Physician Assistant

## 2023-06-06 VITALS — BP 168/80 | HR 52 | Ht 65.0 in | Wt 155.4 lb

## 2023-06-06 DIAGNOSIS — I4892 Unspecified atrial flutter: Secondary | ICD-10-CM

## 2023-06-06 DIAGNOSIS — I480 Paroxysmal atrial fibrillation: Secondary | ICD-10-CM

## 2023-06-06 DIAGNOSIS — R42 Dizziness and giddiness: Secondary | ICD-10-CM

## 2023-06-06 DIAGNOSIS — I2510 Atherosclerotic heart disease of native coronary artery without angina pectoris: Secondary | ICD-10-CM | POA: Diagnosis not present

## 2023-06-06 MED ORDER — AMIODARONE HCL 100 MG PO TABS: 100.0000 mg | ORAL_TABLET | Freq: Every day | ORAL | 1 refills | Status: DC

## 2023-06-06 NOTE — Patient Instructions (Addendum)
 Medication Instructions:   DECREASE YOUR AMIODARONE TO 100 MG BY MOUTH DAILY  *If you need a refill on your cardiac medications before your next appointment, please call your pharmacy*   Testing/Procedures:  Your physician has requested that you have a carotid duplex. This test is an ultrasound of the carotid arteries in your neck. It looks at blood flow through these arteries that supply the brain with blood. Allow one hour for this exam. There are no restrictions or special instructions.   Follow-Up:  4-6 WEEKS WITH ANY EXTENDER ([facility] OR [facility]) IN THE OFFICE   Other Instructions  [facility] # [phone]  [address] [place], [place] [address]  Orthostatic Hypotension Blood pressure is a measurement of how strongly, or weakly, your circulating blood is pressing against the walls of your arteries. Orthostatic hypotension is a drop in blood pressure that can happen when you change positions, such as when you go from lying down to standing. Arteries are blood vessels that carry blood from your heart throughout your body. When blood pressure is too low, you may not get enough blood to your brain or to the rest of your organs. Orthostatic hypotension can cause light-headedness, sweating, rapid heartbeat, blurred vision, and fainting. These symptoms require further investigation into the cause. What are the causes? Orthostatic hypotension can be caused by many things, including: Sudden changes in posture, such as standing up quickly after you have been sitting or lying down. Loss of blood (anemia) or loss of body fluids (dehydration). Heart problems, neurologic problems, or hormone problems. Pregnancy. Aging. The risk for this condition increases as you get older. Severe infection (sepsis). Certain medicines, such as medicines for high blood pressure or medicines that make the body lose excess fluids (diuretics). What are the signs or symptoms? Symptoms of this condition  may include: Weakness, light-headedness, or dizziness. Sweating. Blurred vision. Tiredness (fatigue). Rapid heartbeat. Fainting, in severe cases. How is this diagnosed? This condition is diagnosed based on: Your symptoms and medical history. Your blood pressure measurements. Your health care provider will check your blood pressure when you are: Lying down. Sitting. Standing. A blood pressure reading is recorded as two numbers, such as "120 over 80" (or 120/80). The first ("top") number is called the systolic pressure. It is a measure of the pressure in your arteries as your heart beats. The second ("bottom") number is called the diastolic pressure. It is a measure of the pressure in your arteries when your heart relaxes between beats. Blood pressure is measured in a unit called mmHg. Healthy blood pressure for most adults is 120/80 mmHg. Orthostatic hypotension is defined as a 20 mmHg drop in systolic pressure or a 10 mmHg drop in diastolic pressure within 3 minutes of standing. Other information or tests that may be used to diagnose orthostatic hypotension include: Your other vital signs, such as your heart rate and temperature. Blood tests. An electrocardiogram (ECG) or echocardiogram. A Holter monitor. This is a device you wear that records your heart rhythm continuously, usually for 24-48 hours. Tilt table test. For this test, you will be safely secured to a table that moves you from a lying position to an upright position. Your heart rhythm and blood pressure will be monitored during the test. How is this treated? This condition may be treated by: Changing your diet. This may involve eating more salt (sodium) or drinking more water. Changing the dosage of certain medicines you are taking that might be lowering your blood pressure.  Correcting the underlying reason for the orthostatic hypotension. Wearing compression stockings. Taking medicines to raise your blood pressure. Avoiding  actions that trigger symptoms. Follow these instructions at home: Medicines Take over-the-counter and prescription medicines only as told by your health care provider. Follow instructions from your health care provider about changing the dosage of your current medicines, if this applies. Do not stop or adjust any of your medicines on your own. Eating and drinking  Drink enough fluid to keep your urine pale yellow. Eat extra salt only as directed. Do not add extra salt to your diet unless advised by your health care provider. Eat frequent, small meals. Avoid standing up suddenly after eating. General instructions  Get up slowly from lying down or sitting positions. This gives your blood pressure a chance to adjust. Avoid hot showers and excessive heat as directed by your health care provider. Engage in regular physical activity as directed by your health care provider. If you have compression stockings, wear them as told. Keep all follow-up visits. This is important. Contact a health care provider if: You have a fever for more than 2-3 days. You feel more thirsty than usual. You feel dizzy or weak. Get help right away if: You have chest pain. You have a fast or irregular heartbeat. You become sweaty or feel light-headed. You feel short of breath. You faint. You have any symptoms of a stroke. "BE FAST" is an easy way to remember the main warning signs of a stroke: B - Balance. Signs are dizziness, sudden trouble walking, or loss of balance. E - Eyes. Signs are trouble seeing or a sudden change in vision. F - Face. Signs are sudden weakness or numbness of the face, or the face or eyelid drooping on one side. A - Arms. Signs are weakness or numbness in an arm. This happens suddenly and usually on one side of the body. S - Speech. Signs are sudden trouble speaking, slurred speech, or trouble understanding what people say. T - Time. Time to call emergency services. Write down what time  symptoms started. You have other signs of a stroke, such as: A sudden, severe headache with no known cause. Nausea or vomiting. Seizure. These symptoms may represent a serious problem that is an emergency. Do not wait to see if the symptoms will go away. Get medical help right away. Call your local emergency services (911 in the [place] Do not drive yourself to the hospital. Summary Orthostatic hypotension is a sudden drop in blood pressure. It can cause light-headedness, sweating, rapid heartbeat, blurred vision, and fainting. Orthostatic hypotension can be diagnosed by having your blood pressure taken while lying down, sitting, and then standing. Treatment may involve changing your diet, wearing compression stockings, sitting up slowly, adjusting your medicines, or correcting the underlying reason for the orthostatic hypotension. Get help right away if you have chest pain, a fast or irregular heartbeat, or symptoms of a stroke. This information is not intended to replace advice given to you by your health care provider. Make sure you discuss any questions you have with your health care provider. Document Revised: 05/05/2020 Document Reviewed: 05/05/2020 Elsevier Patient Education  2024 [employer]      1st Floor: - Lobby - Registration  - Pharmacy  - Lab - Cafe  2nd Floor: - PV Lab - Diagnostic Testing (echo, CT, nuclear med)  3rd Floor: - Vacant  4th Floor: - TCTS (cardiothoracic surgery) - AFib Clinic - Structural Heart Clinic - Vascular Surgery  -  Vascular Ultrasound  5th Floor: - [facility] (general and EP) - Clinical Pharmacy for coumadin, hypertension, lipid, weight-loss medications, and med management appointments    Valet parking services will be available as well.

## 2023-06-10 ENCOUNTER — Inpatient Hospital Stay: Payer: HMO | Admitting: Oncology

## 2023-06-10 ENCOUNTER — Encounter: Payer: Self-pay | Admitting: Oncology

## 2023-06-10 ENCOUNTER — Inpatient Hospital Stay: Payer: HMO | Attending: Oncology

## 2023-06-10 VITALS — BP 114/70 | HR 55 | Temp 98.0°F | Resp 18 | Ht 65.0 in | Wt 155.4 lb

## 2023-06-10 DIAGNOSIS — D472 Monoclonal gammopathy: Secondary | ICD-10-CM

## 2023-06-10 DIAGNOSIS — N1832 Chronic kidney disease, stage 3b: Secondary | ICD-10-CM | POA: Diagnosis not present

## 2023-06-10 DIAGNOSIS — N1831 Chronic kidney disease, stage 3a: Secondary | ICD-10-CM | POA: Insufficient documentation

## 2023-06-10 DIAGNOSIS — D649 Anemia, unspecified: Secondary | ICD-10-CM | POA: Diagnosis not present

## 2023-06-10 DIAGNOSIS — R42 Dizziness and giddiness: Secondary | ICD-10-CM | POA: Diagnosis not present

## 2023-06-10 LAB — CMP (CANCER CENTER ONLY)
ALT: 20 U/L (ref 0–44)
AST: 20 U/L (ref 15–41)
Albumin: 4 g/dL (ref 3.5–5.0)
Alkaline Phosphatase: 63 U/L (ref 38–126)
Anion gap: 6 (ref 5–15)
BUN: 26 mg/dL — ABNORMAL HIGH (ref 8–23)
CO2: 28 mmol/L (ref 22–32)
Calcium: 8.9 mg/dL (ref 8.9–10.3)
Chloride: 107 mmol/L (ref 98–111)
Creatinine: 1.39 mg/dL — ABNORMAL HIGH (ref 0.61–1.24)
GFR, Estimated: 52 mL/min — ABNORMAL LOW (ref >60)
Glucose, Bld: 100 mg/dL — ABNORMAL HIGH (ref 70–99)
Potassium: 4.5 mmol/L (ref 3.5–5.1)
Sodium: 141 mmol/L (ref 135–145)
Total Bilirubin: 0.6 mg/dL (ref 0.0–1.2)
Total Protein: 6.6 g/dL (ref 6.5–8.1)

## 2023-06-10 LAB — CBC WITH DIFFERENTIAL (CANCER CENTER ONLY)
Abs Immature Granulocytes: 0.01 10*3/uL (ref 0.00–0.07)
Basophils Absolute: 0 10*3/uL (ref 0.0–0.1)
Basophils Relative: 1 %
Eosinophils Absolute: 0.1 10*3/uL (ref 0.0–0.5)
Eosinophils Relative: 1 %
HCT: 37 % — ABNORMAL LOW (ref 39.0–52.0)
Hemoglobin: 12.1 g/dL — ABNORMAL LOW (ref 13.0–17.0)
Immature Granulocytes: 0 %
Lymphocytes Relative: 18 %
Lymphs Abs: 1.2 10*3/uL (ref 0.7–4.0)
MCH: 30.5 pg (ref 26.0–34.0)
MCHC: 32.7 g/dL (ref 30.0–36.0)
MCV: 93.2 fL (ref 80.0–100.0)
Monocytes Absolute: 0.5 10*3/uL (ref 0.1–1.0)
Monocytes Relative: 8 %
Neutro Abs: 4.8 10*3/uL (ref 1.7–7.7)
Neutrophils Relative %: 72 %
Platelet Count: 203 10*3/uL (ref 150–400)
RBC: 3.97 MIL/uL — ABNORMAL LOW (ref 4.22–5.81)
RDW: 14.2 % (ref 11.5–15.5)
WBC Count: 6.6 10*3/uL (ref 4.0–10.5)
nRBC: 0 % (ref 0.0–0.2)

## 2023-06-10 NOTE — Assessment & Plan Note (Addendum)
 Referred by cardiologist for further evaluation of MGUS/monoclonal gammopathy.   Please review HPI above for details and timeline of events.    SPEP on 03/14/2023 showed small amount of M spike at 0.2 g/dL.  Though he has mild renal dysfunction and mild anemia, clinical picture is not concerning for amyloidosis or plasma cell neoplasm at this time.  Hemoglobin has been consistently above 10.   On his initial consultation with [place] on 04/09/2023, labs revealed hemoglobin of 12.2, hematocrit 38, MCV 92.7.  White count and platelet count were within normal limits.  Creatinine 1.42, closer to his baseline.  Calcium was not elevated and it was 9.3.  Overall unremarkable CMP otherwise.  SPEP showed 0.2 g/dL of M spike.  IFE showed a IgG kappa monoclonal protein.  Quantitative immunoglobulins were all within normal limits.  Serum free kappa was 46.1 mg/L, lambda was normal at 19.6 mg/L, ratio 2.35.     On 04/12/2023, 24-hour urine protein electrophoresis showed no M spike.  Urine IFE was unremarkable.   Overall picture is consistent with IgG kappa MGUS.  Will continue close surveillance.   Explained that 5-10% of individuals above 50 years of age may show faint protein levels without significant disease.   Cardiac MRI on 05/10/2023 showed no evidence of amyloid or infiltrative disease.  Labs today showed stable hemoglobin of 12.1, creatinine better at 1.39.  Calcium normal at 8.9.  No CRAB features.  Clinical picture remains consistent with MGUS.  Patient was provided reassurance.  Will follow-up on the myeloma labs and inform patient via MyChart.  Discussed pathogenesis of MGUS and need for continued monitoring.  RTC in 4 months for follow-up with repeat labs.  If stable labs, we will space out clinic intervals to every 6 months thereafter.

## 2023-06-10 NOTE — Progress Notes (Signed)
 [facility]  HEMATOLOGY CLINIC PROGRESS NOTE  PATIENT NAME: Donald [name]   MR#: [id number] DOB: Feb 09, 1946  Patient Care Team: Natalia Leatherwood, DO as PCP - General (Family Medicine) Corky Crafts, MD as PCP - Cardiology (Cardiology) Hillis Range, MD (Inactive) as PCP - Electrophysiology (Cardiology) Annice Pih., MD as Consulting Physician (Urology) Corky Crafts, MD as Consulting Physician (Cardiology) Berneice Heinrich Delbert Phenix., MD as Consulting Physician (Urology) Donzetta Starch, MD as Consulting Physician (Dermatology) Estrella Deeds as Physician Assistant (Cardiology)  Date of visit: 06/10/2023   ASSESSMENT & PLAN:   Donald [name] is a 78 y.o. gentleman with a past medical history of CAD s/p CABG 2001, HLD, renal cancer s/p L partial nephrectomy in December 2020, CKD 3b, PAF/atrial flutter s/p flutter ablation 2006, 2011, baseline sinus bradycardia, normocytic anemia, hypotension requiring midodrine, BPH, colon polyps, kidney stones, RBBB, chronic dizziness, was referred to our service in February 2025 for evaluation of monoclonal gammopathy.  Workup consistent with IgG Kappa MGUS.  Monoclonal gammopathy of unknown significance (MGUS) Referred by cardiologist for further evaluation of MGUS/monoclonal gammopathy.   Please review HPI above for details and timeline of events.    SPEP on 03/14/2023 showed small amount of M spike at 0.2 g/dL.  Though he has mild renal dysfunction and mild anemia, clinical picture is not concerning for amyloidosis or plasma cell neoplasm at this time.  Hemoglobin has been consistently above 10.   On his initial consultation with [place] on 04/09/2023, labs revealed hemoglobin of 12.2, hematocrit 38, MCV 92.7.  White count and platelet count were within normal limits.  Creatinine 1.42, closer to his baseline.  Calcium was not elevated and it was 9.3.  Overall unremarkable CMP otherwise.  SPEP showed 0.2 g/dL of M spike.  IFE  showed a IgG kappa monoclonal protein.  Quantitative immunoglobulins were all within normal limits.  Serum free kappa was 46.1 mg/L, lambda was normal at 19.6 mg/L, ratio 2.35.     On 04/12/2023, 24-hour urine protein electrophoresis showed no M spike.  Urine IFE was unremarkable.   Overall picture is consistent with IgG kappa MGUS.  Will continue close surveillance.   Explained that 5-10% of individuals above 45 years of age may show faint protein levels without significant disease.   Cardiac MRI on 05/10/2023 showed no evidence of amyloid or infiltrative disease.  Labs today showed stable hemoglobin of 12.1, creatinine better at 1.39.  Calcium normal at 8.9.  No CRAB features.  Clinical picture remains consistent with MGUS.  Patient was provided reassurance.  Will follow-up on the myeloma labs and inform patient via MyChart.  Discussed pathogenesis of MGUS and need for continued monitoring.  RTC in 4 months for follow-up with repeat labs.  If stable labs, we will space out clinic intervals to every 6 months thereafter.  Normocytic anemia -Chronic, stable, mild anemia, likely anemia of chronic disease related to CKD.  No hematological intervention warranted at current levels.  Will continue close monitoring.  CKD stage 3a, GFR 45-59 ml/min (HCC) Chronic kidney disease with improving function. Creatinine at 1.39 mg/dL, improved from 4.09 and 1.45 mg/dL. GFR improved from 42 to 52 mL/min/1.73 m. - Repeat kidney function tests in four months  Dizziness Intermittent dizziness, possibly related to hypotensive episodes, exacerbated by heat. Blood pressure stable at 114/70 mmHg. On amiodarone 100 mg and midodrine three times daily. - Continue amiodarone 100 mg daily - Continue midodrine three times daily - Monitor  blood pressure regularly and follow-up with cardiologist as scheduled.   I spent a total of 25 minutes during this encounter with the patient including review of chart and various  tests results, discussions about plan of care and coordination of care plan.  I reviewed lab results and outside records for this visit and discussed relevant results with the patient. Diagnosis, plan of care and treatment options were also discussed in detail with the patient. Opportunity provided to ask questions and answers provided to his apparent satisfaction. Provided instructions to call our clinic with any problems, questions or concerns prior to return visit. I recommended to continue follow-up with PCP and sub-specialists. He verbalized understanding and agreed with the plan. No barriers to learning was detected.  Donald Crutch, MD  06/10/2023 11:54 AM  [facility] - A DEPT OF [facility] [address] [place] [address] Dept: [phone] Dept Fax: [phone]   CHIEF COMPLAINT/ REASON FOR VISIT:  Follow-up for IgG kappa MGUS.  INTERVAL HISTORY:  Discussed the use of AI scribe software for clinical note transcription with the patient, who gave verbal consent to proceed.   History of Present Illness He experiences intermittent dizziness, particularly in hot weather, which he associates with low blood pressure. The dizziness has improved over the last few days, occurring about three times a week. During these episodes, his blood pressure drops to around 100 mmHg. He is currently taking midodrine three times a day for low blood pressure. Recently, his amiodarone dosage was reduced from 200 mg to 100 mg by his cardiologist.  His kidney function has shown improvement, with a current creatinine level of 1.39 mg/dL, down from previous levels of 1.65 and 1.45 mg/dL. The glomerular filtration rate has improved from 42 to 52.  He has stable hemoglobin levels at 12.1 g/dL, similar to previous measurements. His white blood cell and platelet counts are normal. No issues with bowel movements, blood in stools, or black  stools.  He mentions a slight weight loss, having dropped a couple of pounds unexpectedly. He describes his food intake as 'very little' and acknowledges that he does not eat a lot.   SUMMARY OF HEMATOLOGIC HISTORY:  He was referred by his cardiologist due to a blood test result that raised suspicion for amyloidosis. The patient reports experiencing dizziness, which has been a concern for his cardiologists. The patient's heart rhythm has been irregular at times, but no medications are currently being taken that would slow the heart rate. The patient had a kidney tumor removed in 2020 and has had no further treatments since the surgery. The patient's kidney function has been slightly low since the surgery, but it is not a cause for concern at this time. The patient's blood pressure tends to run low, and he is on midodrine to help manage this. The patient's potassium levels have been slightly high on a few occasions, but the levels were normal at the last check.   Because of dizziness, his cardiologist pursued workup to rule out amyloidosis.  The PYP scan was read as equivocal meaning neither yes nor no for cardiac amyloidosis. Dr. Izora Ribas thinks it is likely negative. However, given the clinical history cardiac MRI was suggested and he is scheduled for cardiac MRI on 05/10/2023.   As part of workup, on 03/14/2023, serum protein electrophoresis and random urine protein electrophoresis were obtained.  SPEP showed 0.2 g/dL of M spike.  IFE was not obtained.  Urine  protein electrophoresis was unremarkable without evidence of M spike.  CBC showed hemoglobin of 12.7, MCV 94.  Normal white count and platelet count.  CMP showed creatinine of 1.45, closer to his baseline.  Calcium was normal at 9.4.   He was referred to [place] for further evaluation of MGUS/monoclonal gammopathy.   He denies fever, cough, diarrhea, or other infectious symptoms.  He denies epistaxis, bloody stool, melena, hematuria, bruising or  other bleeding symptoms. He also denies unintentional weight loss, night sweats or other constitutional symptoms.  SPEP on 03/14/2023 showed small amount of M spike at 0.2 g/dL.  Though he has mild renal dysfunction and mild anemia, clinical picture is not concerning for amyloidosis or plasma cell neoplasm at this time.  Hemoglobin has been consistently above 10.    On his initial consultation with [place] on 04/09/2023, labs revealed hemoglobin of 12.2, hematocrit 38, MCV 92.7.  White count and platelet count were within normal limits.  Creatinine 1.42, closer to his baseline.  Calcium was not elevated and it was 9.3.  Overall unremarkable CMP otherwise.  SPEP showed 0.2 g/dL of M spike.  IFE showed a IgG kappa monoclonal protein.  Quantitative immunoglobulins were all within normal limits.  Serum free kappa was 46.1 mg/L, lambda was normal at 19.6 mg/L, ratio 2.35.    On 04/12/2023, 24-hour urine protein electrophoresis showed no M spike.  Urine IFE was unremarkable.  Overall picture is consistent with IgG kappa MGUS.  Will continue close surveillance.   Explained that 5-10% of individuals above 20 years of age may show faint protein levels without significant disease.   Discussed pathogenesis of MGUS/monoclonal gammopathy and need for continued monitoring.  Patient was provided reassurance.   Tentative follow-up appointment scheduled in approximately 8 weeks to follow-up on results from cardiac MRI and additional workup will be pursued as needed.  I have reviewed the past medical history, past surgical history, social history and family history with the patient and they are unchanged from previous note.  ALLERGIES: He has no known allergies.  MEDICATIONS:  Current Outpatient Medications  Medication Sig Dispense Refill   acetaminophen (TYLENOL) 500 MG tablet Take by mouth.     amiodarone (PACERONE) 100 MG tablet Take 1 tablet (100 mg total) by mouth daily. 90 tablet 1   apixaban (ELIQUIS) 5 MG TABS  tablet Take 1 tablet by mouth twice daily 60 tablet 5   atorvastatin (LIPITOR) 40 MG tablet Take 40 mg by mouth daily.     cephALEXin (KEFLEX) 500 MG capsule Take by mouth as needed. Prior to dental appointments     co-enzyme Q-10 30 MG capsule Take 30 mg by mouth daily.     midodrine (PROAMATINE) 5 MG tablet Take 1 tablet (5 mg total) by mouth 3 (three) times daily with meals. 270 tablet 3   Multiple Vitamin (MULTIVITAMIN ADULT PO) Take 1 tablet by mouth daily.     Zinc 50 MG TABS Take 1 tablet by mouth daily at 12 noon.     No current facility-administered medications for this visit.     REVIEW OF SYSTEMS:    ROS  All other pertinent systems were reviewed with the patient and are negative.  PHYSICAL EXAMINATION:    Onc Performance Status - 06/10/23 1117       ECOG Perf Status   ECOG Perf Status Restricted in physically strenuous activity but ambulatory and able to carry out work of a light or sedentary nature, e.g., light house work, office work  KPS SCALE   KPS % SCORE Able to carry on normal activity, minor s/s of disease              Vitals:   06/10/23 1111  BP: 114/70  Pulse: (!) 55  Resp: 18  Temp: 98 F (36.7 C)  SpO2: 98%    Filed Weights   06/10/23 1111  Weight: 155 lb 6.4 oz (70.5 kg)     Physical Exam Constitutional:      General: He is not in acute distress.    Appearance: Normal appearance.  HENT:     Head: Normocephalic and atraumatic.  Eyes:     General: No scleral icterus.    Conjunctiva/sclera: Conjunctivae normal.  Cardiovascular:     Rate and Rhythm: Normal rate and regular rhythm.     Heart sounds: Normal heart sounds.  Pulmonary:     Effort: Pulmonary effort is normal.     Breath sounds: Normal breath sounds.  Abdominal:     General: There is no distension.  Musculoskeletal:     Right lower leg: No edema.     Left lower leg: No edema.  Neurological:     General: No focal deficit present.     Mental Status: He is alert  and oriented to person, place, and time.  Psychiatric:        Mood and Affect: Mood normal.        Behavior: Behavior normal.        Thought Content: Thought content normal.     LABORATORY DATA:   I have reviewed the data as listed.  Recent Results (from the past 2160 hours)  CBC     Status: Abnormal   Collection Time: 03/14/23  9:49 AM  Result Value Ref Range   WBC 7.6 3.4 - 10.8 x10E3/uL   RBC 4.15 4.14 - 5.80 x10E6/uL   Hemoglobin 12.7 (L) 13.0 - 17.7 g/dL   Hematocrit 16.1 09.6 - 51.0 %   MCV 94 79 - 97 fL   MCH 30.6 26.6 - 33.0 pg   MCHC 32.6 31.5 - 35.7 g/dL   RDW 04.5 40.9 - 81.1 %   Platelets 234 150 - 450 x10E3/uL  Comprehensive metabolic panel     Status: Abnormal   Collection Time: 03/14/23  9:49 AM  Result Value Ref Range   Glucose 108 (H) 70 - 99 mg/dL   BUN 29 (H) 8 - 27 mg/dL   Creatinine, Ser 9.14 (H) 0.76 - 1.27 mg/dL   eGFR 49 (L) >78 GN/FAO/1.30   BUN/Creatinine Ratio 20 10 - 24   Sodium 144 134 - 144 mmol/L   Potassium 5.4 (H) 3.5 - 5.2 mmol/L   Chloride 107 (H) 96 - 106 mmol/L   CO2 24 20 - 29 mmol/L   Calcium 9.4 8.6 - 10.2 mg/dL   Total Protein 6.3 6.0 - 8.5 g/dL   Albumin 3.9 3.8 - 4.8 g/dL   Globulin, Total 2.4 1.5 - 4.5 g/dL   Bilirubin Total 0.5 0.0 - 1.2 mg/dL   Alkaline Phosphatase 94 44 - 121 IU/L   AST 22 0 - 40 IU/L   ALT 23 0 - 44 IU/L  Protein Electrophoresis, (serum)     Status: Abnormal   Collection Time: 03/14/23  9:49 AM  Result Value Ref Range   Albumin ELP 3.5 2.9 - 4.4 g/dL   Alpha 1 0.2 0.0 - 0.4 g/dL   Alpha 2 0.8 0.4 - 1.0 g/dL   Beta 0.9 0.7 -  1.3 g/dL   Gamma Globulin 0.9 0.4 - 1.8 g/dL   M-Spike, % 0.2 (H) Not Observed g/dL   Globulin, Total 2.8 2.2 - 3.9 g/dL   A/G Ratio 1.3 0.7 - 1.7   Please Note: Comment     Comment: Protein electrophoresis scan will follow via computer, mail, or courier delivery.    Interpretation: Comment     Comment: Faint band in gamma region suspicious for monoclonal  immunoglobulin. This band may represent a benign spike as seen in older people or could be a paraprotein as seen in Multiple Myeloma, Waldenstrom's Macroglobulinemia or Lymphoma. Depending on clinical circumstances, further diagnostic studies may include serum immunofixation or serum free light chain quantitation.   Protein Electrophoresis,Random Urn     Status: None   Collection Time: 03/14/23  9:49 AM  Result Value Ref Range   Creatinine, Urine 223.8 Not Estab. mg/dL   Protein, Ur 16.1 Not Estab. mg/dL   Albumin ELP, Urine 096.0 %   Alpha-1-Globulin, U 0.0 %   Alpha-2-Globulin, U 0.0 %   Beta Globulin, U 0.0 %   Gamma Globulin, U 0.0 %   M Component, Ur Not Observed Not Observed %   Please Note: Comment     Comment: Protein electrophoresis scan will follow via computer, mail, or courier delivery.   Potassium     Status: None   Collection Time: 03/18/23  8:25 AM  Result Value Ref Range   Potassium 4.3 3.5 - 5.1 mEq/L  PYP Scan     Status: None   Collection Time: 03/25/23  3:34 PM  Result Value Ref Range   H/CL Ratio 1.25   Uric acid     Status: None   Collection Time: 04/09/23 10:15 AM  Result Value Ref Range   Uric Acid, Serum 5.2 3.7 - 8.6 mg/dL    Comment: Performed at [facility], [address] [place], [place] [address]  Beta 2 microglobulin, serum     Status: None   Collection Time: 04/09/23 10:15 AM  Result Value Ref Range   Beta-2 Microglobulin 2.3 0.6 - 2.4 mg/L    Comment: (NOTE) Siemens Immulite 2000 Immunochemiluminometric assay (ICMA) Values obtained with different assay methods or kits cannot be used interchangeably. Results cannot be interpreted as absolute evidence of the presence or absence of malignant disease. Performed At: [facility] [address] [place], [place] [id number] Jolene Schimke MD [id number]   Kappa/lambda light chains     Status: Abnormal   Collection Time: 04/09/23 10:15 AM  Result Value Ref  Range   Kappa free light chain 46.1 (H) 3.3 - 19.4 mg/L   Lambda free light chains 19.6 5.7 - 26.3 mg/L   Kappa, lambda light chain ratio 2.35 (H) 0.26 - 1.65    Comment: (NOTE) Performed At: [facility] [address] [place], [place] [id number] Jolene Schimke MD UX:[id number]   Lactate dehydrogenase     Status: None   Collection Time: 04/09/23 10:15 AM  Result Value Ref Range   LDH 159 98 - 192 U/L    Comment: Performed at [facility], [address] [place], [place] [address]  Comprehensive metabolic panel     Status: Abnormal   Collection Time: 04/09/23 10:15 AM  Result Value Ref Range   Sodium 140 135 - 145 mmol/L   Potassium 5.0 3.5 - 5.1 mmol/L   Chloride 106 98 - 111 mmol/L   CO2 29 22 - 32 mmol/L   Glucose, Bld 86 70 -  99 mg/dL    Comment: Glucose reference range applies only to samples taken after fasting for at least 8 hours.   BUN 27 (H) 8 - 23 mg/dL   Creatinine, Ser 7.84 (H) 0.61 - 1.24 mg/dL   Calcium 9.3 8.9 - 69.6 mg/dL   Total Protein 6.7 6.5 - 8.1 g/dL   Albumin 3.8 3.5 - 5.0 g/dL   AST 21 15 - 41 U/L   ALT 21 0 - 44 U/L   Alkaline Phosphatase 73 38 - 126 U/L   Total Bilirubin 0.5 0.0 - 1.2 mg/dL   GFR, Estimated 50 (L) >60 mL/min    Comment: (NOTE) Calculated using the CKD-EPI Creatinine Equation (2021)    Anion gap 5 5 - 15    Comment: Performed at [facility], [address] [place], [place] [address]  CBC with Differential/Platelet     Status: Abnormal   Collection Time: 04/09/23 10:15 AM  Result Value Ref Range   WBC 7.0 4.0 - 10.5 K/uL   RBC 4.10 (L) 4.22 - 5.81 MIL/uL   Hemoglobin 12.2 (L) 13.0 - 17.0 g/dL   HCT 41.3 (L) 24.4 - 01.0 %   MCV 92.7 80.0 - 100.0 fL   MCH 29.8 26.0 - 34.0 pg   MCHC 32.1 30.0 - 36.0 g/dL   RDW 27.2 53.6 - 64.4 %   Platelets 219 150 - 400 K/uL   nRBC 0.0 0.0 - 0.2 %   Neutrophils Relative % 69 %   Neutro Abs 4.9 1.7 - 7.7 K/uL   Lymphocytes Relative 19 %    Lymphs Abs 1.3 0.7 - 4.0 K/uL   Monocytes Relative 9 %   Monocytes Absolute 0.6 0.1 - 1.0 K/uL   Eosinophils Relative 2 %   Eosinophils Absolute 0.2 0.0 - 0.5 K/uL   Basophils Relative 1 %   Basophils Absolute 0.1 0.0 - 0.1 K/uL   Immature Granulocytes 0 %   Abs Immature Granulocytes 0.01 0.00 - 0.07 K/uL    Comment: Performed at [facility], [address] [place], [place] [address]  Multiple Myeloma Panel (SPEP&IFE w/QIG)     Status: Abnormal   Collection Time: 04/09/23 10:16 AM  Result Value Ref Range   IgG (Immunoglobin G), Serum 1,006 603 - 1,613 mg/dL   IgA 259 61 - 563 mg/dL   IgM (Immunoglobulin M), Srm 55 15 - 143 mg/dL   Total Protein ELP 6.3 6.0 - 8.5 g/dL   Albumin SerPl Elph-Mcnc 3.6 2.9 - 4.4 g/dL   Alpha 1 0.2 0.0 - 0.4 g/dL   Alpha2 Glob SerPl Elph-Mcnc 0.7 0.4 - 1.0 g/dL   B-Globulin SerPl Elph-Mcnc 0.9 0.7 - 1.3 g/dL   Gamma Glob SerPl Elph-Mcnc 0.9 0.4 - 1.8 g/dL   M Protein SerPl Elph-Mcnc 0.2 (H) Not Observed g/dL   Globulin, Total 2.7 2.2 - 3.9 g/dL   Albumin/Glob SerPl 1.4 0.7 - 1.7   IFE 1 Comment (A)     Comment: (NOTE) Immunofixation shows IgG monoclonal protein with kappa light chain specificity. PLEASE NOTE: Samples from patients receiving DARZALEX(R) (daratumumab) or SARCLISA(R)(isatuximab-irfc) treatment can appear as an "IgG kappa" and mask a complete response (CR). If this patient is receiving these therapies, this IFE assay interference can be removed by ordering test number 123218-"Immunofixation, Daratumumab-Specific, Serum" or 123062-"Immunofixation, Isatuximab-Specific, Serum" and submitting a new sample for testing or by calling the lab to add this test to the current sample. Monoclonal bands detected are faint. Suggest retesting in 4-6  months.    Please Note Comment     Comment: (NOTE) Protein electrophoresis scan will follow via computer, mail, or courier delivery. Performed At: [facility] [address] [place], [place] [id number] Jolene Schimke MD [id number]   UPEP/UIFE/Light Chains/TP, 24-Hr Ur     Status: None   Collection Time: 04/12/23 10:13 AM  Result Value Ref Range   Total Protein, Urine 4.6 Not Estab. mg/dL   Total Protein, Urine-Ur/day 87 30 - 150 mg/24 hr   Albumin, U 100.0 %   ALPHA 1 URINE 0.0 %   Alpha 2, Urine 0.0 %   % BETA, Urine 0.0 %   GAMMA GLOBULIN URINE 0.0 %   Free Kappa Lt Chains,Ur 24.24 1.17 - 86.46 mg/L   Free Lambda Lt Chains,Ur 5.62 0.27 - 15.21 mg/L   Free Kappa/Lambda Ratio 4.31 1.83 - 14.26    Comment: (NOTE) Performed At: [facility] [address] [place], [place] [id number] Jolene Schimke MD [id number]    Immunofixation Result, Urine Comment     Comment: (NOTE) The immunofixation pattern appears unremarkable. Evidence of monoclonal protein is not apparent.    Total Volume 1,900     Comment: Performed at [facility], [address] [place], [place] [address]   M-SPIKE %, Urine Not Observed Not Observed %   Note: Comment     Comment: (NOTE) Protein electrophoresis scan will follow via computer, mail, or courier delivery.   CMP (Cancer Center only)     Status: Abnormal   Collection Time: 06/10/23 10:40 AM  Result Value Ref Range   Sodium 141 135 - 145 mmol/L   Potassium 4.5 3.5 - 5.1 mmol/L   Chloride 107 98 - 111 mmol/L   CO2 28 22 - 32 mmol/L   Glucose, Bld 100 (H) 70 - 99 mg/dL    Comment: Glucose reference range applies only to samples taken after fasting for at least 8 hours.   BUN 26 (H) 8 - 23 mg/dL   Creatinine 6.64 (H) 4.03 - 1.24 mg/dL   Calcium 8.9 8.9 - 47.4 mg/dL   Total Protein 6.6 6.5 - 8.1 g/dL   Albumin 4.0 3.5 - 5.0 g/dL   AST 20 15 - 41 U/L   ALT 20 0 - 44 U/L   Alkaline Phosphatase 63 38 - 126 U/L   Total Bilirubin 0.6 0.0 - 1.2 mg/dL   GFR, Estimated 52 (L) >60 mL/min    Comment: (NOTE) Calculated using the CKD-EPI Creatinine Equation (2021)    Anion gap 6 5 - 15     Comment: Performed at [facility], [address] [place], [place] [address]  CBC with Differential (Cancer Center Only)     Status: Abnormal   Collection Time: 06/10/23 10:40 AM  Result Value Ref Range   WBC Count 6.6 4.0 - 10.5 K/uL   RBC 3.97 (L) 4.22 - 5.81 MIL/uL   Hemoglobin 12.1 (L) 13.0 - 17.0 g/dL   HCT 38.7 (L) 56.4 - 33.2 %   MCV 93.2 80.0 - 100.0 fL   MCH 30.5 26.0 - 34.0 pg   MCHC 32.7 30.0 - 36.0 g/dL   RDW 95.1 88.4 - 16.6 %   Platelet Count 203 150 - 400 K/uL   nRBC 0.0 0.0 - 0.2 %   Neutrophils Relative % 72 %   Neutro Abs 4.8 1.7 - 7.7 K/uL   Lymphocytes Relative 18 %   Lymphs Abs 1.2 0.7 - 4.0 K/uL   Monocytes  Relative 8 %   Monocytes Absolute 0.5 0.1 - 1.0 K/uL   Eosinophils Relative 1 %   Eosinophils Absolute 0.1 0.0 - 0.5 K/uL   Basophils Relative 1 %   Basophils Absolute 0.0 0.0 - 0.1 K/uL   Immature Granulocytes 0 %   Abs Immature Granulocytes 0.01 0.00 - 0.07 K/uL    Comment: Performed at [facility], [address], [place], [place] [address]     RADIOGRAPHIC STUDIES:  I have personally reviewed the radiology reports:  05/10/2023- Cardiac MRI  CLINICAL DATA:  R/O Amyloid Prior CABG   EXAM: CARDIAC MRI   TECHNIQUE: The patient was scanned on a 1.5 Tesla Siemens magnet. A dedicated cardiac coil was used. Functional imaging was done using Fiesta sequences. 2,3, and 4 chamber views were done to assess for RWMA's. Modified Simpson's rule using a short axis stack was used to calculate an ejection fraction on a dedicated work [employer]. The patient received 7.5 cc of Gadavist. After 10 minutes inversion recovery sequences were used to assess for infiltration and scar tissue.   CONTRAST:  Gadavist   FINDINGS: Normal atrial size. No ASD/PFO. No LAA thrombus. Normal cardiac valves with tri leaflet AV. Normal ascending thoracic aorta 3.0 cm. No pericardial effusion   Quantitative LVEF  55% (EDV 154 cc ESV 69 cc SV 85 cc) Estimated cardiac output 4.7 L/min   Quantitative RVEF 54% (EDV 160 cc ESV 73 cc SV 87 cc )   Delayed enhancement images with gadolinium showed normal nulling of the myocardium with no uptake, scar, infiltration or infarct   Parametric measures: Using Hct 38   T1: Normal 1000 msec   ECV Normal 22%   T2 Normal 50 msec   IMPRESSION: 1.  Normal LV size and function LVEF 55%   2.  No evidence of amyloid   3.  No delayed gadolinium uptake normal myocardial nulling   4.  Normal RV size and function RVEF 54%   5.  Normal cardiac valves   6.  Normal parametric measures see values above   7.  Estimated cardiac output using flow analysis 4.7 L/min   Charlton Haws     Electronically Signed   By: Charlton Haws M.D.   On: 05/10/2023 12:30     Orders Placed This Encounter  Procedures   CMP (Cancer Center only)    Standing Status:   Future    Expected Date:   10/10/2023    Expiration Date:   06/09/2024   CBC with Differential (Cancer Center Only)    Standing Status:   Future    Expected Date:   10/10/2023    Expiration Date:   06/09/2024   Multiple Myeloma Panel (SPEP&IFE w/QIG)    Standing Status:   Future    Expected Date:   10/10/2023    Expiration Date:   06/09/2024   Kappa/lambda light chains    Standing Status:   Future    Expected Date:   10/10/2023    Expiration Date:   06/09/2024   Lactate dehydrogenase    Standing Status:   Future    Expected Date:   10/10/2023    Expiration Date:   06/09/2024     Future Appointments  Date Time Provider Department Center  06/11/2023  9:20 AM Natalia Leatherwood, DO [facility]  06/27/2023 12:30 PM [facility] VASC 3 [facility]  07/17/2023  3:50 PM [facility] ANNUAL WELLNESS VISIT [facility]  08/12/2023 11:15 AM Geni Bers,  Tarri Abernethy, PA-C [facility]    This document was completed utilizing speech recognition software. Grammatical errors, random word insertions, pronoun errors, and incomplete  sentences are an occasional consequence of this system due to software limitations, ambient noise, and hardware issues. Any formal questions or concerns about the content, text or information contained within the body of this dictation should be directly addressed to the provider for clarification.

## 2023-06-10 NOTE — Assessment & Plan Note (Signed)
 Intermittent dizziness, possibly related to hypotensive episodes, exacerbated by heat. Blood pressure stable at 114/70 mmHg. On amiodarone 100 mg and midodrine three times daily. - Continue amiodarone 100 mg daily - Continue midodrine three times daily - Monitor blood pressure regularly and follow-up with cardiologist as scheduled.

## 2023-06-10 NOTE — Assessment & Plan Note (Signed)
 Chronic kidney disease with improving function. Creatinine at 1.39 mg/dL, improved from 2.95 and 1.45 mg/dL. GFR improved from 42 to 52 mL/min/1.73 m. - Repeat kidney function tests in four months

## 2023-06-10 NOTE — Assessment & Plan Note (Signed)
-  Chronic, stable, mild anemia, likely anemia of chronic disease related to CKD.  No hematological intervention warranted at current levels.  Will continue close monitoring.

## 2023-06-11 ENCOUNTER — Ambulatory Visit (INDEPENDENT_AMBULATORY_CARE_PROVIDER_SITE_OTHER): Admitting: Family Medicine

## 2023-06-11 ENCOUNTER — Encounter: Payer: Self-pay | Admitting: Family Medicine

## 2023-06-11 ENCOUNTER — Telehealth: Payer: Self-pay | Admitting: Oncology

## 2023-06-11 VITALS — BP 122/64 | HR 57 | Temp 98.1°F | Wt 157.0 lb

## 2023-06-11 DIAGNOSIS — K4090 Unilateral inguinal hernia, without obstruction or gangrene, not specified as recurrent: Secondary | ICD-10-CM | POA: Insufficient documentation

## 2023-06-11 DIAGNOSIS — R1032 Left lower quadrant pain: Secondary | ICD-10-CM | POA: Diagnosis not present

## 2023-06-11 LAB — KAPPA/LAMBDA LIGHT CHAINS
Kappa free light chain: 41.8 mg/L — ABNORMAL HIGH (ref 3.3–19.4)
Kappa, lambda light chain ratio: 2.38 — ABNORMAL HIGH (ref 0.26–1.65)
Lambda free light chains: 17.6 mg/L (ref 5.7–26.3)

## 2023-06-11 NOTE — Telephone Encounter (Signed)
 Patient has been scheduled. Aware of appt date and time.

## 2023-06-11 NOTE — Telephone Encounter (Signed)
 Contacted pt to schedule an appt per 06/10/23 los. Unable to reach via phone, voicemail was left.    Follow-up disposition: Return in about 4 months (around 10/10/2023) for Lab with 15 min visit.

## 2023-06-11 NOTE — Progress Notes (Signed)
 Donald [name] , Oct 11, 1945, 78 y.o., male MRN: [id number] Patient Care Team    Relationship Specialty Notifications Start End  Natalia Leatherwood, DO PCP - General Family Medicine  12/11/18   Corky Crafts, MD PCP - Cardiology Cardiology Admissions 11/25/18   Hillis Range, MD (Inactive) PCP - Electrophysiology Cardiology Admissions 11/25/18   Annice Pih., MD Consulting Physician Urology  09/09/14   Corky Crafts, MD Consulting Physician Cardiology  09/09/14   Loletta Parish., MD Consulting Physician Urology  01/12/19   Donzetta Starch, MD Consulting Physician Dermatology  01/12/19   Laurann Montana, PA-C Physician Assistant Cardiology  04/09/23     Chief Complaint  Patient presents with   Abdominal Pain    A few weeks, LLQ; Pt can feel a "lump" in the area. Has concerns of a hernia. No other associated symptoms. Pt has not taken anything to relieve symptoms.      Subjective: Donald [name] is a 78 y.o. Pt presents for an OV with complaints of left lower quadrant abdominal discomfort of 5 days duration, concerning for a hernia-he would like to confirm.  Patient reports he was watching his urine about 10 days ago.  He noticed some lower back pain which is normal for him, but over the weekend he endorsed having left lower quadrant pain.  He states he then noticed that there was a lump in his left suprapubic/inguinal area. He denies any fevers or chills.  He denies any bowel habit changes or pain with bowel movements.  He denies any pain radiating to scrotal area. Pt has tried nothing to ease their symptoms.      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 MI 2003 with CABG.   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    Osteoarthritis of knee 05/13/2017   Renal cell carcinoma (HCC) 02/04/2019   Partial nephrectomyKIDNEY, LEFT, PARTIAL NEPHRECTOMY:    Right ureteral stone  S/P ablation of atrial flutter    followed by dr Johney Frame (EP cardiolgoist)  ablation 04-17-2004  and by dr allred 02-10- 2011 was successful   S/P CABG x 5 08-05-2001   in [place] , [place]   LIMA to LAD,  seqVG to Diagonal,  SeqVG to OM,  seqVG to PDA and PLA   Seasonal allergies    Sinus bradycardia    Wears glasses    Past Surgical History:  Procedure Laterality Date   CARDIAC CATHETERIZATION  08-04-2001  in [place], [place]   severe 3 vessel cad   CARDIAC ELECTROPHYSIOLOGY MAPPING AND ABLATION  x2  04-17-2004 [place], [place]);  04-14-2009 by dr Johney Frame   per dr allred report-- successful ablation clockwise isthmus-dependent right atrial flutter along the usual cavotricupid isthmus;  complete bidirectionl isthmus block achieved   CORONARY ARTERY BYPASS GRAFT  08-06-2003  in [place], [place]   x5-- LIMA - LAD,  seqVG to Diagonal, OM, PDA, and PLA   CYSTOSCOPY WITH RETROGRADE PYELOGRAM, URETEROSCOPY AND STENT PLACEMENT Right  08/10/2016   Procedure: CYSTOSCOPY WITH RETROGRADE PYELOGRAM, URETEROSCOPY AND STENT PLACEMENT;  Surgeon: Sebastian Ache, MD;  Location: [facility];  Service: Urology;  Laterality: Right;   FIXATION RIGHT WRIST Right 2011   stability for arthritis   INGUINAL HERNIA REPAIR Right 08-08-2015   [facility]- health care at [facility]   ROBOTIC ASSITED PARTIAL NEPHRECTOMY Left 02/04/2019   Procedure: XI ROBOTIC ASSITED LEFT PARTIAL NEPHRECTOMY; LEFT PARTIAL ADRENALECTOMY;  Surgeon: Sebastian Ache, MD;  Location: [facility];  Service: Urology;  Laterality: Left;  3 HRS   ROTATOR CUFF REPAIR Right 2004;  2007   TOTAL KNEE ARTHROPLASTY Left 11/30/2020   Procedure: TOTAL KNEE ARTHROPLASTY;  Surgeon: Jene Every, MD;  Location: [facility];  Service: Orthopedics;  Laterality: Left;   WISDOM TOOTH EXTRACTION     Family History  Problem Relation Age of Onset   Heart attack Mother    Alzheimer's disease Mother    Cancer Father        throat cancer, chewing tobacco   Hypertension Neg Hx    Allergies as of 06/11/2023   No Known Allergies      Medication List        Accurate as of June 11, 2023  9:42 AM. If you have any questions, ask your nurse or doctor.          acetaminophen 500 MG tablet Commonly known as: TYLENOL Take by mouth.   amiodarone 100 MG tablet Commonly known as: PACERONE Take 1 tablet (100 mg total) by mouth daily.   atorvastatin 40 MG tablet Commonly known as: LIPITOR Take 40 mg by mouth daily.   cephALEXin 500 MG capsule Commonly known as: KEFLEX Take by mouth as needed. Prior to dental appointments   co-enzyme Q-10 30 MG capsule Take 30 mg by mouth daily.   Eliquis 5 MG Tabs tablet Generic drug: apixaban Take 1 tablet by mouth twice daily   midodrine 5 MG tablet Commonly known as: PROAMATINE Take 1 tablet (5 mg total) by mouth 3 (three) times daily with meals.   MULTIVITAMIN ADULT PO Take 1 tablet by mouth daily.   Zinc 50 MG Tabs Take  1 tablet by mouth daily at 12 noon.        All past medical history, surgical history, allergies, family history, immunizations andmedications were updated in the EMR today and reviewed under the history and medication portions of their EMR.     ROS  Negative, with the exception of above mentioned in HPI   Objective:  BP 122/64   Pulse (!) 57   Temp 98.1 F (36.7 C)   Wt 157 lb (71.2 kg)   SpO2 96%   BMI 26.13 kg/m  Body mass index is 26.13 kg/m. Physical Exam Vitals and nursing note reviewed.  Constitutional:      General: He is not in acute distress.    Appearance: Normal appearance. He is not ill-appearing, toxic-appearing or diaphoretic.  HENT:     Head: Normocephalic and atraumatic.  Eyes:     General: No scleral icterus.       Right eye: No discharge.        Left eye: No discharge.     Extraocular Movements: Extraocular movements intact.     Pupils: Pupils are equal, round, and reactive to light.  Abdominal:     General: Abdomen is flat. Bowel sounds are normal. There is no distension.     Palpations: Abdomen is soft. There is mass.     Tenderness: There is abdominal tenderness in the left lower quadrant.     Hernia: A hernia is present. Hernia is present in the left inguinal area (reducible).  Genitourinary:    Testes:        Right: Mass, tenderness or swelling not present.        Left: Mass, tenderness or swelling not present.  Skin:    General: Skin is warm and dry.     Coloration: Skin is not jaundiced or pale.     Findings: No rash.  Neurological:     Mental Status: He is alert and oriented to person, place, and time. Mental status is at baseline.  Psychiatric:        Mood and Affect: Mood normal.        Behavior: Behavior normal.        Thought Content: Thought content normal.        Judgment: Judgment normal.      No results found. No results found. Results for orders placed or performed in visit on 06/10/23 (from the past 24 hours)  CMP  (Cancer Center only)     Status: Abnormal   Collection Time: 06/10/23 10:40 AM  Result Value Ref Range   Sodium 141 135 - 145 mmol/L   Potassium 4.5 3.5 - 5.1 mmol/L   Chloride 107 98 - 111 mmol/L   CO2 28 22 - 32 mmol/L   Glucose, Bld 100 (H) 70 - 99 mg/dL   BUN 26 (H) 8 - 23 mg/dL   Creatinine 1.61 (H) 0.96 - 1.24 mg/dL   Calcium 8.9 8.9 - 04.5 mg/dL   Total Protein 6.6 6.5 - 8.1 g/dL   Albumin 4.0 3.5 - 5.0 g/dL   AST 20 15 - 41 U/L   ALT 20 0 - 44 U/L   Alkaline Phosphatase 63 38 - 126 U/L   Total Bilirubin 0.6 0.0 - 1.2 mg/dL   GFR, Estimated 52 (L) >60 mL/min   Anion gap 6 5 - 15  CBC with Differential (Cancer Center Only)     Status: Abnormal   Collection Time: 06/10/23 10:40 AM  Result Value Ref Range   WBC Count 6.6 4.0 - 10.5 K/uL   RBC 3.97 (L) 4.22 - 5.81 MIL/uL   Hemoglobin 12.1 (L) 13.0 - 17.0 g/dL   HCT 40.9 (L) 81.1 - 91.4 %   MCV 93.2 80.0 - 100.0 fL   MCH 30.5 26.0 -  34.0 pg   MCHC 32.7 30.0 - 36.0 g/dL   RDW 78.2 95.6 - 21.3 %   Platelet Count 203 150 - 400 K/uL   nRBC 0.0 0.0 - 0.2 %   Neutrophils Relative % 72 %   Neutro Abs 4.8 1.7 - 7.7 K/uL   Lymphocytes Relative 18 %   Lymphs Abs 1.2 0.7 - 4.0 K/uL   Monocytes Relative 8 %   Monocytes Absolute 0.5 0.1 - 1.0 K/uL   Eosinophils Relative 1 %   Eosinophils Absolute 0.1 0.0 - 0.5 K/uL   Basophils Relative 1 %   Basophils Absolute 0.0 0.0 - 0.1 K/uL   Immature Granulocytes 0 %   Abs Immature Granulocytes 0.01 0.00 - 0.07 K/uL    Assessment/Plan: Donald [name] is a 78 y.o. male present for OV for  LLQ pain (Primary)/ Left inguinal hernia New inguinal hernia likely.  - Ambulatory referral to General Surgery - CT ABDOMEN PELVIS W CONTRAST; Future - dull pain present with new left inguinal mass, presumed to be inguinal hernia. Low suspicion for colon entrapment, reducible appearing.  Will obtain CT and refer to gen surg for further eval and tx plan We briefly discussed keeping stools formed but  soft, no lifting/straining for now.  We discussed emergent precautions.  He had recent BMP yesterday  Reviewed expectations re: course of current medical issues. Discussed self-management of symptoms. Outlined signs and symptoms indicating need for more acute intervention. Patient verbalized understanding and all questions were answered. Patient received an After-Visit Summary.    Orders Placed This Encounter  Procedures   CT ABDOMEN PELVIS W CONTRAST   Ambulatory referral to General Surgery   No orders of the defined types were placed in this encounter.  Referral Orders         Ambulatory referral to General Surgery       Note is dictated utilizing voice recognition software. Although note has been proof read prior to signing, occasional typographical errors still can be missed. If any questions arise, please do not hesitate to call for verification.   electronically signed by:  Felix Pacini, DO  [facility] - [facility]

## 2023-06-11 NOTE — Patient Instructions (Addendum)
   Great to see you today.  I have refilled the medication(s) we provide.   If labs were collected or images ordered, we will inform you of  results once we have received them and reviewed. We will contact you either by echart message, or telephone call.  Please give ample time to the testing facility, and our office to run,  receive and review results. Please do not call inquiring of results, even if you can see them in your chart. We will contact you as soon as we are able. If it has been over 1 week since the test was completed, and you have not yet heard from [place], then please call us.    - echart message- for normal results that have been seen by the patient already.   - telephone call: abnormal results or if patient has not viewed results in their echart.  If a referral to a specialist was entered for you, please call us in 2 weeks if you have not heard from the specialist office to schedule.

## 2023-06-13 ENCOUNTER — Other Ambulatory Visit (HOSPITAL_COMMUNITY): Payer: Self-pay

## 2023-06-13 ENCOUNTER — Telehealth: Payer: Self-pay | Admitting: Physician Assistant

## 2023-06-13 ENCOUNTER — Telehealth: Payer: Self-pay | Admitting: Pharmacy Technician

## 2023-06-13 LAB — MULTIPLE MYELOMA PANEL, SERUM
Albumin SerPl Elph-Mcnc: 3.5 g/dL (ref 2.9–4.4)
Albumin/Glob SerPl: 1.5 (ref 0.7–1.7)
Alpha 1: 0.2 g/dL (ref 0.0–0.4)
Alpha2 Glob SerPl Elph-Mcnc: 0.7 g/dL (ref 0.4–1.0)
B-Globulin SerPl Elph-Mcnc: 0.8 g/dL (ref 0.7–1.3)
Gamma Glob SerPl Elph-Mcnc: 0.9 g/dL (ref 0.4–1.8)
Globulin, Total: 2.5 g/dL (ref 2.2–3.9)
IgA: 179 mg/dL (ref 61–437)
IgG (Immunoglobin G), Serum: 1044 mg/dL (ref 603–1613)
IgM (Immunoglobulin M), Srm: 64 mg/dL (ref 15–143)
M Protein SerPl Elph-Mcnc: 0.1 g/dL — ABNORMAL HIGH
Total Protein ELP: 6 g/dL (ref 6.0–8.5)

## 2023-06-13 MED ORDER — AMIODARONE HCL 200 MG PO TABS: 100.0000 mg | ORAL_TABLET | Freq: Every day | ORAL | 1 refills | Status: DC

## 2023-06-13 NOTE — Telephone Encounter (Signed)
 Hi, I ran a test claim for amiodarone 200mg  qty 45/90ds and it came back patient charge would be 0.00 if that was ok for him to be changed to.    Loa Socks, LPN  Olene Floss, RPH-CPP; Tylene Fantasia, [facility]; Cv Div Pharmd; Rx Prior Auth Team47 minutes ago (12:43 PM)    Can you please help [place] with this?  Thanks, Triage   Loa Socks, LPN47 minutes ago (12:43 PM)    Will forward this call to our PharmD team and PA team, to see if they have any advisement on increased cost of recently reduced amiodarone dose.       Note   Malachi Carl E routed conversation to [employer] Triage1 hour ago (12:21 PM)   Malachi Carl E1 hour ago (12:21 PM)   KT Pt c/o medication issue:   1. Name of Medication:  amiodarone (PACERONE) 100 MG tablet   2. How are you currently taking this medication (dosage and times per day)?    3. Are you having a reaction (difficulty breathing--STAT)?    4. What is your medication issue?    Patient says the cost of Amiodarone increased significantly from $0 to $117 when he was switched from 200 MG to 100 MG. He would like to know if he can be switched back to the $200 MG tablet and just cut them in half. Please advise.

## 2023-06-13 NOTE — Telephone Encounter (Signed)
 Will forward this call to our PharmD team and PA team, to see if they have any advisement on increased cost of recently reduced amiodarone dose.

## 2023-06-13 NOTE — Telephone Encounter (Signed)
 Donald [name], Donald [name] - 06/13/2023 12:18 PM Willette Cluster, CPhT  Sent: Thu June 13, 2023  1:29 PM  To: Loa Socks, LPN; Olene Floss, RPH-CPP  Cc: Tylene Fantasia, RPH; P Cv Div Pharmd; P Rx Prior [employer]  Hi, I ran a test claim for amiodarone 200mg  qty 45/90ds and it came back patient charge would be 0.00 if that was ok for him to be changed to.    Will send in amiodarone 200 mg tablets with instruction for the pt to take 1/2 tablet (100 mg total) by mouth daily, with quantity 45/90 days.  Will send this to the pts confirmed pharmacy of choice.  Pt also made aware of these instructions and agrees with this plan.  He was more than gracious for all the assistance provided.

## 2023-06-13 NOTE — Telephone Encounter (Signed)
 Pt c/o medication issue:  1. Name of Medication:  amiodarone (PACERONE) 100 MG tablet  2. How are you currently taking this medication (dosage and times per day)?   3. Are you having a reaction (difficulty breathing--STAT)?   4. What is your medication issue?   Patient says the cost of Amiodarone increased significantly from $0 to $117 when he was switched from 200 MG to 100 MG. He would like to know if he can be switched back to the $200 MG tablet and just cut them in half. Please advise.

## 2023-06-14 ENCOUNTER — Ambulatory Visit
Admission: RE | Admit: 2023-06-14 | Discharge: 2023-06-14 | Disposition: A | Discharge status: Home / Self Care | Source: Ambulatory Visit | Attending: Family Medicine | Admitting: Family Medicine

## 2023-06-14 ENCOUNTER — Encounter: Payer: Self-pay | Admitting: Radiology

## 2023-06-14 ENCOUNTER — Other Ambulatory Visit (HOSPITAL_COMMUNITY): Payer: Self-pay | Admitting: Physician Assistant

## 2023-06-14 DIAGNOSIS — K5730 Diverticulosis of large intestine without perforation or abscess without bleeding: Secondary | ICD-10-CM | POA: Diagnosis not present

## 2023-06-14 DIAGNOSIS — K8020 Calculus of gallbladder without cholecystitis without obstruction: Secondary | ICD-10-CM | POA: Diagnosis not present

## 2023-06-14 DIAGNOSIS — N200 Calculus of kidney: Secondary | ICD-10-CM | POA: Diagnosis not present

## 2023-06-14 DIAGNOSIS — R1032 Left lower quadrant pain: Secondary | ICD-10-CM

## 2023-06-14 DIAGNOSIS — K4090 Unilateral inguinal hernia, without obstruction or gangrene, not specified as recurrent: Secondary | ICD-10-CM

## 2023-06-14 MED ORDER — IOPAMIDOL (ISOVUE-300) INJECTION 61%
100.0000 mL | Freq: Once | INTRAVENOUS | Status: AC | PRN
  Administered 2023-06-14: 100 mL via INTRAVENOUS

## 2023-06-17 ENCOUNTER — Telehealth: Payer: Self-pay | Admitting: Family Medicine

## 2023-06-17 NOTE — Telephone Encounter (Signed)
 IMPRESSION: Incidental left kidney stone present, in the kidney. No concerning, not obstructing or causing pain.  2. Gall bladder stones present. Only an issue if RUQ paun and nausea occur.  3. Incidental diverticulosis in lower left colon- common. Not concerning . 4. Moderately to markedly enlarged prostate gland protruding into the base of the urinary bladder> should follow with urology concerning finding. 5. Tiny umbilical hernia containing fat ( not an issue ifno pain), small right inguinal hernia containing fat (not an issue unless pain) and small to moderate-sized left inguinal hernia containing fat. Referred to surgery for evaluation . No colon entrapment present

## 2023-06-17 NOTE — Telephone Encounter (Signed)
 Pt aware and verbalized understanding. Provider notes sent to pt MyChart.

## 2023-06-27 ENCOUNTER — Ambulatory Visit (HOSPITAL_COMMUNITY)
Admission: RE | Admit: 2023-06-27 | Discharge: 2023-06-27 | Disposition: A | Discharge status: Home / Self Care | Source: Ambulatory Visit | Attending: Cardiology | Admitting: Cardiology

## 2023-06-27 DIAGNOSIS — R42 Dizziness and giddiness: Secondary | ICD-10-CM | POA: Insufficient documentation

## 2023-06-28 ENCOUNTER — Encounter: Payer: Self-pay | Admitting: Physician Assistant

## 2023-06-28 DIAGNOSIS — I779 Disorder of arteries and arterioles, unspecified: Secondary | ICD-10-CM | POA: Insufficient documentation

## 2023-07-05 ENCOUNTER — Telehealth (HOSPITAL_BASED_OUTPATIENT_CLINIC_OR_DEPARTMENT_OTHER): Payer: Self-pay | Admitting: *Deleted

## 2023-07-05 DIAGNOSIS — I25810 Atherosclerosis of coronary artery bypass graft(s) without angina pectoris: Secondary | ICD-10-CM | POA: Diagnosis not present

## 2023-07-05 DIAGNOSIS — I4811 Longstanding persistent atrial fibrillation: Secondary | ICD-10-CM | POA: Diagnosis not present

## 2023-07-05 DIAGNOSIS — K4090 Unilateral inguinal hernia, without obstruction or gangrene, not specified as recurrent: Secondary | ICD-10-CM | POA: Diagnosis not present

## 2023-07-05 NOTE — Telephone Encounter (Signed)
   Pre-operative Risk Assessment    Patient Name: Donald [name]  DOB: 01/28/1946 MRN: [id number]   Date of last office visit: 06/06/2023 Date of next office visit: 08/12/2023  Request for Surgical Clearance    Procedure:   Inguinal Hernia  Date of Surgery:  Clearance TBD                                 Surgeon:  Dr. Harman Lightning Surgeon's Group or Practice Name:   [facility] Phone number:  [phone] Fax number:  [phone]   Type of Clearance Requested:   - Medical  - Pharmacy:  Hold Apixaban  (Eliquis ) Not Indicated.   Type of Anesthesia:  General    Additional requests/questions:    Signed, Lauris Port   07/05/2023, 10:49 AM

## 2023-07-05 NOTE — Telephone Encounter (Signed)
   Name: DILLARD HINRICHSEN  DOB: 03-01-1946  MRN: [id number]  Primary Cardiologist: Avery Bodo, MD  Chart reviewed as part of pre-operative protocol coverage. The patient has an upcoming visit scheduled with Theotis Flake on 08/12/2023 at which time clearance can be addressed in case there are any issues that would impact surgical recommendations.  Inguinal hernia surgery is not scheduled until TBD as below. I added preop FYI to appointment note so that provider is aware to address at time of outpatient visit.  Per office protocol the cardiology provider should forward their finalized clearance decision and recommendations regarding antiplatelet therapy to the requesting party below.    This message will also be routed to pharmacy pool for input on holding Eliquis  as requested below so that this information is available to the clearing provider at time of patient's appointment.   I will route this message as FYI to requesting party and remove this message from the preop box as separate preop APP input not needed at this time.   Please call with any questions.  Ava Boatman, NP  07/05/2023, 11:01 AM

## 2023-07-11 NOTE — Telephone Encounter (Signed)
 Patient with diagnosis of afib on Eliquis  for anticoagulation.    Procedure: Inguinal Hernia  Date of procedure: TBD   CHA2DS2-VASc Score = 3   This indicates a 3.2% annual risk of stroke. The patient's score is based upon: CHF History: 0 HTN History: 0 Diabetes History: 0 Stroke History: 0 Vascular Disease History: 1 Age Score: 2 Gender Score: 0      CrCl 42.6 ml/min Platelet count 203  Patient has not had an Afib/aflutter ablation within the last 3 months or DCCV within the last 30 days  Per office protocol, patient can hold Eliquis  for 2 days prior to procedure.    **This guidance is not considered finalized until pre-operative APP has relayed final recommendations.**

## 2023-07-30 NOTE — Progress Notes (Signed)
 Cardiology Office Note:  .   Date:  08/12/2023  ID:  Terance Felt, DOB 01-26-1946, MRN [id number] PCP: Mariel Shope, DO  [facility] Providers Cardiologist:  Avery Bodo, MD Cardiology APP:  Alexandria Angel, PA-C  Electrophysiologist:  Jolly Needle, MD (Inactive)    History of Present Illness: .   GAIL CREEKMORE is a 78 y.o. male with a past medical history of CAD status post CABG in 2001, HLD, renal cancer status post left nephrectomy, suspected CKD 3B, PAF/atrial flutter status post flutter ablation 2006, 2011, baseline sinus bradycardia not on AVN blocking agent, anemia, hypotension requiring midodrine , BPH, colon polyps, kidney stones, RBBB, chronic dizziness.  Patient was seen 06/2023 with orthostatic hypotension. Cardiac MRI and echo normal. Amiodarone  decreased to 100 mg daily for bradycardia  and hydration.   Patient here for preop clearance for hernia repair. He continues to have dizziness-walking across parking lot at [place], working in the yard. BP is always low-87/50's-100/50 when he feels dizzy. Can go  to the gym and do light weights and treadmill for 45 min and no dizziness. Usually happens mainly in heat outside. He isn't sure how much water  he drinks  a day. Drinks 1/2 caffeine coffee in am. Drinking body armour when he comes in. Takes midodrine  3x a day. Denies chest pain, palpitations, dyspena, edema.     ROS:    Studies Reviewed: Aaron Aas    EKG Interpretation Date/Time:  Monday August 12 2023 10:37:32 EDT Ventricular Rate:  57 PR Interval:  178 QRS Duration:  112 QT Interval:  442 QTC Calculation: 430 R Axis:   -7  Text Interpretation: Sinus bradycardia Right bundle branch block Confirmed by Theotis Flake [phone]) on 08/12/2023 11:26:56 AM    Prior CV Studies:    Echo 05/10/23   IMPRESSION: 1.  Normal LV size and function LVEF 55%   2.  No evidence of amyloid   3.  No delayed gadolinium uptake normal myocardial nulling   4.  Normal RV size and  function RVEF 54%   5.  Normal cardiac valves   6.  Normal parametric measures see values above   7.  Estimated cardiac output using flow analysis 4.7 L/min  Risk Assessment/Calculations:             Physical Exam:   VS:  BP 133/75   Pulse (!) 57   Ht 5\' 5"  (1.651 m)   Wt 152 lb 6.4 oz (69.1 kg)   SpO2 97%   BMI 25.36 kg/m    Wt Readings from Last 3 Encounters:  08/12/23 152 lb 6.4 oz (69.1 kg)  06/11/23 157 lb (71.2 kg)  06/10/23 155 lb 6.4 oz (70.5 kg)    GEN: Thin, in no acute distress NECK: No JVD; No carotid bruits CARDIAC:  RRR, no murmurs, rubs, gallops RESPIRATORY:  Clear to auscultation without rales, wheezing or rhonchi  ABDOMEN: Soft, non-tender, non-distended EXTREMITIES:  No edema; No deformity   ASSESSMENT AND PLAN: .    Preop clearance for inguinal hernia repair Dr. Demaris Fillers cleared him to hold eliquis  for 2 days prior to surgery. Patient continues to have some degree of orthostatic hypotension much worse in the heat. He appears dehydrated and needs to increase fluids 64 ounces/day, compression hose, avoid activity in heat >80 degrees, increase midodrine  7.5 mg tid-he wants to try other measures before doing this. He is at reasonable risk for above surgery but may require some IV fluids if BP drops. According  to the Revised Cardiac Risk Index (RCRI), his Perioperative Risk of Major Cardiac Event is (%): 0.9  His Functional Capacity in METs is: 6.61 according to the Duke Activity Status Index (DASI).  PAF(s/p ablation 2006 & 2011) on amiodarone  with bradycardia-amio reduced 100 mg daily lov-EKG stable today. Age 6 and on appropriate dose eliquis .  Orthostatic hypotension-still orthostatic at the 3 min stand mark BP 130/70 drops to 100/60-dizzy when first gets up. Probably not drinking enough, no bleeding problems.Patient continues to have some degree of orthostatic hypotension much worse in the heat. He appears dehydrated and needs to increase  fluids 64 ounces/day, compression hose, avoid activity in heat >80 degrees, increase midodrine  7.5 mg tid-he wants to try other measures before doing this  CAD s/p CABG 2001-stable no angina  HLD on lipitor-not addressed today  CKD 3b-last crt 1.39        Dispo: f/u in 3-4 months  Signed, Theotis Flake, PA-C

## 2023-08-12 ENCOUNTER — Ambulatory Visit: Attending: Internal Medicine | Admitting: Physician Assistant

## 2023-08-12 ENCOUNTER — Encounter: Payer: Self-pay | Admitting: Physician Assistant

## 2023-08-12 VITALS — BP 133/75 | HR 57 | Ht 65.0 in | Wt 152.4 lb

## 2023-08-12 DIAGNOSIS — I2510 Atherosclerotic heart disease of native coronary artery without angina pectoris: Secondary | ICD-10-CM | POA: Diagnosis not present

## 2023-08-12 DIAGNOSIS — I9589 Other hypotension: Secondary | ICD-10-CM

## 2023-08-12 DIAGNOSIS — N1831 Chronic kidney disease, stage 3a: Secondary | ICD-10-CM

## 2023-08-12 DIAGNOSIS — Z01818 Encounter for other preprocedural examination: Secondary | ICD-10-CM

## 2023-08-12 DIAGNOSIS — E782 Mixed hyperlipidemia: Secondary | ICD-10-CM

## 2023-08-12 DIAGNOSIS — I480 Paroxysmal atrial fibrillation: Secondary | ICD-10-CM

## 2023-08-12 MED ORDER — MIDODRINE HCL 5 MG PO TABS: 7.5000 mg | ORAL_TABLET | Freq: Three times a day (TID) | ORAL | 0 refills | Status: DC

## 2023-08-12 NOTE — Patient Instructions (Addendum)
 Medication Instructions:  INCREASE YOUR MIDORINE TO 7.5 MG (1 AND 1/2 TABLET) THREE TIMES DAILY.   Lab Work: NONE   Testing/Procedures: NONE  Follow-Up: At [facility], you and your health needs are our priority.  As part of our continuing mission to provide you with exceptional heart care, our providers are all part of one team.  This team includes your primary Cardiologist (physician) and Advanced Practice Providers or APPs (Physician Assistants and Nurse Practitioners) who all work together to provide you with the care you need, when you need it.  Your next appointment:   3-4 MONTHS.  Provider:   DR. Carson Clara, MD  PLEASE MAKE SURE THAT YOU STAY HYDRATED BY DRINKING 64 OZ OF WATER  DAILY. PLEASE PURCHASE COMPRESSION STOCKINGS.

## 2023-08-15 ENCOUNTER — Ambulatory Visit: Payer: HMO | Admitting: Physician Assistant

## 2023-09-11 ENCOUNTER — Ambulatory Visit: Admitting: *Deleted

## 2023-09-11 VITALS — Ht 64.0 in | Wt 152.0 lb

## 2023-09-11 DIAGNOSIS — Z0000 Encounter for general adult medical examination without abnormal findings: Secondary | ICD-10-CM

## 2023-09-11 NOTE — Patient Instructions (Signed)
 [name] , Thank you for taking time to come for your Medicare Wellness Visit. I appreciate your ongoing commitment to your health goals. Please review the following plan we discussed and let me know if I can assist you in the future.   Screening recommendations/referrals: Colonoscopy: no longer required Recommended yearly ophthalmology/optometry visit for glaucoma screening and checkup Recommended yearly dental visit for hygiene and checkup  Vaccinations: Influenza vaccine: up to date Pneumococcal vaccine: up to date Tdap vaccine: up to date Shingles vaccine: up to date       Preventive Care 65 Years and Older, Male Preventive care refers to lifestyle choices and visits with your health care provider that can promote health and wellness. What does preventive care include? A yearly physical exam. This is also called an annual well check. Dental exams once or twice a year. Routine eye exams. Ask your health care provider how often you should have your eyes checked. Personal lifestyle choices, including: Daily care of your teeth and gums. Regular physical activity. Eating a healthy diet. Avoiding tobacco and drug use. Limiting alcohol use. Practicing safe sex. Taking low doses of aspirin  every day. Taking vitamin and mineral supplements as recommended by your health care provider. What happens during an annual well check? The services and screenings done by your health care provider during your annual well check will depend on your age, overall health, lifestyle risk factors, and family history of disease. Counseling  Your health care provider may ask you questions about your: Alcohol use. Tobacco use. Drug use. Emotional well-being. Home and relationship well-being. Sexual activity. Eating habits. History of falls. Memory and ability to understand (cognition). Work and work Astronomer. Screening  You may have the following tests or measurements: Height, weight, and  BMI. Blood pressure. Lipid and cholesterol levels. These may be checked every 5 years, or more frequently if you are over 24 years old. Skin check. Lung cancer screening. You may have this screening every year starting at age 30 if you have a 30-pack-year history of smoking and currently smoke or have quit within the past 15 years. Fecal occult blood test (FOBT) of the stool. You may have this test every year starting at age 24. Flexible sigmoidoscopy or colonoscopy. You may have a sigmoidoscopy every 5 years or a colonoscopy every 10 years starting at age 72. Prostate cancer screening. Recommendations will vary depending on your family history and other risks. Hepatitis C blood test. Hepatitis B blood test. Sexually transmitted disease (STD) testing. Diabetes screening. This is done by checking your blood sugar (glucose) after you have not eaten for a while (fasting). You may have this done every 1-3 years. Abdominal aortic aneurysm (AAA) screening. You may need this if you are a current or former smoker. Osteoporosis. You may be screened starting at age 52 if you are at high risk. Talk with your health care provider about your test results, treatment options, and if necessary, the need for more tests. Vaccines  Your health care provider may recommend certain vaccines, such as: Influenza vaccine. This is recommended every year. Tetanus, diphtheria, and acellular pertussis (Tdap, Td) vaccine. You may need a Td booster every 10 years. Zoster vaccine. You may need this after age 46. Pneumococcal 13-valent conjugate (PCV13) vaccine. One dose is recommended after age 57. Pneumococcal polysaccharide (PPSV23) vaccine. One dose is recommended after age 37. Talk to your health care provider about which screenings and vaccines you need and how often you need them. This information is not  intended to replace advice given to you by your health care provider. Make sure you discuss any questions you have  with your health care provider. Document Released: 03/18/2015 Document Revised: 11/09/2015 Document Reviewed: 12/21/2014 Elsevier Interactive Patient Education  2017 [employer].  Fall Prevention in the Home Falls can cause injuries. They can happen to people of all ages. There are many things you can do to make your home safe and to help prevent falls. What can I do on the outside of my home? Regularly fix the edges of walkways and driveways and fix any cracks. Remove anything that might make you trip as you walk through a door, such as a raised step or threshold. Trim any bushes or trees on the path to your home. Use bright outdoor lighting. Clear any walking paths of anything that might make someone trip, such as rocks or tools. Regularly check to see if handrails are loose or broken. Make sure that both sides of any steps have handrails. Any raised decks and porches should have guardrails on the edges. Have any leaves, snow, or ice cleared regularly. Use sand or salt on walking paths during winter. Clean up any spills in your garage right away. This includes oil or grease spills. What can I do in the bathroom? Use night lights. Install grab bars by the toilet and in the tub and shower. Do not use towel bars as grab bars. Use non-skid mats or decals in the tub or shower. If you need to sit down in the shower, use a plastic, non-slip stool. Keep the floor dry. Clean up any water  that spills on the floor as soon as it happens. Remove soap buildup in the tub or shower regularly. Attach bath mats securely with double-sided non-slip rug tape. Do not have throw rugs and other things on the floor that can make you trip. What can I do in the bedroom? Use night lights. Make sure that you have a light by your bed that is easy to reach. Do not use any sheets or blankets that are too big for your bed. They should not hang down onto the floor. Have a firm chair that has side arms. You can use  this for support while you get dressed. Do not have throw rugs and other things on the floor that can make you trip. What can I do in the kitchen? Clean up any spills right away. Avoid walking on wet floors. Keep items that you use a lot in easy-to-reach places. If you need to reach something above you, use a strong step stool that has a grab bar. Keep electrical cords out of the way. Do not use floor polish or wax that makes floors slippery. If you must use wax, use non-skid floor wax. Do not have throw rugs and other things on the floor that can make you trip. What can I do with my stairs? Do not leave any items on the stairs. Make sure that there are handrails on both sides of the stairs and use them. Fix handrails that are broken or loose. Make sure that handrails are as long as the stairways. Check any carpeting to make sure that it is firmly attached to the stairs. Fix any carpet that is loose or worn. Avoid having throw rugs at the top or bottom of the stairs. If you do have throw rugs, attach them to the floor with carpet tape. Make sure that you have a light switch at the top of the stairs  and the bottom of the stairs. If you do not have them, ask someone to add them for you. What else can I do to help prevent falls? Wear shoes that: Do not have high heels. Have rubber bottoms. Are comfortable and fit you well. Are closed at the toe. Do not wear sandals. If you use a stepladder: Make sure that it is fully opened. Do not climb a closed stepladder. Make sure that both sides of the stepladder are locked into place. Ask someone to hold it for you, if possible. Clearly mark and make sure that you can see: Any grab bars or handrails. First and last steps. Where the edge of each step is. Use tools that help you move around (mobility aids) if they are needed. These include: Canes. Walkers. Scooters. Crutches. Turn on the lights when you go into a dark area. Replace any light bulbs  as soon as they burn out. Set up your furniture so you have a clear path. Avoid moving your furniture around. If any of your floors are uneven, fix them. If there are any pets around you, be aware of where they are. Review your medicines with your doctor. Some medicines can make you feel dizzy. This can increase your chance of falling. Ask your doctor what other things that you can do to help prevent falls. This information is not intended to replace advice given to you by your health care provider. Make sure you discuss any questions you have with your health care provider. Document Released: 12/16/2008 Document Revised: 07/28/2015 Document Reviewed: 03/26/2014 Elsevier Interactive Patient Education  2017 [employer].

## 2023-09-11 NOTE — Progress Notes (Signed)
 Subjective:   Donald [name] is a 78 y.o. male who presents for Medicare Annual/Subsequent preventive examination.  Visit Complete: Virtual I connected with  Donald [name] on 09/11/23 by a audio enabled telemedicine application and verified that I am speaking with the correct person using two identifiers.  Patient Location: Home  Provider Location: Home Office  I discussed the limitations of evaluation and management by telemedicine. The patient expressed understanding and agreed to proceed.  Vital Signs: Because this visit was a virtual/telehealth visit, some criteria may be missing or patient reported. Any vitals not documented were not able to be obtained and vitals that have been documented are patient reported.  Cardiac Risk Factors include: advanced age (>16men, >33 women);male gender;obesity (BMI >30kg/m2)     Objective:    Today's Vitals   09/11/23 1548  Weight: 152 lb (68.9 kg)  Height: 5' 4 (1.626 m)   Body mass index is 26.09 kg/m.     09/11/2023    3:47 PM 06/10/2023   11:12 AM 04/23/2023    3:27 PM 04/09/2023    9:16 AM 07/11/2022    4:27 PM 06/07/2021    3:12 PM 12/03/2020    5:00 AM  Advanced Directives  Does Patient Have a Medical Advance Directive? Yes Yes Yes Yes Yes Yes Yes  Type of Advance Directive Healthcare Power of Attorney Living will;Healthcare Power of [employer] Power of [place];Living will Healthcare Power of [place];Living will Healthcare Power of [place];Living will Healthcare Power of [employer] of [place];Living will  Does patient want to make changes to medical advance directive?    No - Patient declined No - Patient declined  No - Patient declined  Copy of Healthcare Power of Attorney in Chart? No - copy requested Yes - validated most recent copy scanned in chart (See row information) Yes - validated most recent copy scanned in chart (See row information) Yes - validated most recent copy scanned in chart (See row  information) Yes - validated most recent copy scanned in chart (See row information) Yes - validated most recent copy scanned in chart (See row information) No - copy requested    Current Medications (verified) Outpatient Encounter Medications as of 09/11/2023  Medication Sig   acetaminophen  (TYLENOL ) 500 MG tablet Take by mouth.   amiodarone  (PACERONE ) 200 MG tablet Take 0.5 tablets (100 mg total) by mouth daily.   apixaban  (ELIQUIS ) 5 MG TABS tablet Take 1 tablet by mouth twice daily   atorvastatin  (LIPITOR) 40 MG tablet Take 40 mg by mouth daily.   cephALEXin  (KEFLEX ) 500 MG capsule Take by mouth as needed. Prior to dental appointments   midodrine  (PROAMATINE ) 5 MG tablet Take 1.5 tablets (7.5 mg total) by mouth 3 (three) times daily with meals.   Multiple Vitamin (MULTIVITAMIN ADULT PO) Take 1 tablet by mouth daily.   Zinc 50 MG TABS Take 1 tablet by mouth daily at 12 noon.   No facility-administered encounter medications on file as of 09/11/2023.    Allergies (verified) Patient has no known allergies.   History: Past Medical History:  Diagnosis Date   Age-related nuclear cataract, bilateral 06/29/2020   Allergic rhinitis    BPH (benign prostatic hyperplasia)    Chicken pox    Coronary artery disease    a. MI 2003 with CABG.   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    Osteoarthritis of knee 05/13/2017   Renal cell carcinoma (HCC) 02/04/2019   Partial nephrectomyKIDNEY, LEFT, PARTIAL NEPHRECTOMY:    Right ureteral stone    S/P ablation of atrial flutter    followed by dr kelsie (EP cardiolgoist)  ablation 04-17-2004  and by dr allred 02-10- 2011 was successful    S/P CABG x 5 08-05-2001   in [place] , [place]   LIMA to LAD,  seqVG to Diagonal,  SeqVG to OM,  seqVG to PDA and PLA   Seasonal allergies    Sinus bradycardia    Wears glasses    Past Surgical History:  Procedure Laterality Date   CARDIAC CATHETERIZATION  08-04-2001  in [place], [place]   severe 3 vessel cad   CARDIAC ELECTROPHYSIOLOGY MAPPING AND ABLATION  x2  04-17-2004 [place], [place]);  04-14-2009 by dr kelsie   per dr allred report-- successful ablation clockwise isthmus-dependent right atrial flutter along the usual cavotricupid isthmus;  complete bidirectionl isthmus block achieved   CORONARY ARTERY BYPASS GRAFT  08-06-2003  in [place], [place]   x5-- LIMA - LAD,  seqVG to Diagonal, OM, PDA, and PLA   CYSTOSCOPY WITH RETROGRADE PYELOGRAM, URETEROSCOPY AND STENT PLACEMENT Right 08/10/2016   Procedure: CYSTOSCOPY WITH RETROGRADE PYELOGRAM, URETEROSCOPY AND STENT PLACEMENT;  Surgeon: Alvaro Hummer, MD;  Location: [facility] [place];  Service: Urology;  Laterality: Right;   FIXATION RIGHT WRIST Right 2011   stability for arthritis   INGUINAL HERNIA REPAIR Right 08-08-2015   [facility]- health care at [facility] [place]   ROBOTIC ASSITED PARTIAL NEPHRECTOMY Left 02/04/2019   Procedure: XI ROBOTIC ASSITED LEFT PARTIAL NEPHRECTOMY; LEFT PARTIAL ADRENALECTOMY;  Surgeon: Alvaro Hummer, MD;  Location: [facility];  Service: Urology;  Laterality: Left;  3 HRS   ROTATOR CUFF REPAIR Right 2004;  2007   TOTAL KNEE ARTHROPLASTY Left 11/30/2020   Procedure: TOTAL KNEE ARTHROPLASTY;  Surgeon: Duwayne Purchase, MD;  Location: [facility];  Service: Orthopedics;  Laterality: Left;   WISDOM TOOTH EXTRACTION     Family History  Problem Relation Age of Onset   Heart attack Mother    Alzheimer's disease Mother    Cancer Father        throat cancer, chewing tobacco   Hypertension Neg Hx    Social History   Socioeconomic History   Marital status: Married    Spouse name: Not on file   Number of children: Not on file   Years  of education: Not on file   Highest education level: Not on file  Occupational History   Occupation: retired  Tobacco Use   Smoking status: Never    Passive exposure: Past   Smokeless tobacco: Current    Types: Snuff   Tobacco comments:    03/16/2021 ~ dip tobacco for 30 yrs  Vaping Use   Vaping status: Never Used  Substance and Sexual Activity   Alcohol use: Not Currently    Comment: occ.    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; Tour manager      Home Situation:  lives with wife      Spiritual Beliefs: Christian      Lifestyle: tries to make it to the gym 5 days per week; healthy diet     -smoke alarm in the home:Yes     - wears seatbelt: Yes     - Feels safe in their relationships: Yes         Social Drivers of Corporate investment banker Strain: Low Risk  (09/11/2023)   Overall Financial Resource Strain (CARDIA)    Difficulty of Paying Living Expenses: Not hard at all  Food Insecurity: No Food Insecurity (09/11/2023)   Hunger Vital Sign    Worried About Running Out of Food in the Last Year: Never true    Ran Out of Food in the Last Year: Never true  Transportation Needs: No Transportation Needs (09/11/2023)   PRAPARE - Administrator, Civil Service (Medical): No    Lack of Transportation (Non-Medical): No  Physical Activity: Sufficiently Active (09/11/2023)   Exercise Vital Sign    Days of Exercise per Week: 4 days    Minutes of Exercise per Session: 50 min  Stress: No Stress Concern Present (09/11/2023)   [employer] of Occupational Health - Occupational Stress Questionnaire    Feeling of Stress: Not at all  Social Connections: Unknown (09/11/2023)   Social Connection and Isolation Panel    Frequency of Communication with Friends and Family: More than three times a week    Frequency of Social Gatherings with  Friends and Family: More than three times a week    Attends Religious Services: Never    Database administrator or Organizations: No    Attends Engineer, structural: More than 4 times per year    Marital Status: Patient declined    Tobacco Counseling Ready to quit: Not Answered Counseling given: Not Answered Tobacco comments: 03/16/2021 ~ dip tobacco for 30 yrs   Clinical Intake:  Pre-visit preparation completed: Yes  Pain : No/denies pain     Diabetes: No  How often do you need to have someone help you when you read instructions, pamphlets, or other written materials from your doctor or pharmacy?: 1 - Never  Interpreter Needed?: No  Information entered by :: Mliss Graff LPN   Activities of Daily Living    09/11/2023    3:53 PM  In your present state of health, do you have any difficulty performing the following activities:  Hearing? 0  Vision? 0  Difficulty concentrating or making decisions? 0  Walking or climbing stairs? 0  Dressing or bathing? 0  Doing errands, shopping? 0  Preparing Food and eating ? N  Using the Toilet? N  In the past six months, have you accidently leaked urine? Y  Do you have problems with loss of bowel control? N  Managing your Medications? N  Managing your Finances? N  Housekeeping or managing your Housekeeping? N    Patient Care Team: Catherine Charlies LABOR, DO as PCP - General (Family Medicine) Dann Candyce RAMAN, MD as PCP - Cardiology (Cardiology) Kelsie Agent, MD (Inactive) as PCP - Electrophysiology (Cardiology) Steen Evalene CROME., MD as Consulting Physician (Urology) Dann Candyce RAMAN, MD as Consulting Physician (Cardiology) Alvaro Ricardo KATHEE Raddle., MD as Consulting Physician (Urology) Joshua Blamer, MD as Consulting Physician (Dermatology) Dunn, Dayna N, PA-C as Physician Assistant (Cardiology)  Indicate any recent Medical Services you may have received from other than [facility] providers in the past year (date may be  approximate).     Assessment:   This is a routine wellness examination for [place].  Hearing/Vision screen Hearing Screening - Comments:: No trouble hearing [facility] - [facility]:: [facility]   Goals Addressed             This Visit's Progress    Patient Stated   On track    Drink more water  & stay active     Patient Stated   On track    Patient Stated   On track    None at this time      Patient Stated   On track    None at this time     Patient Stated       Stay healthy       Depression Screen    09/11/2023    3:53 PM 04/09/2023    9:51 AM 07/11/2022    4:26 PM 06/07/2021    3:11 PM 01/20/2021   10:11 AM 06/01/2020    2:20 PM 01/12/2019    1:33 PM  PHQ 2/9 Scores  PHQ - 2 Score 0 0 0 0 0 0 0  PHQ- 9 Score 0          Fall Risk    09/11/2023    3:46 PM 07/11/2022    4:27 PM 06/07/2021    3:13 PM 01/20/2021   10:11 AM 06/01/2020    2:20 PM  Fall Risk   Falls in the past year? 0 0 0 0 0  Number falls in past yr: 0 0 0 0 0  Injury with Fall? 0 0 0 0 0  Risk for fall due to :  Impaired vision Impaired vision    Follow up Falls evaluation completed;Education provided;Falls prevention discussed Falls prevention discussed Falls prevention discussed  Falls evaluation completed  Falls prevention discussed      Data saved with a previous flowsheet row definition    MEDICARE RISK AT HOME: Medicare Risk at Home Any stairs in or around the home?: No If so, are there any without handrails?: No Home free of loose throw rugs in walkways, pet beds, electrical cords, etc?: Yes Adequate lighting in your home to reduce risk of falls?: Yes Life alert?: No Use of a cane, walker or w/c?: No Grab bars in the bathroom?: Yes Shower chair or bench in shower?: Yes Elevated toilet seat or a handicapped toilet?: Yes  TIMED UP AND GO:  Was the test performed?  No    Cognitive Function:        09/11/2023    3:49 PM 06/07/2021    3:14 PM 06/01/2020    2:25 PM  6CIT Screen   What Year? 0 points 0 points 0 points  What month? 0 points 0 points 0 points  What time? 0 points 0 points 0 points  Count back from 20 0 points 0 points 0 points  Months in reverse 4 points 0 points 0 points  Repeat phrase 2 points 6 points 0 points  Total Score 6 points 6 points 0 points    Immunizations Immunization History  Administered Date(s) Administered   Fluad Quad(high Dose 65+) 12/19/2018, 12/14/2019, 01/20/2021, 12/29/2021   Fluad Trivalent(High Dose 65+) 01/23/2023   Influenza Split 01/03/2013, 12/03/2013, 01/04/2016, 01/03/2017   Influenza, High Dose Seasonal PF 12/27/2016   Influenza,inj,Quad PF,6+ Mos 01/12/2014   Influenza,inj,quad, With Preservative 12/03/2016, 12/17/2017   Influenza-Unspecified 01/03/2009, 01/04/2011, 01/04/2012, 12/19/2014, 12/19/2018, 12/04/2019, 01/20/2021, 12/03/2021   PFIZER(Purple Top)SARS-COV-2 Vaccination 04/30/2019, 05/20/2019,  01/11/2020   PNEUMOCOCCAL CONJUGATE-20 02/08/2023   Pneumococcal Conjugate-13 09/24/2013, 11/03/2013   Pneumococcal Polysaccharide-23 12/03/2016   Pneumococcal-Unspecified 12/04/2007, 12/03/2016   Respiratory Syncytial Virus Vaccine,Recomb Aduvanted(Arexvy) 02/21/2022   Td 06/11/2016   Td (Adult) 06/11/2016   Tdap 03/05/2006   Zoster Recombinant(Shingrix) 11/12/2019, 02/11/2020     TDAP status: Up to date  Flu Vaccine status: Up to date  Pneumococcal vaccine status: Up to date  Covid-19 vaccine status: Information provided on how to obtain vaccines.   Qualifies for Shingles Vaccine? No   Zostavax completed Yes   Shingrix Completed?: Yes  Screening Tests Health Maintenance  Topic Date Due   COVID-19 Vaccine (4 - 2024-25 season) 11/04/2022   INFLUENZA VACCINE  10/04/2023   Medicare Annual Wellness (AWV)  09/10/2024   DTaP/Tdap/Td (4 - Td or Tdap) 06/12/2026   Pneumococcal Vaccine: 50+ Years  Completed   Zoster Vaccines- Shingrix  Completed   Hepatitis B Vaccines  Aged Out   HPV VACCINES  Aged  Out   Meningococcal B Vaccine  Aged Out   Fecal DNA (Cologuard)  Discontinued    Health Maintenance  Health Maintenance Due  Topic Date Due   COVID-19 Vaccine (4 - 2024-25 season) 11/04/2022    Colorectal cancer screening: No longer required.   Lung Cancer Screening: (Low Dose CT Chest recommended if Age 40-80 years, 20 pack-year currently smoking OR have quit w/in 15years.) does not qualify.   Lung Cancer Screening Referral:   Additional Screening:  Hepatitis C Screening:  never done  Vision Screening: Recommended annual ophthalmology exams for early detection of glaucoma and other disorders of the eye. Is the patient up to date with their annual eye exam?  Yes  Who is the provider or what is the name of the office in which the patient attends annual eye exams? [facility] If pt is not established with a provider, would they like to be referred to a provider to establish care? No .   Dental Screening: Recommended annual dental exams for proper oral hygiene    Community Resource Referral / Chronic Care Management: CRR required this visit?  No   CCM required this visit?  No     Plan:     I have personally reviewed and noted the following in the patient's chart:   Medical and social history Use of alcohol, tobacco or illicit drugs  Current medications and supplements including opioid prescriptions. Patient is not currently taking opioid prescriptions. Functional ability and status Nutritional status Physical activity Advanced directives List of other physicians Hospitalizations, surgeries, and ER visits in previous 12 months Vitals Screenings to include cognitive, depression, and falls Referrals and appointments  In addition, I have reviewed and discussed with patient certain preventive protocols, quality metrics, and best practice recommendations. A written personalized care plan for preventive services as well as general preventive health recommendations were provided  to patient.     Mliss Graff, LPN   2/0/7974   After Visit Summary: (MyChart) Due to this being a telephonic visit, the after visit summary with patients personalized plan was offered to patient via MyChart   Nurse Notes:

## 2023-10-01 ENCOUNTER — Telehealth: Payer: Self-pay | Admitting: Oncology

## 2023-10-01 NOTE — Telephone Encounter (Signed)
 Pt will be going out of town. Doesn't know when he will be back in town. Will call to reschedule.

## 2023-10-07 NOTE — Progress Notes (Signed)
 Surgery orders requested via Epic inbox.

## 2023-10-08 NOTE — Progress Notes (Addendum)
 Anesthesia Review:  PCP: Charlies Bellini LVO 06/21/23  Cardiologist :  Dann- LOV 10/07/21 Michelle Lenze,PAC LOV 08/12/23  Dayna Dunn,PAC- LOV 02/14/23    PPM/ ICD: Device Orders: Rep Notified:  Chest x-ray : EKG : 08/12/23  Carotids- 06/07/23  MR Card-05/10/23  Echo : 2022  Stress test: Cardiac Cath :   Activity level: can do a flight of stairs wtihout difficulty  Sleep Study/ CPAP : none  Fasting Blood Sugar :      / Checks Blood Sugar -- times a day:    Blood Thinner/ Instructions /Last Dose: ASA / Instructions/ Last Dose :    Eliquis - last dose on 10/28/23 per pt    No orders at preop on 10/14/23. Requested again on 10/14/2023.    PT reports at preop he has ongoing am issues with dizziness.  IN epic he has had office visits in relation to this and medications have been adjusted.  When questioned the pt about when he gets up in the am does he sits on side of bed for a few minutes before getting up.  PT reports he does not.  Instructed pt to sit on side of bed for 5 minutes before getting up to see if  dizziness in am impraoves.  Also quesitoned pt about hydration and pt does not drink a lot he states and typically does not drink anything after dinner at 6pm until the next am. Instructed pt to drink fluids at nite before bedtime and to eat a bedtime snack to see if that improves am blood pressure issues.  And dizziness.  Pt voiced understanding.  PT states his am blood pressure this am was in the 80s but then after he ate breakfast it improved to 110 .systolic.     BMP done8/11/25 routed to DR Kinsinger on 10/14/23.

## 2023-10-09 ENCOUNTER — Encounter (HOSPITAL_COMMUNITY): Payer: Self-pay

## 2023-10-09 NOTE — Patient Instructions (Addendum)
 SURGICAL WAITING ROOM VISITATION  Patients having surgery or a procedure may have no more than 2 support people in the waiting area - these visitors may rotate.    Children under the age of 6 must have an adult with them who is not the patient.  Visitors with respiratory illnesses are discouraged from visiting and should remain at home.  If the patient needs to stay at the hospital during part of their recovery, the visitor guidelines for inpatient rooms apply. Pre-op nurse will coordinate an appropriate time for 1 support person to accompany patient in pre-op.  This support person may not rotate.    Please refer to the [facility] website for the visitor guidelines for Inpatients (after your surgery is over and you are in a regular room).       Your procedure is scheduled on: 10/31/2023    Report to [facility] Main Entrance    Report to admitting at  1130 AM   Call this number if you have problems the morning of surgery [phone]   Do not eat food :After Midnight.   After Midnight you may have the following liquids until ___ 1030___ AM  DAY OF SURGERY  Water  Non-Citrus Juices (without pulp, NO RED-Apple, White grape, White cranberry) Black Coffee (NO MILK/CREAM OR CREAMERS, sugar ok)  Clear Tea (NO MILK/CREAM OR CREAMERS, sugar ok) regular and decaf                             Plain Jell-O (NO RED)                                           Fruit ices (not with fruit pulp, NO RED)                                     Popsicles (NO RED)                                                               Sports drinks like Gatorade (NO RED)                            If you have questions, please contact your surgeon's office.       Oral Hygiene is also important to reduce your risk of infection.                                    Remember - BRUSH YOUR TEETH THE MORNING OF SURGERY WITH YOUR REGULAR TOOTHPASTE  DENTURES WILL BE REMOVED PRIOR TO SURGERY PLEASE DO NOT  APPLY Poly grip OR ADHESIVES!!!   Do NOT smoke after Midnight   Stop all vitamins and herbal supplements 7 days before surgery.   Take these medicines the morning of surgery with A SIP OF WATER : Amiodarone , Midodrine   DO NOT TAKE ANY ORAL DIABETIC MEDICATIONS DAY OF YOUR SURGERY  Bring CPAP mask and tubing day of  surgery.                              You may not have any metal on your body including hair pins, jewelry, and body piercing             Do not wear make-up, lotions, powders, perfumes/cologne, or deodorant  Do not wear nail polish including gel and S&S, artificial/acrylic nails, or any other type of covering on natural nails including finger and toenails. If you have artificial nails, gel coating, etc. that needs to be removed by a nail salon please have this removed prior to surgery or surgery may need to be canceled/ delayed if the surgeon/ anesthesia feels like they are unable to be safely monitored.   Do not shave  48 hours prior to surgery.               Men may shave face and neck.   Do not bring valuables to the hospital. [facility] IS NOT             RESPONSIBLE   FOR VALUABLES.   Contacts, glasses, dentures or bridgework may not be worn into surgery.   Bring small overnight bag day of surgery.   DO NOT BRING YOUR HOME MEDICATIONS TO THE HOSPITAL. PHARMACY WILL DISPENSE MEDICATIONS LISTED ON YOUR MEDICATION LIST TO YOU DURING YOUR ADMISSION IN THE HOSPITAL!    Patients discharged on the day of surgery will not be allowed to drive home.  Someone NEEDS to stay with you for the first 24 hours after anesthesia.   Special Instructions: Bring a copy of your healthcare power of attorney and living will documents the day of surgery if you haven't scanned them before.              Please read over the following fact sheets you were given: IF YOU HAVE QUESTIONS ABOUT YOUR PRE-OP INSTRUCTIONS PLEASE CALL [phone].   If you received a COVID test during your pre-op  visit  it is requested that you wear a mask when out in public, stay away from anyone that may not be feeling well and notify your surgeon if you develop symptoms. If you test positive for Covid or have been in contact with anyone that has tested positive in the last 10 days please notify you surgeon.    [facility] - Preparing for Surgery Before surgery, you can play an important role.  Because skin is not sterile, your skin needs to be as free of germs as possible.  You can reduce the number of germs on your skin by washing with CHG (chlorahexidine gluconate) soap before surgery.  CHG is an antiseptic cleaner which kills germs and bonds with the skin to continue killing germs even after washing. Please DO NOT use if you have an allergy to CHG or antibacterial soaps.  If your skin becomes reddened/irritated stop using the CHG and inform your nurse when you arrive at Short Stay. Do not shave (including legs and underarms) for at least 48 hours prior to the first CHG shower.  You may shave your face/neck. Please follow these instructions carefully:  1.  Shower with CHG Soap the night before surgery and the  morning of Surgery.  2.  If you choose to wash your hair, wash your hair first as usual with your  normal  shampoo.  3.  After you shampoo, rinse your hair and body thoroughly to  remove the  shampoo.                           4.  Use CHG as you would any other liquid soap.  You can apply chg directly  to the skin and wash                       Gently with a scrungie or clean washcloth.  5.  Apply the CHG Soap to your body ONLY FROM THE NECK DOWN.   Do not use on face/ open                           Wound or open sores. Avoid contact with eyes, ears mouth and genitals (private parts).                       Wash face,  Genitals (private parts) with your normal soap.             6.  Wash thoroughly, paying special attention to the area where your surgery  will be performed.  7.  Thoroughly rinse your  body with warm water  from the neck down.  8.  DO NOT shower/wash with your normal soap after using and rinsing off  the CHG Soap.                9.  Pat yourself dry with a clean towel.            10.  Wear clean pajamas.            11.  Place clean sheets on your bed the night of your first shower and do not  sleep with pets. Day of Surgery : Do not apply any lotions/deodorants the morning of surgery.  Please wear clean clothes to the hospital/surgery center.  FAILURE TO FOLLOW THESE INSTRUCTIONS MAY RESULT IN THE CANCELLATION OF YOUR SURGERY PATIENT SIGNATURE_________________________________  NURSE SIGNATURE__________________________________  ________________________________________________________________________

## 2023-10-10 NOTE — Progress Notes (Signed)
 Second request for pre op orders, spoke with Nat.

## 2023-10-14 ENCOUNTER — Ambulatory Visit: Admitting: Oncology

## 2023-10-14 ENCOUNTER — Other Ambulatory Visit

## 2023-10-14 ENCOUNTER — Encounter (HOSPITAL_COMMUNITY)
Admission: RE | Admit: 2023-10-14 | Discharge: 2023-10-14 | Disposition: A | Discharge status: Home / Self Care | Source: Ambulatory Visit | Attending: General Surgery | Admitting: General Surgery

## 2023-10-14 ENCOUNTER — Other Ambulatory Visit: Payer: Self-pay

## 2023-10-14 ENCOUNTER — Encounter (HOSPITAL_COMMUNITY): Payer: Self-pay

## 2023-10-14 VITALS — BP 153/78 | HR 59 | Temp 98.2°F | Resp 16 | Ht 65.0 in | Wt 147.0 lb

## 2023-10-14 DIAGNOSIS — Z01818 Encounter for other preprocedural examination: Secondary | ICD-10-CM | POA: Insufficient documentation

## 2023-10-14 HISTORY — DX: Heart failure, unspecified: I50.9

## 2023-10-14 HISTORY — DX: Type 2 diabetes mellitus without complications: E11.9

## 2023-10-14 HISTORY — DX: Cardiac murmur, unspecified: R01.1

## 2023-10-14 LAB — BASIC METABOLIC PANEL WITH GFR
Anion gap: 10 (ref 5–15)
BUN: 26 mg/dL — ABNORMAL HIGH (ref 8–23)
CO2: 25 mmol/L (ref 22–32)
Calcium: 9.4 mg/dL (ref 8.9–10.3)
Chloride: 103 mmol/L (ref 98–111)
Creatinine, Ser: 1.57 mg/dL — ABNORMAL HIGH (ref 0.61–1.24)
GFR, Estimated: 44 mL/min — ABNORMAL LOW (ref >60)
Glucose, Bld: 99 mg/dL (ref 70–99)
Potassium: 4.8 mmol/L (ref 3.5–5.1)
Sodium: 138 mmol/L (ref 135–145)

## 2023-10-14 LAB — CBC
HCT: 38 % — ABNORMAL LOW (ref 39.0–52.0)
Hemoglobin: 11.8 g/dL — ABNORMAL LOW (ref 13.0–17.0)
MCH: 29.2 pg (ref 26.0–34.0)
MCHC: 31.1 g/dL (ref 30.0–36.0)
MCV: 94.1 fL (ref 80.0–100.0)
Platelets: 208 K/uL (ref 150–400)
RBC: 4.04 MIL/uL — ABNORMAL LOW (ref 4.22–5.81)
RDW: 13.8 % (ref 11.5–15.5)
WBC: 6.5 K/uL (ref 4.0–10.5)
nRBC: 0 % (ref 0.0–0.2)

## 2023-10-16 ENCOUNTER — Ambulatory Visit: Payer: Self-pay | Admitting: General Surgery

## 2023-10-21 NOTE — Progress Notes (Signed)
 Anesthesia Chart Review   Case: [id number] Date/Time: 10/31/23 1315   Procedure: REPAIR, HERNIA, INGUINAL, ADULT (Left) - GEN/TAP BLOCK   Anesthesia type: General   Diagnosis: Non-recurrent unilateral inguinal hernia without obstruction or gangrene [K40.90]   Pre-op diagnosis: LEFT INGUINAL HERNIA   Location: [facility] ROOM 06 / [facility]   Surgeons: Kinsinger, Herlene Righter, MD       DISCUSSION:78 y.o. never smoker with h/o CAD s/p CABG 2003, CHF, atrial flutter s/p ablation, BPH, CKD Stage III, renal cell carcinoma s/p left partial nephrectomy 2020, left inguinal hernia scheduled for above procedure 10/31/2023 with Dr. Herlene Bureau.   Pt last seen by cardiology 08/12/2023. Per OV note, Preop clearance for inguinal hernia repair Dr. Herlene Gauze cleared him to hold eliquis  for 2 days prior to surgery. Patient continues to have some degree of orthostatic hypotension much worse in the heat. He appears dehydrated and needs to increase fluids 64 ounces/day, compression hose, avoid activity in heat >80 degrees, increase midodrine  7.5 mg tid-he wants to try other measures before doing this. He is at reasonable risk for above surgery but may require some IV fluids if BP drops. According to the Revised Cardiac Risk Index (RCRI), his Perioperative Risk of Major Cardiac Event is (%): 0.9   His Functional Capacity in METs is: 6.61 according to the Duke Activity Status Index (DASI).  Pt reports last dose of Eliquis  10/28/23.   VS: BP (!) 153/78   Pulse (!) 59   Temp 36.8 C (Oral)   Resp 16   Ht 5' 5 (1.651 m)   Wt 66.7 kg   SpO2 100%   BMI 24.46 kg/m   PROVIDERS: Catherine Charlies LABOR, DO is PCP   Cardiologist:  Candyce Reek, MD  LABS: Labs reviewed: Acceptable for surgery. (all labs ordered are listed, but only abnormal results are displayed)  Labs Reviewed  BASIC METABOLIC PANEL WITH GFR - Abnormal; Notable for the following components:      Result Value   BUN 26 (*)    Creatinine,  Ser 1.57 (*)    GFR, Estimated 44 (*)    All other components within normal limits  CBC - Abnormal; Notable for the following components:   RBC 4.04 (*)    Hemoglobin 11.8 (*)    HCT 38.0 (*)    All other components within normal limits     IMAGES:   EKG:   CV: Cardiac MRI 05/10/2023 IMPRESSION: 1.  Normal LV size and function LVEF 55%   2.  No evidence of amyloid   3.  No delayed gadolinium uptake normal myocardial nulling   4.  Normal RV size and function RVEF 54%   5.  Normal cardiac valves   6.  Normal parametric measures see values above   7.  Estimated cardiac output using flow analysis 4.7 L/min  Echo 12/02/20 1. Left ventricular ejection fraction, by estimation, is 70 to 75%. The  left ventricle has hyperdynamic function. The left ventricle has no  regional wall motion abnormalities. Left ventricular diastolic function  could not be evaluated.   2. Right ventricular systolic function is normal. The right ventricular  size is normal.   3. The mitral valve is grossly normal. No evidence of mitral valve  regurgitation.   4. The aortic valve is tricuspid. Aortic valve regurgitation is not  visualized. No aortic stenosis is present.   5. The inferior vena cava is normal in size with <50% respiratory  variability, suggesting right atrial pressure  of 8 mmHg.   Comparison(s): Changes from prior study are noted. 01/01/2020: LVEF  55-60%, normal atrial size.  Past Medical History:  Diagnosis Date   Age-related nuclear cataract, bilateral 06/29/2020   Allergic rhinitis    BPH (benign prostatic hyperplasia)    CHF (congestive heart failure) (HCC)    Chicken pox    Coronary artery disease    a. MI 2003 with CABG.   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    Renal cell carcinoma (HCC) 02/04/2019   Partial nephrectomyKIDNEY, LEFT, PARTIAL NEPHRECTOMY:    Right ureteral stone    S/P ablation of atrial flutter    followed by dr kelsie (EP cardiolgoist)  ablation 04-17-2004  and by dr allred 02-10- 2011 was successful   S/P CABG x 5 08-05-2001   in [place] , [place]   LIMA to LAD,  seqVG to Diagonal,  SeqVG to OM,  seqVG to PDA and PLA   Seasonal allergies    Sinus bradycardia    Wears glasses     Past Surgical History:  Procedure Laterality Date   CARDIAC CATHETERIZATION  08-04-2001  in [place], [place]   severe 3 vessel cad   CARDIAC ELECTROPHYSIOLOGY MAPPING AND ABLATION  x2  04-17-2004 [place], [place]);  04-14-2009 by dr kelsie   per dr allred report-- successful ablation clockwise isthmus-dependent right atrial flutter along the usual cavotricupid isthmus;  complete bidirectionl isthmus block achieved   CORONARY ARTERY BYPASS GRAFT  08-06-2003  in [place], [place]   x5-- LIMA - LAD,  seqVG to Diagonal, OM, PDA, and PLA   CYSTOSCOPY WITH RETROGRADE PYELOGRAM, URETEROSCOPY AND STENT PLACEMENT Right 08/10/2016   Procedure: CYSTOSCOPY WITH RETROGRADE PYELOGRAM, URETEROSCOPY AND STENT PLACEMENT;  Surgeon: Alvaro Hummer, MD;  Location: [facility];  Service: Urology;  Laterality: Right;   FIXATION RIGHT WRIST Right 2011   stability for arthritis   INGUINAL HERNIA REPAIR Right 08-08-2015   [facility]- health care at [facility] [place]   ROBOTIC ASSITED PARTIAL NEPHRECTOMY Left 02/04/2019   Procedure: XI ROBOTIC ASSITED LEFT PARTIAL NEPHRECTOMY; LEFT PARTIAL ADRENALECTOMY;  Surgeon: Alvaro Hummer, MD;  Location[facility];  Service: Urology;  Laterality: Left;  3 HRS   ROTATOR CUFF REPAIR Right 2004;  2007   TOTAL KNEE ARTHROPLASTY Left 11/30/2020   Procedure: TOTAL KNEE ARTHROPLASTY;  Surgeon: Duwayne Purchase, MD;  Location: [facility];  Service: Orthopedics;  Laterality: Left;    WISDOM TOOTH EXTRACTION      MEDICATIONS:  acetaminophen  (TYLENOL ) 500 MG tablet   amiodarone  (PACERONE ) 200 MG tablet   apixaban  (ELIQUIS ) 5 MG TABS tablet   atorvastatin  (LIPITOR) 40 MG tablet   cephALEXin  (KEFLEX ) 500 MG capsule   midodrine  (PROAMATINE ) 5 MG tablet   Multiple Vitamin (MULTIVITAMIN ADULT PO)   Zinc 50 MG TABS   No current facility-administered medications for this encounter.    Harlene Hoots Ward, PA-C [facility] [phone]

## 2023-10-21 NOTE — Anesthesia Preprocedure Evaluation (Addendum)
 Anesthesia Evaluation  Patient identified by MRN, date of birth, ID band Patient awake    Reviewed: Allergy & Precautions, NPO status , Patient's Chart, lab work & pertinent test results  Airway Mallampati: II  TM Distance: >3 FB Neck ROM: Full    Dental  (+) Teeth Intact, Dental Advisory Given   Pulmonary neg pulmonary ROS   breath sounds clear to auscultation       Cardiovascular + CAD, + Past MI, + CABG and +CHF  + dysrhythmias  Rhythm:Regular Rate:Normal     Neuro/Psych negative neurological ROS  negative psych ROS   GI/Hepatic negative GI ROS, Neg liver ROS,,,  Endo/Other  negative endocrine ROS    Renal/GU Renal disease     Musculoskeletal negative musculoskeletal ROS (+)    Abdominal   Peds  Hematology  (+) Blood dyscrasia, anemia   Anesthesia Other Findings   Reproductive/Obstetrics                              Anesthesia Physical Anesthesia Plan  ASA: 3  Anesthesia Plan: General   Post-op Pain Management: Tylenol  PO (pre-op)* and Toradol  IV (intra-op)*   Induction: Intravenous  PONV Risk Score and Plan: 3 and Ondansetron  and Treatment may vary due to age or medical condition  Airway Management Planned: LMA  Additional Equipment: None  Intra-op Plan:   Post-operative Plan: Extubation in OR  Informed Consent: I have reviewed the patients History and Physical, chart, labs and discussed the procedure including the risks, benefits and alternatives for the proposed anesthesia with the patient or authorized representative who has indicated his/her understanding and acceptance.     Dental advisory given  Plan Discussed with: CRNA  Anesthesia Plan Comments: (See PAT note 10/14/23)         Anesthesia Quick Evaluation

## 2023-10-31 ENCOUNTER — Ambulatory Visit (HOSPITAL_BASED_OUTPATIENT_CLINIC_OR_DEPARTMENT_OTHER): Admitting: Anesthesiology

## 2023-10-31 ENCOUNTER — Encounter (HOSPITAL_COMMUNITY)
Admission: RE | Disposition: A | Discharge status: Home / Self Care | Payer: Self-pay | Source: Ambulatory Visit | Attending: General Surgery

## 2023-10-31 ENCOUNTER — Ambulatory Visit (HOSPITAL_COMMUNITY)
Admission: RE | Admit: 2023-10-31 | Discharge: 2023-10-31 | Disposition: A | Discharge status: Home / Self Care | Source: Ambulatory Visit | Attending: General Surgery | Admitting: General Surgery

## 2023-10-31 ENCOUNTER — Encounter (HOSPITAL_COMMUNITY): Payer: Self-pay | Admitting: General Surgery

## 2023-10-31 ENCOUNTER — Ambulatory Visit (HOSPITAL_COMMUNITY): Payer: Self-pay | Admitting: Medical

## 2023-10-31 ENCOUNTER — Other Ambulatory Visit: Payer: Self-pay

## 2023-10-31 DIAGNOSIS — I4811 Longstanding persistent atrial fibrillation: Secondary | ICD-10-CM | POA: Diagnosis not present

## 2023-10-31 DIAGNOSIS — K4090 Unilateral inguinal hernia, without obstruction or gangrene, not specified as recurrent: Secondary | ICD-10-CM | POA: Insufficient documentation

## 2023-10-31 DIAGNOSIS — I509 Heart failure, unspecified: Secondary | ICD-10-CM | POA: Diagnosis not present

## 2023-10-31 DIAGNOSIS — N1831 Chronic kidney disease, stage 3a: Secondary | ICD-10-CM | POA: Diagnosis not present

## 2023-10-31 DIAGNOSIS — I252 Old myocardial infarction: Secondary | ICD-10-CM | POA: Diagnosis not present

## 2023-10-31 DIAGNOSIS — F17290 Nicotine dependence, other tobacco product, uncomplicated: Secondary | ICD-10-CM | POA: Insufficient documentation

## 2023-10-31 DIAGNOSIS — Z7901 Long term (current) use of anticoagulants: Secondary | ICD-10-CM | POA: Diagnosis not present

## 2023-10-31 DIAGNOSIS — E785 Hyperlipidemia, unspecified: Secondary | ICD-10-CM | POA: Diagnosis not present

## 2023-10-31 DIAGNOSIS — G8918 Other acute postprocedural pain: Secondary | ICD-10-CM | POA: Diagnosis not present

## 2023-10-31 DIAGNOSIS — I25810 Atherosclerosis of coronary artery bypass graft(s) without angina pectoris: Secondary | ICD-10-CM | POA: Diagnosis not present

## 2023-10-31 DIAGNOSIS — Z01818 Encounter for other preprocedural examination: Secondary | ICD-10-CM

## 2023-10-31 DIAGNOSIS — I2510 Atherosclerotic heart disease of native coronary artery without angina pectoris: Secondary | ICD-10-CM | POA: Diagnosis not present

## 2023-10-31 HISTORY — PX: INGUINAL HERNIA REPAIR: SHX194

## 2023-10-31 SURGERY — REPAIR, HERNIA, INGUINAL, ADULT
Anesthesia: General | Site: Abdomen | Laterality: Left

## 2023-10-31 MED ORDER — CHLORHEXIDINE GLUCONATE 0.12 % MT SOLN
15.0000 mL | Freq: Once | OROMUCOSAL | Status: AC
  Administered 2023-10-31: 15 mL via OROMUCOSAL

## 2023-10-31 MED ORDER — ONDANSETRON HCL 4 MG/2ML IJ SOLN
INTRAMUSCULAR | Status: DC | PRN
  Administered 2023-10-31: 4 mg via INTRAVENOUS

## 2023-10-31 MED ORDER — CHLORHEXIDINE GLUCONATE CLOTH 2 % EX PADS: 6.0000 | MEDICATED_PAD | Freq: Once | CUTANEOUS | Status: DC

## 2023-10-31 MED ORDER — PHENYLEPHRINE 80 MCG/ML (10ML) SYRINGE FOR IV PUSH (FOR BLOOD PRESSURE SUPPORT)
PREFILLED_SYRINGE | INTRAVENOUS | Status: DC | PRN
Start: 2023-10-31 — End: 2023-10-31
  Administered 2023-10-31: 160 ug via INTRAVENOUS

## 2023-10-31 MED ORDER — FENTANYL CITRATE PF 50 MCG/ML IJ SOSY
50.0000 ug | PREFILLED_SYRINGE | Freq: Once | INTRAMUSCULAR | Status: DC
  Filled 2023-10-31: qty 2

## 2023-10-31 MED ORDER — OXYCODONE HCL 5 MG PO TABS
5.0000 mg | ORAL_TABLET | Freq: Once | ORAL | Status: AC
  Administered 2023-10-31: 5 mg via ORAL

## 2023-10-31 MED ORDER — FENTANYL CITRATE (PF) 100 MCG/2ML IJ SOLN
INTRAMUSCULAR | Status: AC
  Filled 2023-10-31: qty 2

## 2023-10-31 MED ORDER — LACTATED RINGERS IV SOLN
INTRAVENOUS | Status: DC
  Administered 2023-10-31: 1000 mL via INTRAVENOUS

## 2023-10-31 MED ORDER — BUPIVACAINE HCL (PF) 0.5 % IJ SOLN
INTRAMUSCULAR | Status: AC
  Filled 2023-10-31: qty 30

## 2023-10-31 MED ORDER — PHENYLEPHRINE HCL-NACL 20-0.9 MG/250ML-% IV SOLN
INTRAVENOUS | Status: DC | PRN
  Administered 2023-10-31: 30 ug/min via INTRAVENOUS

## 2023-10-31 MED ORDER — ORAL CARE MOUTH RINSE: 15.0000 mL | Freq: Once | OROMUCOSAL | Status: AC

## 2023-10-31 MED ORDER — ACETAMINOPHEN 500 MG PO TABS
1000.0000 mg | ORAL_TABLET | ORAL | Status: AC
  Administered 2023-10-31: 1000 mg via ORAL
  Filled 2023-10-31: qty 2

## 2023-10-31 MED ORDER — BUPIVACAINE-EPINEPHRINE (PF) 0.5% -1:200000 IJ SOLN
INTRAMUSCULAR | Status: DC | PRN
  Administered 2023-10-31: 20 mL

## 2023-10-31 MED ORDER — BUPIVACAINE HCL 0.5 % IJ SOLN
INTRAMUSCULAR | Status: DC | PRN
  Administered 2023-10-31: 21 mL

## 2023-10-31 MED ORDER — PROPOFOL 10 MG/ML IV BOLUS
INTRAVENOUS | Status: AC
  Filled 2023-10-31: qty 20

## 2023-10-31 MED ORDER — PHENYLEPHRINE 80 MCG/ML (10ML) SYRINGE FOR IV PUSH (FOR BLOOD PRESSURE SUPPORT)
PREFILLED_SYRINGE | INTRAVENOUS | Status: AC
  Filled 2023-10-31: qty 10

## 2023-10-31 MED ORDER — PROPOFOL 10 MG/ML IV BOLUS
INTRAVENOUS | Status: DC | PRN
  Administered 2023-10-31: 120 mg via INTRAVENOUS
  Administered 2023-10-31: 80 mg via INTRAVENOUS

## 2023-10-31 MED ORDER — FENTANYL CITRATE (PF) 100 MCG/2ML IJ SOLN
INTRAMUSCULAR | Status: DC | PRN
  Administered 2023-10-31 (×2): 50 ug via INTRAVENOUS

## 2023-10-31 MED ORDER — DEXAMETHASONE SODIUM PHOSPHATE 10 MG/ML IJ SOLN
INTRAMUSCULAR | Status: DC | PRN
Start: 2023-10-31 — End: 2023-10-31
  Administered 2023-10-31: 8 mg via INTRAVENOUS

## 2023-10-31 MED ORDER — LACTATED RINGERS IV SOLN: INTRAVENOUS | Status: DC | PRN

## 2023-10-31 MED ORDER — OXYCODONE HCL 5 MG PO TABS
ORAL_TABLET | ORAL | Status: AC
  Filled 2023-10-31: qty 1

## 2023-10-31 MED ORDER — LIDOCAINE HCL (CARDIAC) PF 100 MG/5ML IV SOSY
PREFILLED_SYRINGE | INTRAVENOUS | Status: DC | PRN
  Administered 2023-10-31: 40 mg via INTRAVENOUS

## 2023-10-31 MED ORDER — 0.9 % SODIUM CHLORIDE (POUR BTL) OPTIME
TOPICAL | Status: DC | PRN
  Administered 2023-10-31: 1000 mL

## 2023-10-31 MED ORDER — TRAMADOL HCL 50 MG PO TABS: 50.0000 mg | ORAL_TABLET | Freq: Four times a day (QID) | ORAL | 0 refills | Status: DC | PRN

## 2023-10-31 MED ORDER — CEFAZOLIN SODIUM-DEXTROSE 2-4 GM/100ML-% IV SOLN
2.0000 g | INTRAVENOUS | Status: AC
  Administered 2023-10-31: 2 g via INTRAVENOUS
  Filled 2023-10-31: qty 100

## 2023-10-31 SURGICAL SUPPLY — 36 items
BAG COUNTER SPONGE SURGICOUNT (BAG) ×1 IMPLANT
BENZOIN TINCTURE PRP APPL 2/3 (GAUZE/BANDAGES/DRESSINGS) IMPLANT
BLADE SURG 15 STRL LF DISP TIS (BLADE) ×1 IMPLANT
CHLORAPREP W/TINT 26 (MISCELLANEOUS) ×1 IMPLANT
COVER SURGICAL LIGHT HANDLE (MISCELLANEOUS) ×1 IMPLANT
DERMABOND ADVANCED .7 DNX12 (GAUZE/BANDAGES/DRESSINGS) ×1 IMPLANT
DRAIN PENROSE 0.5X18 (DRAIN) IMPLANT
DRAPE LAPAROTOMY TRNSV 102X78 (DRAPES) ×1 IMPLANT
DRAPE UTILITY XL STRL (DRAPES) ×1 IMPLANT
DRSG TELFA PLUS 4X6 ADH ISLAND (GAUZE/BANDAGES/DRESSINGS) IMPLANT
ELECT REM PT RETURN 15FT ADLT (MISCELLANEOUS) ×1 IMPLANT
GAUZE SPONGE 4X4 12PLY STRL (GAUZE/BANDAGES/DRESSINGS) IMPLANT
GLOVE BIOGEL PI IND STRL 7.0 (GLOVE) IMPLANT
GLOVE SURG SS PI 7.0 STRL IVOR (GLOVE) ×1 IMPLANT
GOWN STRL REUS W/ TWL XL LVL3 (GOWN DISPOSABLE) ×1 IMPLANT
KIT BASIN OR (CUSTOM PROCEDURE TRAY) ×1 IMPLANT
KIT TURNOVER KIT A (KITS) ×1 IMPLANT
MARKER SKIN DUAL TIP RULER LAB (MISCELLANEOUS) ×1 IMPLANT
MESH HERNIA 3X6 (Mesh General) IMPLANT
NDL HYPO 22X1.5 SAFETY MO (MISCELLANEOUS) ×1 IMPLANT
NEEDLE HYPO 22X1.5 SAFETY MO (MISCELLANEOUS) ×1 IMPLANT
PACK BASIC VI WITH GOWN DISP (CUSTOM PROCEDURE TRAY) ×1 IMPLANT
PENCIL SMOKE EVACUATOR (MISCELLANEOUS) ×1 IMPLANT
SPIKE FLUID TRANSFER (MISCELLANEOUS) ×1 IMPLANT
SPONGE T-LAP 4X18 ~~LOC~~+RFID (SPONGE) ×1 IMPLANT
STRIP CLOSURE SKIN 1/2X4 (GAUZE/BANDAGES/DRESSINGS) IMPLANT
SUT MNCRL AB 4-0 PS2 18 (SUTURE) ×1 IMPLANT
SUT PDS AB 2-0 CT2 27 (SUTURE) ×1 IMPLANT
SUT PROLENE 2 0 CT2 30 (SUTURE) ×2 IMPLANT
SUT VIC AB 3-0 SH 18 (SUTURE) IMPLANT
SUT VIC AB 3-0 SH 27XBRD (SUTURE) IMPLANT
SUT VICRYL 3-0 CR8 SH (SUTURE) ×1 IMPLANT
SYR BULB IRRIG 60ML STRL (SYRINGE) ×1 IMPLANT
SYR CONTROL 10ML LL (SYRINGE) ×1 IMPLANT
TOWEL OR 17X26 10 PK STRL BLUE (TOWEL DISPOSABLE) ×1 IMPLANT
YANKAUER SUCT BULB TIP 10FT TU (MISCELLANEOUS) IMPLANT

## 2023-10-31 NOTE — H&P (Signed)
 Chief Complaint  Patient presents with  New Consultation  Hernia   Subjective   Donald [name] is a 78 y.o. male new patient in today for: History of Present Illness Donald [name] is a 78 year old male with a history of hernia repair and atrial fibrillation who presents with abdominal pain and a suspected hernia.  A persistent lump in the abdomen was noticed about a month ago, primarily causing discomfort in the abdominal area. The discomfort is associated with lifting heavy objects, suggesting a possible strain or hernia. There is a history of hernia repair on the right side in 2014.  Dizziness and low blood pressure occur during physical exertion, attributed to his heart condition. He is on Eliquis  for atrial fibrillation. Despite these symptoms, daily activities are not significantly hindered.  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 No diabetes is present. No acute, severe pain is reported, but there is a 'nagging' sensation in the abdominal area.  Social Drivers of Health with Concerns   Tobacco Use: High Risk (07/05/2023)  Patient History  Smoking Tobacco Use: Never  Smokeless Tobacco Use: Current  Social Connections: Moderately Isolated (07/11/2022)  Received from [facility]  Social Connection and Isolation Panel [NHANES]  Frequency of Communication with Friends and Family: More than three times a week  Frequency of Social Gatherings with Friends and Family: More than three times a week  Attends Religious Services: Never  Database administrator or Organizations: No  Attends Banker Meetings: Never  Marital Status: Married  Housing Stability: Unknown (07/05/2023)  Housing Stability Vital Sign  Homeless in the Last Year: No    No data to display      Outpatient Medications Prior to Visit  Medication Sig Dispense Refill  AMIOdarone  (PACERONE ) 200 MG tablet Take 1 tablet by mouth once  daily  apixaban  (ELIQUIS ) 5 mg tablet Take 1 tablet by mouth 2 (two) times daily  atorvastatin  (LIPITOR) 40 MG tablet Take 40 mg by mouth once daily  cephalexin  (KEFLEX ) 500 MG capsule TAKE 2 CAPSULES BY MOUTH TWO HOURS PRIOR TO DENTAL PROCEDURE, THEN TAKE 1 CAPSULE EVERY 6 HOURS UNTIL GONE  midodrine  (PROAMATINE ) 5 MG tablet Take 1 tablet by mouth 3 (three) times daily with meals  multivitamin tablet Take by mouth  zinc gluconate 50 mg tablet Take 1 tablet by mouth daily with lunch   No facility-administered medications prior to visit.   Review of Systems  Constitutional: Negative.  HENT: Negative.  Eyes: Negative.  Respiratory: Negative.  Cardiovascular: Negative.  Gastrointestinal: Negative.  Genitourinary: Negative.  Musculoskeletal: Negative.  Skin: Negative.  Neurological: Negative.  Endo/Heme/Allergies: Negative.  Psychiatric/Behavioral: Negative.    Objective   Vitals:  07/05/23 0951  BP: 118/72  Pulse: 86  Temp: 36.1 C (97 F)  SpO2: 96%  Weight: 69.4 kg (153 lb)  Height: 160 cm (5' 3)   Body mass index is 27.1 kg/m. Physical Exam Constitutional:  Appearance: Normal appearance.  HENT:  Head: Normocephalic and atraumatic.  Pulmonary:  Effort: Pulmonary effort is normal.  Abdominal:  Comments: Moderate left inguinal hernia, reducible  Musculoskeletal:  General: Normal range of motion.  Cervical back: Normal range of motion.  Neurological:  General: No focal deficit present.  Mental Status: He is alert and oriented to person, place, and time. Mental status is at baseline.  Psychiatric:  Mood and Affect: Mood normal.  Behavior: Behavior normal.  Thought Content:  Thought content normal.   I reviewed CT images showing concern for bilateral inguinal hernias without acute inflammatory changes or bowel in hernia. Images also show diverticula of the colon without inflammatory changes. Images also show small umbilical hernia. I reviewed notes by Avinash Pasam  showing MGUS which is stable also with history of CABG in 2001. I reviewed notes by Charlies Bellini with concerns for left inguinal hernia bulge.  Assessment/Plan:   Assessment & Plan Inguinal hernia Left-sided inguinal hernia with bulge and discomfort, exacerbated by activity. Surgical repair with mesh planned to reduce recurrence risk. Mesh placement reduces bowel complication risk. Surgery outpatient with general anesthesia and regional block. Recovery involves soreness for a week, gradual return to activities over eight weeks. Recurrence risk 16% without mesh, 1% with mesh. 1% risk of treatable nerve irritation with mesh. - Coordinate with cardiologist for preoperative clearance and optimization of heart condition. We discussed etiology of hernias and how they can cause pain. We discussed options for inguinal hernia repair vs observation. We discussed details of the surgery of general anesthesia, surgical approach and incisions, dissecting the sack away from vas deference, testicular vessels and nerves and placement of mesh. We discussed risks of bleeding, infection, recurrence, injury to vas deference, testicular vessels, nerve injury, and chronic pain. He showed good understanding and wanted proceed with open left inguinal hernia repair as outpatient.   - Instruct to stop Eliquis  48 hours before surgery with cardiologist approval. - Advise stretching and massaging the left flank until surgery. - Discuss postoperative care and activity restrictions, including gradual return to normal activities over eight weeks.  Atrial fibrillation Atrial fibrillation managed with Eliquis . Cardiologist involvement needed to optimize heart condition prior to hernia surgery. Stopping Eliquis  48 hours before surgery with cardiologist approval is crucial to minimize bleeding risk. - Coordinate with cardiologist to optimize atrial fibrillation management before surgery. - Ensure cardiologist approves stopping Eliquis   48 hours before surgery.  Diagnoses and all orders for this visit:  Non-recurrent unilateral inguinal hernia without obstruction or gangrene  Longstanding persistent atrial fibrillation (CMS/HHS-HCC)  Coronary artery disease involving coronary bypass graft of native heart without angina pectoris

## 2023-10-31 NOTE — Op Note (Signed)
 Preop diagnosis: left inguinal hernia  Postop diagnosis: left pantaloon type inguinal hernia  Procedure: open Left inguinal hernia repair with mesh  Surgeon: Herlene Bureau, M.D.  Asst: Starleen Barrier, M.D.  Anesthesia: Gen.   Indications for procedure: Donald [name] is a 78 y.o. male with symptoms of pain and enlarging Left inguinal hernia(s). After discussing risks, alternatives and benefits he decided on open repair and was brought to day surgery for repair.  Description of procedure: The patient was brought into the operative suite, placed supine. Anesthesia was administered with endotracheal tube. Patient was strapped in place. The patient was prepped and draped in the usual sterile fashion.  The anterior superior iliac spine and pubic tubercle were identified on the Left side. An incision was made 1cm above the connecting line, representative of the location of the inguinal ligament. The subcutaneous tissue was bluntly dissected, scarpa's fascia was dissected away. The external abdominal oblique fascia was identified and sharply opened down to the external inguinal ring. The conjoint tendon and inguinal ligament were identified. The cord structures and sac were dissected free of the surrounding tissue in 360 degrees. A penrose drain was used to encircle the contents. The cremasteric fibers were dissected free of the contents of the cord and hernia sac. The cord structures (vessels and vas deferens) were identified and carefully dissected away from the hernia sac. The hernia sac was reduced and contained no visceral structures. The hernia sac was dissected down to the internal inguinal ring. Preperitoneal fat was identified showing appropriate dissection. The sac was then reduced into the preperitoneal space. There was in addition a moderate direct inguinal hernia. It was also reduced.  The canal floor was large and closed with interrupted 2-0 PDS apposing the conjoint tendon to the inguinal  ligament medially. A 3x6 Bard mesh was then used to close the defect and reinforce the floor. The mesh was sutured to the lacunar ligament and inguinal ligament using a 2-0 prolene in running fashion. Next the superior edge of the mesh was sutured to the conjoined tendon using a 2-0 running Prolene. An additional 2-0 Prolene was used to suture the tail ends of the mesh together re-creating the deep ring. Cord structures are running in a neutral position through the mesh. Next the external abdominal oblique fascia was closed with a 2-0 Vicryl in interrupted fashion to re-create the external inguinal ring. Scarpa's fascia was closed with 3-0 Vicryl in running fashion. Skin was closed with a 4-0 Monocryl subcuticular stitch in running fashion. Dermabond place for dressing. Patient woke from anesthesia and brought to PACU in stable condition. All counts are correct.  Findings: left pantaloon inguinal hernia  Specimen: none  Blood loss: 10 ml  Local anesthesia: none  Complications: none  Implant: 3 x 6 in Bard mesh cut down to 6 x 13 cm  Herlene Bureau, M.D. General, Bariatric, & Minimally Invasive Surgery [facility], [place] 3:08 PM 10/31/2023

## 2023-10-31 NOTE — Transfer of Care (Signed)
 Immediate Anesthesia Transfer of Care Note  Patient: Donald [name]  Procedure(s) Performed: REPAIR, HERNIA, INGUINAL, ADULT (Left: Abdomen)  Patient Location: PACU  Anesthesia Type:General  Level of Consciousness: drowsy and patient cooperative  Airway & Oxygen Therapy: Patient Spontanous Breathing  Post-op Assessment: Report given to RN and Post -op Vital signs reviewed and stable  Post vital signs: Reviewed and stable  Last Vitals:  Vitals Value Taken Time  BP    Temp    Pulse 68 10/31/23 15:26  Resp 19 10/31/23 15:26  SpO2 97 % 10/31/23 15:26  Vitals shown include unfiled device data.  Last Pain:  Vitals:   10/31/23 1220  TempSrc:   PainSc: 0-No pain         Complications: No notable events documented.

## 2023-10-31 NOTE — Discharge Instructions (Signed)

## 2023-10-31 NOTE — Anesthesia Postprocedure Evaluation (Signed)
 Anesthesia Post Note  Patient: Donald [name]  Procedure(s) Performed: REPAIR, HERNIA, INGUINAL, ADULT (Left: Abdomen)     Patient location during evaluation: PACU Anesthesia Type: General Level of consciousness: awake and alert Pain management: pain level controlled Vital Signs Assessment: post-procedure vital signs reviewed and stable Respiratory status: spontaneous breathing, nonlabored ventilation, respiratory function stable and patient connected to nasal cannula oxygen Cardiovascular status: blood pressure returned to baseline and stable Postop Assessment: no apparent nausea or vomiting Anesthetic complications: no   No notable events documented.  Last Vitals:  Vitals:   10/31/23 1607 10/31/23 1614  BP:    Pulse:  (!) 54  Resp:    Temp: 36.9 C   SpO2:  99%    Last Pain:  Vitals:   10/31/23 1600  TempSrc:   PainSc: 0-No pain                 [name]

## 2023-10-31 NOTE — Anesthesia Procedure Notes (Signed)
 Anesthesia Regional Block: TAP block   Pre-Anesthetic Checklist: , timeout performed,  Correct Patient, Correct Site, Correct Laterality,  Correct Procedure, Correct Position, site marked,  Risks and benefits discussed,  Surgical consent,  Pre-op evaluation,  At surgeon's request and post-op pain management  Laterality: Left  Prep: chloraprep       Needles:  Injection technique: Single-shot  Needle Type: Echogenic Stimulator Needle     Needle Length: 9cm  Needle Gauge: 21     Additional Needles:   Procedures:,,,, ultrasound used (permanent image in chart),,    Narrative:  Start time: 10/31/2023 1:45 PM End time: 10/31/2023 1:50 PM Injection made incrementally with aspirations every 5 mL.  Performed by: Personally  Anesthesiologist: Tilford Franky BIRCH, MD  Additional Notes: Discussed risks and benefits of the nerve block in detail, including but not limited vascular injury, permanent nerve damage and infection.   Patient tolerated the procedure well. Local anesthetic introduced in an incremental fashion under minimal resistance after negative aspirations. No paresthesias were elicited. After completion of the procedure, no acute issues were identified and patient continued to be monitored by RN.

## 2023-10-31 NOTE — Anesthesia Procedure Notes (Signed)
 Procedure Name: LMA Insertion Date/Time: 10/31/2023 2:17 PM  Performed by: Deeann Eva BROCKS, CRNAPre-anesthesia Checklist: Patient identified, Emergency Drugs available, Suction available and Patient being monitored Patient Re-evaluated:Patient Re-evaluated prior to induction Oxygen Delivery Method: Circle System Utilized Preoxygenation: Pre-oxygenation with 100% oxygen Induction Type: IV induction Ventilation: Mask ventilation without difficulty LMA: LMA inserted LMA Size: 4.0 Number of attempts: 1 Airway Equipment and Method: Bite block Placement Confirmation: positive ETCO2 Tube secured with: Tape Dental Injury: Teeth and Oropharynx as per pre-operative assessment

## 2023-11-01 ENCOUNTER — Encounter (HOSPITAL_COMMUNITY): Payer: Self-pay | Admitting: General Surgery

## 2023-11-11 NOTE — Progress Notes (Unsigned)
 Cardiology Office Note:    Date:  11/11/2023   ID:  Donald [name], DOB [birth year], MRN [id number]  PCP:  Donald Charlies LABOR, DO  Cardiologist:  Candyce Reek, MD  Electrophysiologist:  Lynwood Rakers, MD (Inactive)   Referring MD: Donald Charlies LABOR, DO   No chief complaint on file. ***  History of Present Illness:    Donald [name] is a 78 y.o. male with a hx of CAD status post CABG in 2001, renal cancer status post left nephrectomy, CKD stage IIIb, paroxysmal atrial fibrillation/flutter status post ablation in 2006 and 2011, hypotension requiring midodrine  who presents for follow-up.  Echocardiogram 12/02/2020 showed EF 70 to 75%, normal RV function, no significant valvular disease.  Zio patch x 14 days 12/2022 showed 1 pause lasting 4.4 seconds (occurred at 6:37 AM) no symptoms reported, patient triggered events corresponded to sinus rhythm.  PYP scan 03/27/2023 was equivocal.  Cardiac MRI 05/10/2023 showed no evidence of cardiac amyloidosis, normal biventricular size and systolic function, no LGE.  Carotid duplex 06/2023 showed mild bilateral carotid stenosis.  Since last clinic visit,  Past Medical History:  Diagnosis Date   Age-related nuclear cataract, bilateral 06/29/2020   Allergic rhinitis    BPH (benign prostatic hyperplasia)    CHF (congestive heart failure) (HCC)    Chicken pox    Coronary artery disease    a. MI 2003 with CABG.   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    Renal cell carcinoma (HCC) 02/04/2019   Partial nephrectomyKIDNEY, LEFT, PARTIAL NEPHRECTOMY:    Right ureteral stone    S/P ablation of atrial flutter    followed by dr rakers (EP cardiolgoist)  ablation 04-17-2004   and by dr allred 02-10- 2011 was successful   S/P CABG x 5 08-05-2001   in [place] , [place]   LIMA to LAD,  seqVG to Diagonal,  SeqVG to OM,  seqVG to PDA and PLA   Seasonal allergies    Sinus bradycardia    Wears glasses     Past Surgical History:  Procedure Laterality Date   CARDIAC CATHETERIZATION  08-04-2001  in [place], [place]   severe 3 vessel cad   CARDIAC ELECTROPHYSIOLOGY MAPPING AND ABLATION  x2  04-17-2004 [place], [place]);  04-14-2009 by dr rakers   per dr allred report-- successful ablation clockwise isthmus-dependent right atrial flutter along the usual cavotricupid isthmus;  complete bidirectionl isthmus block achieved   CORONARY ARTERY BYPASS GRAFT  08-06-2003  in [place], [place]   x5-- LIMA - LAD,  seqVG to Diagonal, OM, PDA, and PLA   CYSTOSCOPY WITH RETROGRADE PYELOGRAM, URETEROSCOPY AND STENT PLACEMENT Right 08/10/2016   Procedure: CYSTOSCOPY WITH RETROGRADE PYELOGRAM, URETEROSCOPY AND STENT PLACEMENT;  Surgeon: Alvaro Hummer, MD;  Location: [facility];  Service: Urology;  Laterality: Right;   FIXATION RIGHT WRIST Right 2011   stability for arthritis   INGUINAL HERNIA REPAIR Right 08-08-2015   [facility]- [facility] care at [facility]   INGUINAL HERNIA REPAIR Left 10/31/2023   Procedure: REPAIR, HERNIA, INGUINAL, ADULT;  Surgeon: Kinsinger, Herlene Righter, MD;  Location: [facility];  Service: General;  Laterality: Left;  GEN/TAP  BLOCK   ROBOTIC ASSITED PARTIAL NEPHRECTOMY Left 02/04/2019   Procedure: XI ROBOTIC ASSITED LEFT PARTIAL NEPHRECTOMY; LEFT PARTIAL ADRENALECTOMY;  Surgeon: Alvaro Hummer, MD;  Location: [facility];  Service: Urology;  Laterality: Left;  3 HRS   ROTATOR CUFF REPAIR Right 2004;  2007   TOTAL KNEE ARTHROPLASTY Left 11/30/2020   Procedure: TOTAL KNEE ARTHROPLASTY;  Surgeon: Duwayne Purchase, MD;  Location: [facility];  Service: Orthopedics;  Laterality: Left;   WISDOM TOOTH EXTRACTION      Current Medications: No outpatient medications have been marked as taking for the  11/14/23 encounter (Appointment) with Kate Lonni CROME, MD.     Allergies:   Patient has no known allergies.   Social History   Socioeconomic History   Marital status: Married    Spouse name: Not on file   Number of children: Not on file   Years of education: Not on file   Highest education level: Not on file  Occupational History   Occupation: retired  Tobacco Use   Smoking status: Never    Passive exposure: Past   Smokeless tobacco: Current    Types: Snuff   Tobacco comments:    03/16/2021 ~ dip tobacco for 30 yrs  Vaping Use   Vaping status: Never Used  Substance and Sexual Activity   Alcohol use: Not Currently    Comment: occ.    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; Audiological scientist Situation: lives with wife      Spiritual Beliefs: Christian      Lifestyle: tries to make it to the gym 5 days per week; healthy diet     -smoke alarm in the home:Yes     - wears seatbelt: Yes     - Feels safe in their relationships: Yes         Social Drivers of Corporate investment banker Strain: Low Risk  (09/11/2023)   Overall Financial Resource Strain (CARDIA)    Difficulty of Paying Living Expenses: Not hard at all  Food Insecurity: No Food Insecurity (09/11/2023)   Hunger Vital Sign    Worried About Running Out of Food in the Last Year: Never true    Ran Out of Food in the Last Year: Never true  Transportation Needs: No Transportation Needs (09/11/2023)   PRAPARE - Administrator, Civil Service (Medical): No    Lack of Transportation (Non-Medical): No  Physical Activity: Sufficiently Active (09/11/2023)   Exercise Vital Sign    Days of Exercise per Week: 4 days    Minutes of Exercise per Session: 50 min  Stress: No Stress Concern Present (09/11/2023)   [employer] of Occupational Health - Occupational  Stress Questionnaire    Feeling of Stress: Not at all  Social Connections: Unknown (09/11/2023)   Social Connection and Isolation Panel    Frequency of Communication with Friends and Family: More than three times a week    Frequency of Social Gatherings with Friends and Family: More than three times a week    Attends Religious Services: Never    Database administrator or Organizations: No    Attends Engineer, structural: More than 4 times per year    Marital Status: Patient declined     Family History: The patient's ***  family history includes Alzheimer's disease in his mother; Cancer in his father; Heart attack in his mother. There is no history of Hypertension.  ROS:   Please see the history of present illness.    *** All other systems reviewed and are negative.  EKGs/Labs/Other Studies Reviewed:    The following studies were reviewed today: ***  EKG:  EKG is *** ordered today.  The ekg ordered today demonstrates ***  Recent Labs: 12/06/2022: TSH 0.831 06/10/2023: ALT 20 10/14/2023: BUN 26; Creatinine, Ser 1.57; Hemoglobin 11.8; Platelets 208; Potassium 4.8; Sodium 138  Recent Lipid Panel    Component Value Date/Time   HDL 49 05/20/2014 0000   LDLCALC 59 05/20/2014 0000    Physical Exam:    VS:  There were no vitals taken for this visit.    Wt Readings from Last 3 Encounters:  10/31/23 155 lb (70.3 kg)  10/14/23 147 lb (66.7 kg)  09/11/23 152 lb (68.9 kg)     GEN: *** Well nourished, well developed in no acute distress HEENT: Normal NECK: No JVD; No carotid bruits LYMPHATICS: No lymphadenopathy CARDIAC: ***RRR, no murmurs, rubs, gallops RESPIRATORY:  Clear to auscultation without rales, wheezing or rhonchi  ABDOMEN: Soft, non-tender, non-distended MUSCULOSKELETAL:  No edema; No deformity  SKIN: Warm and dry NEUROLOGIC:  Alert and oriented x 3 PSYCHIATRIC:  Normal affect   ASSESSMENT:    No diagnosis found. PLAN:    CAD: s/p CABG in 2001.  Denies  anginal symptoms - Continue Eliquis , statin  CKD stage IIIb: h/o Renal cancer status post left nephrectomy  Paroxysmal atrial fibrillation/flutter:status post ablation in 2006 and 2011. - Continue Eliquis .  Reduce dose to 2.5 mg twice daily based on age/renal function*** - Continue amiodarone  100 mg daily  Orthostatic hypotension: Currently on midodrine  7.5 mg 3 times daily.  Orthostatics in clinic today show***  RTC in***  Medication Adjustments/Labs and Tests Ordered: Current medicines are reviewed at length with the patient today.  Concerns regarding medicines are outlined above.  No orders of the defined types were placed in this encounter.  No orders of the defined types were placed in this encounter.   There are no Patient Instructions on file for this visit.   Signed, Lonni LITTIE Nanas, MD  11/11/2023 11:42 AM    [facility]

## 2023-11-14 ENCOUNTER — Encounter: Payer: Self-pay | Admitting: Cardiology

## 2023-11-14 ENCOUNTER — Ambulatory Visit: Attending: Cardiology | Admitting: Cardiology

## 2023-11-14 VITALS — BP 162/74 | HR 55 | Ht 64.0 in | Wt 153.2 lb

## 2023-11-14 DIAGNOSIS — N1832 Chronic kidney disease, stage 3b: Secondary | ICD-10-CM

## 2023-11-14 DIAGNOSIS — Z79899 Other long term (current) drug therapy: Secondary | ICD-10-CM

## 2023-11-14 DIAGNOSIS — I951 Orthostatic hypotension: Secondary | ICD-10-CM | POA: Diagnosis not present

## 2023-11-14 DIAGNOSIS — I2510 Atherosclerotic heart disease of native coronary artery without angina pectoris: Secondary | ICD-10-CM | POA: Diagnosis not present

## 2023-11-14 DIAGNOSIS — I480 Paroxysmal atrial fibrillation: Secondary | ICD-10-CM | POA: Diagnosis not present

## 2023-11-14 MED ORDER — MIDODRINE HCL 5 MG PO TABS: 5.0000 mg | ORAL_TABLET | Freq: Three times a day (TID) | ORAL | 3 refills | Status: DC

## 2023-11-14 NOTE — Patient Instructions (Addendum)
 Medication Instructions:  Your physician has recommended you make the following change in your medication:  DECREASE: Midodrine  5 mg three times daily  *If you need a refill on your cardiac medications before your next appointment, please call your pharmacy*  Lab Work: CMET, TSH, CBC If you have labs (blood work) drawn today and your tests are completely normal, you will receive your results only by: MyChart Message (if you have MyChart) OR A paper copy in the mail If you have any lab test that is abnormal or we need to change your treatment, we will call you to review the results.  Follow-Up: At [facility], you and your health needs are our priority.  As part of our continuing mission to provide you with exceptional heart care, our providers are all part of one team.  This team includes your primary Cardiologist (physician) and Advanced Practice Providers or APPs (Physician Assistants and Nurse Practitioners) who all work together to provide you with the care you need, when you need it.  Your next appointment:   4 month(s)  Provider:   Lonni LITTIE Nanas, MD

## 2023-11-15 ENCOUNTER — Ambulatory Visit: Payer: Self-pay | Admitting: Cardiology

## 2023-11-15 LAB — TSH: TSH: 0.624 u[IU]/mL (ref 0.450–4.500)

## 2023-11-15 LAB — COMPREHENSIVE METABOLIC PANEL WITH GFR
ALT: 17 IU/L (ref 0–44)
AST: 16 IU/L (ref 0–40)
Albumin: 4 g/dL (ref 3.8–4.8)
Alkaline Phosphatase: 96 IU/L (ref 44–121)
BUN/Creatinine Ratio: 17 (ref 10–24)
BUN: 23 mg/dL (ref 8–27)
Bilirubin Total: 0.2 mg/dL (ref 0.0–1.2)
CO2: 23 mmol/L (ref 20–29)
Calcium: 9 mg/dL (ref 8.6–10.2)
Chloride: 107 mmol/L — ABNORMAL HIGH (ref 96–106)
Creatinine, Ser: 1.35 mg/dL — ABNORMAL HIGH (ref 0.76–1.27)
Globulin, Total: 2.2 g/dL (ref 1.5–4.5)
Glucose: 91 mg/dL (ref 70–99)
Potassium: 4.9 mmol/L (ref 3.5–5.2)
Sodium: 143 mmol/L (ref 134–144)
Total Protein: 6.2 g/dL (ref 6.0–8.5)
eGFR: 53 mL/min/1.73 — ABNORMAL LOW (ref >59)

## 2023-11-15 LAB — CBC
Hematocrit: 37.4 % — ABNORMAL LOW (ref 37.5–51.0)
Hemoglobin: 11.9 g/dL — ABNORMAL LOW (ref 13.0–17.7)
MCH: 30.5 pg (ref 26.6–33.0)
MCHC: 31.8 g/dL (ref 31.5–35.7)
MCV: 96 fL (ref 79–97)
Platelets: 238 x10E3/uL (ref 150–450)
RBC: 3.9 x10E6/uL — ABNORMAL LOW (ref 4.14–5.80)
RDW: 11.9 % (ref 11.6–15.4)
WBC: 8 x10E3/uL (ref 3.4–10.8)

## 2023-12-05 DIAGNOSIS — L820 Inflamed seborrheic keratosis: Secondary | ICD-10-CM | POA: Diagnosis not present

## 2023-12-05 DIAGNOSIS — L812 Freckles: Secondary | ICD-10-CM | POA: Diagnosis not present

## 2023-12-05 DIAGNOSIS — L570 Actinic keratosis: Secondary | ICD-10-CM | POA: Diagnosis not present

## 2023-12-05 DIAGNOSIS — D1801 Hemangioma of skin and subcutaneous tissue: Secondary | ICD-10-CM | POA: Diagnosis not present

## 2023-12-05 DIAGNOSIS — Z85828 Personal history of other malignant neoplasm of skin: Secondary | ICD-10-CM | POA: Diagnosis not present

## 2023-12-05 DIAGNOSIS — L821 Other seborrheic keratosis: Secondary | ICD-10-CM | POA: Diagnosis not present

## 2023-12-09 ENCOUNTER — Other Ambulatory Visit: Payer: Self-pay | Admitting: Physician Assistant

## 2023-12-16 DIAGNOSIS — M25542 Pain in joints of left hand: Secondary | ICD-10-CM | POA: Diagnosis not present

## 2024-01-27 ENCOUNTER — Ambulatory Visit (INDEPENDENT_AMBULATORY_CARE_PROVIDER_SITE_OTHER)

## 2024-01-27 DIAGNOSIS — Z23 Encounter for immunization: Secondary | ICD-10-CM | POA: Diagnosis not present

## 2024-03-01 NOTE — Progress Notes (Unsigned)
 " Cardiology Office Note:    Date:  03/01/2024   ID:  Donald [name], DOB January 06, 1946, MRN [id number]  PCP:  Catherine Charlies LABOR, DO  Cardiologist:  Lonni LITTIE Nanas, MD  Electrophysiologist:  Lynwood Rakers, MD (Inactive)   Referring MD: Catherine Charlies LABOR, DO   No chief complaint on file.   History of Present Illness:    Donald [name] is a 78 y.o. male with a hx of CAD status post CABG in 2001, renal cancer status post left nephrectomy, CKD stage IIIb, paroxysmal atrial fibrillation/flutter status post ablation in 2006 and 2011, hypotension requiring midodrine  who presents for follow-up.  Echocardiogram 12/02/2020 showed EF 70 to 75%, normal RV function, no significant valvular disease.  Zio patch x 14 days 12/2022 showed 1 pause lasting 4.4 seconds (occurred at 6:37 AM) no symptoms reported, patient triggered events corresponded to sinus rhythm.  PYP scan 03/27/2023 was equivocal.  Cardiac MRI 05/10/2023 showed no evidence of cardiac amyloidosis, normal biventricular size and systolic function, no LGE.  Carotid duplex 06/2023 showed mild bilateral carotid stenosis.  Since last clinic visit,  he reports he is doing better.  States that when it was hot out he was having issues with his orthostatic hypotension, states that his BP would drop low with any exertion.  Would have SBP down to 80s.  States that he feels much better since weather has gotten cooler.  Reports BP has recently been normal.  He denies any chest pain or dyspnea.  Denies any syncope, but does report he has come close.  Denies any lower extremity edema or palpitations.  He denies any bleeding on Eliquis .   Past Medical History:  Diagnosis Date   Age-related nuclear cataract, bilateral 06/29/2020   Allergic rhinitis    BPH (benign prostatic hyperplasia)    CHF (congestive heart failure) (HCC)    Chicken pox    Coronary artery disease    a. MI 2003 with CABG.   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    Renal cell carcinoma (HCC) 02/04/2019   Partial nephrectomyKIDNEY, LEFT, PARTIAL NEPHRECTOMY:    Right ureteral stone    S/P ablation of atrial flutter    followed by dr rakers (EP cardiolgoist)  ablation 04-17-2004  and by dr allred 02-10- 2011 was successful   S/P CABG x 5 08-05-2001   in [place] , [place]   LIMA to LAD,  seqVG to Diagonal,  SeqVG to OM,  seqVG to PDA and PLA   Seasonal allergies    Sinus bradycardia    Wears glasses     Past Surgical History:  Procedure Laterality Date   CARDIAC CATHETERIZATION  08-04-2001  in [place], [place]   severe 3 vessel cad   CARDIAC ELECTROPHYSIOLOGY MAPPING AND ABLATION  x2  04-17-2004 [place], [place]);  04-14-2009 by dr rakers   per dr allred report-- successful ablation clockwise isthmus-dependent right atrial flutter along the usual cavotricupid isthmus;  complete bidirectionl isthmus block achieved   CORONARY ARTERY BYPASS GRAFT  08-06-2003  in [place], [place]   x5-- LIMA - LAD,  seqVG to Diagonal, OM, PDA, and PLA   CYSTOSCOPY WITH RETROGRADE  PYELOGRAM, URETEROSCOPY AND STENT PLACEMENT Right 08/10/2016   Procedure: CYSTOSCOPY WITH RETROGRADE PYELOGRAM, URETEROSCOPY AND STENT PLACEMENT;  Surgeon: Alvaro Hummer, MD;  Location: [facility];  Service: Urology;  Laterality: Right;   FIXATION RIGHT WRIST Right 2011   stability for arthritis   INGUINAL HERNIA REPAIR Right 08-08-2015   [facility]- health care at [facility]   INGUINAL HERNIA REPAIR Left 10/31/2023   Procedure: REPAIR, HERNIA, INGUINAL, ADULT;  Surgeon: Kinsinger, Herlene Righter, MD;  [facility];  Service: General;  Laterality: Left;  GEN/TAP BLOCK   ROBOTIC ASSITED PARTIAL NEPHRECTOMY Left 02/04/2019   Procedure: XI  ROBOTIC ASSITED LEFT PARTIAL NEPHRECTOMY; LEFT PARTIAL ADRENALECTOMY;  Surgeon: Alvaro Hummer, MD;  Location[facility];  Service: Urology;  Laterality: Left;  3 HRS   ROTATOR CUFF REPAIR Right 2004;  2007   TOTAL KNEE ARTHROPLASTY Left 11/30/2020   Procedure: TOTAL KNEE ARTHROPLASTY;  Surgeon: Duwayne Purchase, MD;  Location[facility];  Service: Orthopedics;  Laterality: Left;   WISDOM TOOTH EXTRACTION      Current Medications: No outpatient medications have been marked as taking for the 03/06/24 encounter (Appointment) with Kate Lonni CROME, MD.     Allergies:   Patient has no known allergies.   Social History   Socioeconomic History   Marital status: Married    Spouse name: Not on file   Number of children: Not on file   Years of education: Not on file   Highest education level: Not on file  Occupational History   Occupation: retired  Tobacco Use   Smoking status: Never    Passive exposure: Past   Smokeless tobacco: Current    Types: Snuff   Tobacco comments:    03/16/2021 ~ dip tobacco for 30 yrs  Vaping Use   Vaping status: Never Used  Substance and Sexual Activity   Alcohol use: Not Currently    Comment: occ.    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 wife      Spiritual Beliefs: Christian      Lifestyle: tries to make it to the gym 5 days per week; healthy diet     -smoke alarm in the home:Yes     - wears seatbelt: Yes     - Feels safe in their relationships: Yes         Social Drivers of Health   Tobacco Use: High Risk (11/21/2023)   Received from [facility]   Patient History    Smoking Tobacco Use: Never    Smokeless Tobacco Use: Current    Passive Exposure: Not on file  Financial Resource Strain: Low Risk (09/11/2023)   Overall Financial Resource  Strain (CARDIA)    Difficulty of Paying Living Expenses: Not hard at all  Food Insecurity: No Food Insecurity (09/11/2023)   Epic    Worried About Radiation Protection Practitioner of Food in the Last Year: Never true    Ran Out of Food in the Last Year: Never true  Transportation Needs: No Transportation Needs (09/11/2023)   Epic    Lack of Transportation (Medical): No    Lack of Transportation (Non-Medical): No  Physical Activity: Sufficiently Active (09/11/2023)   Exercise Vital Sign    Days of Exercise per  Week: 4 days    Minutes of Exercise per Session: 50 min  Stress: No Stress Concern Present (09/11/2023)   [employer] of Occupational Health - Occupational Stress Questionnaire    Feeling of Stress: Not at all  Social Connections: Unknown (09/11/2023)   Social Connection and Isolation Panel    Frequency of Communication with Friends and Family: More than three times a week    Frequency of Social Gatherings with Friends and Family: More than three times a week    Attends Religious Services: Never    Database Administrator or Organizations: No    Attends Engineer, Structural: More than 4 times per year    Marital Status: Patient declined  Depression (PHQ2-9): Low Risk (09/11/2023)   Depression (PHQ2-9)    PHQ-2 Score: 0  Alcohol Screen: Low Risk (09/11/2023)   Alcohol Screen    Last Alcohol Screening Score (AUDIT): 0  Housing: Unknown (09/11/2023)   Epic    Unable to Pay for Housing in the Last Year: No    Number of Times Moved in the Last Year: Not on file    Homeless in the Last Year: No  Utilities: Not At Risk (09/11/2023)   Epic    Threatened with loss of utilities: No  Health Literacy: Adequate Health Literacy (09/11/2023)   B1300 Health Literacy    Frequency of need for help with medical instructions: Never     Family History: The patient's family history includes Alzheimer's disease in his mother; Cancer in his father; Heart attack in his mother. There is no history of  Hypertension.  ROS:   Please see the history of present illness.     All other systems reviewed and are negative.  EKGs/Labs/Other Studies Reviewed:    The following studies were reviewed today:   EKG:  EKG is not ordered today.   Recent Labs: 11/14/2023: ALT 17; BUN 23; Creatinine, Ser 1.35; Hemoglobin 11.9; Platelets 238; Potassium 4.9; Sodium 143; TSH 0.624  Recent Lipid Panel    Component Value Date/Time   HDL 49 05/20/2014 0000   LDLCALC 59 05/20/2014 0000    Physical Exam:    VS:  There were no vitals taken for this visit.    Wt Readings from Last 3 Encounters:  11/14/23 153 lb 3.2 oz (69.5 kg)  10/31/23 155 lb (70.3 kg)  10/14/23 147 lb (66.7 kg)     GEN:  Well nourished, well developed in no acute distress HEENT: Normal NECK: No JVD; No carotid bruits LYMPHATICS: No lymphadenopathy CARDIAC: RRR, no murmurs, rubs, gallops RESPIRATORY:  Clear to auscultation without rales, wheezing or rhonchi  ABDOMEN: Soft, non-tender, non-distended MUSCULOSKELETAL:  No edema; No deformity  SKIN: Warm and dry NEUROLOGIC:  Alert and oriented x 3 PSYCHIATRIC:  Normal affect   ASSESSMENT:    No diagnosis found.  PLAN:    CAD: s/p CABG in 2001.  Denies anginal symptoms - Continue Eliquis , statin  CKD stage IIIb: h/o Renal cancer status post left nephrectomy.  Creatinine 1.35 11/2023  Paroxysmal atrial fibrillation/flutter:status post ablation in 2006 and 2011. - Continue Eliquis .  Will update labs, if creatinine remains above reduce 1.5 will dose to 2.5 mg twice daily based on age/renal function - Continue amiodarone  100 mg daily.  Normal TSH, LFTs 11/2023  Orthostatic hypotension: Currently on midodrine  5 mg 3 times daily, will wean***  RTC in 4 months***  Medication Adjustments/Labs and Tests Ordered: Current medicines are reviewed at length with the patient today.  Concerns regarding medicines are outlined above.  No orders of the defined types were placed in this  encounter.  No orders of the defined types were placed in this encounter.   There are no Patient Instructions on file for this visit.   Signed, Lonni LITTIE Nanas, MD  03/01/2024 10:09 PM    [facility] "

## 2024-03-06 ENCOUNTER — Ambulatory Visit: Attending: Cardiology | Admitting: Cardiology

## 2024-03-06 VITALS — BP 172/80 | HR 53 | Ht 66.0 in | Wt 155.8 lb

## 2024-03-06 DIAGNOSIS — I951 Orthostatic hypotension: Secondary | ICD-10-CM

## 2024-03-06 DIAGNOSIS — I480 Paroxysmal atrial fibrillation: Secondary | ICD-10-CM

## 2024-03-06 DIAGNOSIS — N1832 Chronic kidney disease, stage 3b: Secondary | ICD-10-CM

## 2024-03-06 DIAGNOSIS — I2510 Atherosclerotic heart disease of native coronary artery without angina pectoris: Secondary | ICD-10-CM | POA: Diagnosis not present

## 2024-03-06 MED ORDER — MIDODRINE HCL 2.5 MG PO TABS: 2.5000 mg | ORAL_TABLET | Freq: Three times a day (TID) | ORAL | 5 refills | Status: AC

## 2024-03-06 NOTE — Patient Instructions (Signed)
 Medication Instructions:  Decrease Midodrine  2.5 mg three times a day *If you need a refill on your cardiac medications before your next appointment, please call your pharmacy*  Lab Work: none If you have labs (blood work) drawn today and your tests are completely normal, you will receive your results only by: MyChart Message (if you have MyChart) OR A paper copy in the mail If you have any lab test that is abnormal or we need to change your treatment, we will call you to review the results.  Testing/Procedures: none  Follow-Up: At [facility], you and your health needs are our priority.  As part of our continuing mission to provide you with exceptional heart care, our providers are all part of one team.  This team includes your primary Cardiologist (physician) and Advanced Practice Providers or APPs (Physician Assistants and Nurse Practitioners) who all work together to provide you with the care you need, when you need it.  Your next appointment:   3 months  Provider:   Dr. Kate   We recommend signing up for the patient portal called MyChart.  Sign up information is provided on this After Visit Summary.  MyChart is used to connect with patients for Virtual Visits (Telemedicine).  Patients are able to view lab/test results, encounter notes, upcoming appointments, etc.  Non-urgent messages can be sent to your provider as well.   To learn more about what you can do with MyChart, go to [email or web address].   Other Instructions none

## 2024-09-16 ENCOUNTER — Encounter
